# Patient Record
Sex: Female | Born: 1965 | Race: White | Hispanic: No | Marital: Married | Smoking: Never smoker
Health system: Southern US, Community
[De-identification: ages and names within clinical notes are randomized; demographics above are authoritative.]

## PROBLEM LIST (undated history)

## (undated) DIAGNOSIS — U071 COVID-19: Secondary | ICD-10-CM

## (undated) DIAGNOSIS — K760 Fatty (change of) liver, not elsewhere classified: Secondary | ICD-10-CM

## (undated) DIAGNOSIS — R112 Nausea with vomiting, unspecified: Secondary | ICD-10-CM

## (undated) DIAGNOSIS — E7800 Pure hypercholesterolemia, unspecified: Secondary | ICD-10-CM

## (undated) DIAGNOSIS — K5730 Diverticulosis of large intestine without perforation or abscess without bleeding: Secondary | ICD-10-CM

## (undated) DIAGNOSIS — K8020 Calculus of gallbladder without cholecystitis without obstruction: Secondary | ICD-10-CM

## (undated) DIAGNOSIS — Z9889 Other specified postprocedural states: Secondary | ICD-10-CM

## (undated) DIAGNOSIS — K769 Liver disease, unspecified: Secondary | ICD-10-CM

## (undated) DIAGNOSIS — M2550 Pain in unspecified joint: Secondary | ICD-10-CM

## (undated) DIAGNOSIS — T7840XA Allergy, unspecified, initial encounter: Secondary | ICD-10-CM

## (undated) DIAGNOSIS — F419 Anxiety disorder, unspecified: Secondary | ICD-10-CM

## (undated) DIAGNOSIS — K5792 Diverticulitis of intestine, part unspecified, without perforation or abscess without bleeding: Secondary | ICD-10-CM

## (undated) DIAGNOSIS — Z973 Presence of spectacles and contact lenses: Secondary | ICD-10-CM

## (undated) DIAGNOSIS — R61 Generalized hyperhidrosis: Secondary | ICD-10-CM

## (undated) DIAGNOSIS — M1990 Unspecified osteoarthritis, unspecified site: Secondary | ICD-10-CM

## (undated) DIAGNOSIS — R7303 Prediabetes: Secondary | ICD-10-CM

## (undated) HISTORY — PX: ANTERIOR CRUCIATE LIGAMENT REPAIR: SHX115

## (undated) HISTORY — DX: Pure hypercholesterolemia, unspecified: E78.00

## (undated) HISTORY — DX: Liver disease, unspecified: K76.9

## (undated) HISTORY — DX: Anxiety disorder, unspecified: F41.9

## (undated) HISTORY — DX: Allergy, unspecified, initial encounter: T78.40XA

## (undated) HISTORY — DX: Generalized hyperhidrosis: R61

## (undated) HISTORY — DX: Pain in unspecified joint: M25.50

## (undated) HISTORY — PX: DILATION AND CURETTAGE OF UTERUS: SHX78

## (undated) HISTORY — PX: KNEE ARTHROSCOPY: SUR90

## (undated) HISTORY — PX: PLANTAR FASCIECTOMY: SUR600

## (undated) HISTORY — PX: KNEE SURGERY: SHX244

## (undated) HISTORY — DX: Fatty (change of) liver, not elsewhere classified: K76.0

## (undated) HISTORY — PX: LAPAROSCOPIC SIGMOID COLECTOMY: SHX5928

## (undated) HISTORY — DX: Calculus of gallbladder without cholecystitis without obstruction: K80.20

## (undated) HISTORY — PX: MOUTH SURGERY: SHX715

## (undated) HISTORY — DX: Diverticulosis of large intestine without perforation or abscess without bleeding: K57.30

---
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
## 1998-06-04 ENCOUNTER — Other Ambulatory Visit: Admission: RE | Admit: 1998-06-04 | Discharge: 1998-06-04 | Payer: Self-pay | Admitting: Gynecology

## 1999-03-18 HISTORY — PX: DILATION AND CURETTAGE OF UTERUS: SHX78

## 1999-07-18 ENCOUNTER — Ambulatory Visit (HOSPITAL_COMMUNITY): Admission: RE | Admit: 1999-07-18 | Discharge: 1999-07-18 | Payer: Self-pay | Admitting: Gynecology

## 2000-01-03 ENCOUNTER — Encounter: Payer: Self-pay | Admitting: Obstetrics and Gynecology

## 2000-01-03 ENCOUNTER — Ambulatory Visit (HOSPITAL_COMMUNITY): Admission: RE | Admit: 2000-01-03 | Discharge: 2000-01-03 | Payer: Self-pay | Admitting: Obstetrics and Gynecology

## 2000-03-12 ENCOUNTER — Ambulatory Visit (HOSPITAL_COMMUNITY): Admission: RE | Admit: 2000-03-12 | Discharge: 2000-03-12 | Payer: Self-pay | Admitting: Obstetrics and Gynecology

## 2000-03-23 ENCOUNTER — Ambulatory Visit (HOSPITAL_COMMUNITY): Admission: RE | Admit: 2000-03-23 | Discharge: 2000-03-23 | Payer: Self-pay | Admitting: Obstetrics and Gynecology

## 2000-03-31 ENCOUNTER — Other Ambulatory Visit: Admission: RE | Admit: 2000-03-31 | Discharge: 2000-03-31 | Payer: Self-pay | Admitting: Obstetrics and Gynecology

## 2000-06-05 ENCOUNTER — Inpatient Hospital Stay (HOSPITAL_COMMUNITY): Admission: AD | Admit: 2000-06-05 | Discharge: 2000-06-07 | Payer: Self-pay | Admitting: Obstetrics and Gynecology

## 2001-11-01 ENCOUNTER — Emergency Department (HOSPITAL_COMMUNITY): Admission: EM | Admit: 2001-11-01 | Discharge: 2001-11-02 | Payer: Self-pay | Admitting: Emergency Medicine

## 2001-11-02 ENCOUNTER — Encounter: Payer: Self-pay | Admitting: Emergency Medicine

## 2001-11-05 ENCOUNTER — Other Ambulatory Visit: Admission: RE | Admit: 2001-11-05 | Discharge: 2001-11-05 | Payer: Self-pay | Admitting: Obstetrics and Gynecology

## 2004-05-07 ENCOUNTER — Other Ambulatory Visit: Admission: RE | Admit: 2004-05-07 | Discharge: 2004-05-07 | Payer: Self-pay | Admitting: Obstetrics and Gynecology

## 2011-02-22 ENCOUNTER — Ambulatory Visit (INDEPENDENT_AMBULATORY_CARE_PROVIDER_SITE_OTHER): Payer: Managed Care, Other (non HMO)

## 2011-02-22 DIAGNOSIS — Z130 Encounter for screening for diseases of the blood and blood-forming organs and certain disorders involving the immune mechanism: Secondary | ICD-10-CM

## 2011-02-22 DIAGNOSIS — S51009A Unspecified open wound of unspecified elbow, initial encounter: Secondary | ICD-10-CM

## 2011-02-22 DIAGNOSIS — Z23 Encounter for immunization: Secondary | ICD-10-CM

## 2012-03-26 ENCOUNTER — Ambulatory Visit (INDEPENDENT_AMBULATORY_CARE_PROVIDER_SITE_OTHER): Payer: Managed Care, Other (non HMO) | Admitting: Family Medicine

## 2012-03-26 ENCOUNTER — Encounter: Payer: Self-pay | Admitting: Family Medicine

## 2012-03-26 VITALS — BP 106/78 | HR 58 | Temp 97.5°F | Resp 16 | Ht 65.0 in | Wt 193.6 lb

## 2012-03-26 DIAGNOSIS — J301 Allergic rhinitis due to pollen: Secondary | ICD-10-CM

## 2012-03-26 DIAGNOSIS — Z23 Encounter for immunization: Secondary | ICD-10-CM

## 2012-03-26 DIAGNOSIS — Z0000 Encounter for general adult medical examination without abnormal findings: Secondary | ICD-10-CM

## 2012-03-26 LAB — CBC WITH DIFFERENTIAL/PLATELET
Basophils Absolute: 0.1 10*3/uL (ref 0.0–0.1)
Basophils Relative: 1 % (ref 0–1)
Eosinophils Absolute: 0.3 10*3/uL (ref 0.0–0.7)
Eosinophils Relative: 3 % (ref 0–5)
HCT: 40.3 % (ref 36.0–46.0)
Hemoglobin: 13.8 g/dL (ref 12.0–15.0)
Lymphocytes Relative: 18 % (ref 12–46)
Lymphs Abs: 1.8 10*3/uL (ref 0.7–4.0)
MCH: 29.2 pg (ref 26.0–34.0)
MCHC: 34.2 g/dL (ref 30.0–36.0)
MCV: 85.2 fL (ref 78.0–100.0)
Monocytes Absolute: 0.8 10*3/uL (ref 0.1–1.0)
Monocytes Relative: 8 % (ref 3–12)
Neutro Abs: 7 10*3/uL (ref 1.7–7.7)
Neutrophils Relative %: 70 % (ref 43–77)
Platelets: 282 10*3/uL (ref 150–400)
RBC: 4.73 MIL/uL (ref 3.87–5.11)
RDW: 13.9 % (ref 11.5–15.5)
WBC: 10 10*3/uL (ref 4.0–10.5)

## 2012-03-26 LAB — LIPID PANEL
Cholesterol: 194 mg/dL (ref 0–200)
HDL: 44 mg/dL (ref >39)
LDL Cholesterol: 123 mg/dL — ABNORMAL HIGH (ref 0–99)
Total CHOL/HDL Ratio: 4.4 Ratio
Triglycerides: 136 mg/dL (ref <150)
VLDL: 27 mg/dL (ref 0–40)

## 2012-03-26 LAB — COMPREHENSIVE METABOLIC PANEL
ALT: 18 U/L (ref 0–35)
AST: 18 U/L (ref 0–37)
Albumin: 4.6 g/dL (ref 3.5–5.2)
Alkaline Phosphatase: 67 U/L (ref 39–117)
BUN: 9 mg/dL (ref 6–23)
CO2: 27 mEq/L (ref 19–32)
Calcium: 9.8 mg/dL (ref 8.4–10.5)
Chloride: 98 mEq/L (ref 96–112)
Creat: 0.87 mg/dL (ref 0.50–1.10)
Glucose, Bld: 85 mg/dL (ref 70–99)
Potassium: 3.9 mEq/L (ref 3.5–5.3)
Sodium: 134 mEq/L — ABNORMAL LOW (ref 135–145)
Total Bilirubin: 0.5 mg/dL (ref 0.3–1.2)
Total Protein: 7.6 g/dL (ref 6.0–8.3)

## 2012-03-26 LAB — TSH: TSH: 2.109 u[IU]/mL (ref 0.350–4.500)

## 2012-03-26 MED ORDER — FLUTICASONE PROPIONATE 50 MCG/ACT NA SUSP: 2.0000 | Freq: Every day | NASAL | Status: DC

## 2012-03-26 NOTE — Progress Notes (Signed)
Subjective:    Patient ID: Sydney Alvarado, female    DOB: Mar 06, 1966, 47 y.o.   MRN: [id number]  HPI  Ms. Lotspeich is a pleasant 47 yo female here for a general exam for insurance purposes.  She is fasting today and has no complaints.   Sees gyn yearly- Dr. Senaida Ores - next appt with her is sched for April 2014.  She is still getting pap smears yearly and has no h/o abnormal.  Started mammogram at 47 yo due to exam abnmlity with nml [place] and has had yearly since then w/ no abnml - last was in August.  Is taking daily MVI.  Does get some dairy in through yogurt and cheese. TDaP was Dec 2011 and flu Dec 2012  Past Medical History  Diagnosis Date  . Allergy    Past Surgical History  Procedure Date  . Anterior cruciate ligament repair   Left acl repair x 2 and torn cartilage by Dr. Lajoyce Corners  Family History  Problem Relation Age of Onset  . Diabetes Mother   . Hypertension Father   . Hypertension Brother   . Hypertension Maternal Grandmother   . Heart disease Maternal Grandfather     heart attack  . Hypertension Paternal Grandmother   . Stroke Paternal Grandmother   . Cancer Paternal Grandfather   Mother w/ hypothyroid Pat GF w/ bone and stomach cancer at 45 yo. pgm HTN - brain aneurysmx x3 and CVA in 70s.  History  Substance Use Topics  . Smoking status: Never Smoker   . Smokeless tobacco: Not on file  . Alcohol Use: No  Work in Education officer, environmental. Has started going to the gym  Is married w/ 2 children - 1 yo son w/ severe allergies. [id number] - miscarriages.  Review of Systems  Constitutional: Negative.   HENT: Negative.        Ears itch from allergies  Eyes: Negative.   Respiratory: Negative.   Cardiovascular: Negative.   Gastrointestinal: Negative.   Genitourinary: Negative.   Musculoskeletal: Negative.   Neurological: Negative.   Hematological: Negative.   Psychiatric/Behavioral: Negative.   All other systems reviewed and are negative.      BP 106/78  Pulse 58  Temp 97.5 F  (36.4 C) (Oral)  Resp 16  Ht 5\' 5"  (1.651 m)  Wt 193 lb 9.6 oz (87.816 kg)  BMI 32.22 kg/m2  SpO2 98%  LMP 03/02/2012 Objective:   Physical Exam  Constitutional: She is oriented to person, place, and time. She appears well-developed and well-nourished. No distress.  HENT:  Head: Normocephalic and atraumatic.  Right Ear: Tympanic membrane, external ear and ear canal normal.  Left Ear: Tympanic membrane, external ear and ear canal normal.  Nose: Nose normal. No mucosal edema or rhinorrhea.  Mouth/Throat: Uvula is midline, oropharynx is clear and moist and mucous membranes are normal. No posterior oropharyngeal erythema.  Eyes: Conjunctivae normal and EOM are normal. Pupils are equal, round, and reactive to light. Right eye exhibits no discharge. Left eye exhibits no discharge. No scleral icterus.  Neck: Normal range of motion. Neck supple. No thyromegaly present.  Cardiovascular: Normal rate, regular rhythm, normal heart sounds and intact distal pulses.   Pulmonary/Chest: Effort normal and breath sounds normal. No respiratory distress.  Abdominal: Soft. Bowel sounds are normal. There is no tenderness.  Musculoskeletal: She exhibits no edema.  Lymphadenopathy:    She has no cervical adenopathy.  Neurological: She is alert and oriented to person, place, and time. She has normal reflexes.  She displays normal reflexes. She exhibits normal muscle tone. Coordination normal.  Skin: Skin is warm and dry. She is not diaphoretic. No erythema.  Psychiatric: She has a normal mood and affect. Her behavior is normal.       Assessment & Plan:  Allergies - refill flonase. If you want referral for immunotherapy, call. We will want copies of labs sent to Dr. Senaida Ores. HM - cbc, lipid, tsh, cmp.  Rec to pt that she is ok to spread out pap smears to 3 yr or 5 yrs w/ HPV co-testing. Rec spreading out mammograms to every other yr.  Cont mvi for ca and vit D supp. Cont exercise.

## 2012-03-26 NOTE — Progress Notes (Deleted)
  Subjective:    Patient ID: Sydney Alvarado, female    DOB: 1965/09/03, 47 y.o.   MRN: [id number]  HPI    Review of Systems  Constitutional: Negative.   HENT: Negative.   Eyes: Negative.   Respiratory: Negative.   Cardiovascular: Negative.   Gastrointestinal: Negative.   Genitourinary: Negative.   Musculoskeletal: Negative.   Skin: Negative.   Neurological: Negative.   Hematological: Negative.   Psychiatric/Behavioral: Negative.        Objective:   Physical Exam        Assessment & Plan:

## 2013-01-20 ENCOUNTER — Other Ambulatory Visit: Payer: Self-pay

## 2013-06-25 ENCOUNTER — Emergency Department (HOSPITAL_BASED_OUTPATIENT_CLINIC_OR_DEPARTMENT_OTHER)
Admission: EM | Admit: 2013-06-25 | Discharge: 2013-06-25 | Disposition: A | Discharge status: Home / Self Care | Payer: Managed Care, Other (non HMO) | Attending: Emergency Medicine | Admitting: Emergency Medicine

## 2013-06-25 ENCOUNTER — Emergency Department (HOSPITAL_BASED_OUTPATIENT_CLINIC_OR_DEPARTMENT_OTHER): Payer: Managed Care, Other (non HMO)

## 2013-06-25 ENCOUNTER — Encounter (HOSPITAL_BASED_OUTPATIENT_CLINIC_OR_DEPARTMENT_OTHER): Payer: Self-pay | Admitting: Emergency Medicine

## 2013-06-25 DIAGNOSIS — R209 Unspecified disturbances of skin sensation: Secondary | ICD-10-CM | POA: Insufficient documentation

## 2013-06-25 DIAGNOSIS — IMO0002 Reserved for concepts with insufficient information to code with codable children: Secondary | ICD-10-CM | POA: Insufficient documentation

## 2013-06-25 DIAGNOSIS — M25579 Pain in unspecified ankle and joints of unspecified foot: Secondary | ICD-10-CM | POA: Insufficient documentation

## 2013-06-25 DIAGNOSIS — Z87828 Personal history of other (healed) physical injury and trauma: Secondary | ICD-10-CM | POA: Insufficient documentation

## 2013-06-25 DIAGNOSIS — M25569 Pain in unspecified knee: Secondary | ICD-10-CM

## 2013-06-25 DIAGNOSIS — Z79899 Other long term (current) drug therapy: Secondary | ICD-10-CM | POA: Insufficient documentation

## 2013-06-25 DIAGNOSIS — Z9889 Other specified postprocedural states: Secondary | ICD-10-CM | POA: Insufficient documentation

## 2013-06-25 MED ORDER — OXYCODONE-ACETAMINOPHEN 5-325 MG PO TABS
1.0000 | ORAL_TABLET | Freq: Once | ORAL | Status: AC
  Administered 2013-06-25: 1 via ORAL
  Filled 2013-06-25: qty 1

## 2013-06-25 MED ORDER — OXYCODONE-ACETAMINOPHEN 5-325 MG PO TABS: 1.0000 | ORAL_TABLET | Freq: Four times a day (QID) | ORAL | Status: DC | PRN

## 2013-06-25 NOTE — ED Notes (Signed)
Pt reports hx of left knee acl repair in past- today was putting her legs up on couch and feels like something "went out"- states cannot bear weight or straighten leg

## 2013-06-25 NOTE — ED Provider Notes (Signed)
CSN: [id number]     Arrival date & time 06/25/13  1703 History  This chart was scribed for Threasa Beards, MD by Delphia Grates, ED Scribe. This patient was seen in room MH11/MH11 and the patient's care was started at [facility] PM.  Chief Complaint  Patient presents with  . Knee Pain    Patient is a 48 y.o. female presenting with knee pain. The history is provided by the patient. No language interpreter was used.  Knee Pain Location:  Knee Knee location:  L knee Pain details:    Quality:  Shooting   Severity:  Moderate   Timing:  Constant   Progression:  Unchanged Chronicity:  Recurrent Dislocation: no   Relieved by:  Immobilization Worsened by:  Flexion, activity and bearing weight Associated symptoms: decreased ROM, numbness and tingling   Associated symptoms: no fatigue and no fever   Risk factors: no known bone disorder and no obesity     HPI Comments: Sydney Alvarado is a 48 y.o. female with history of anterior cruciate ligament tear and surgical repair who presents to the Emergency Department complaining of constant, moderate, shooting anterior left knee pain that radiates down to her ankle. Patient states that pain began when she lifted her legs up off the floor to the couch. She reports history of similar symptoms and states the left knee "feels like it is out of place." There is associated numbness on the left side of her knee. She states that the pain is relieved with compression and worsened by flexion. She denies weakness in her extremities. Patient says that her last ACL surgery was 15-16 years ago with Dr. Sharol Given. She has not followed up with an orthopedist regarding her recurrent knee pain since that time. She is a nonsmoker and does not consume alcohol. She is allergic to sulfa drugs.  Past Medical History  Diagnosis Date  . Allergy    Past Surgical History  Procedure Laterality Date  . Anterior cruciate ligament repair    . Dilation and curettage of uterus    . Mouth  surgery     Family History  Problem Relation Age of Onset  . Diabetes Mother   . Hypertension Father   . Hypertension Brother   . Hypertension Maternal Grandmother   . Heart disease Maternal Grandfather     heart attack  . Hypertension Paternal Grandmother   . Stroke Paternal Grandmother   . Cancer Paternal Grandfather    History  Substance Use Topics  . Smoking status: Never Smoker   . Smokeless tobacco: Never Used  . Alcohol Use: No   OB History   Grav Para Term Preterm Abortions TAB SAB Ect Mult Living                 Review of Systems  Constitutional: Negative for fever and fatigue.  Musculoskeletal: Positive for arthralgias.  Neurological: Positive for numbness. Negative for weakness.  All other systems reviewed and are negative.   Allergies  Sulfa antibiotics  Home Medications   Current Outpatient Rx  Name  Route  Sig  Dispense  Refill  . mometasone (NASONEX) 50 MCG/ACT nasal spray   Nasal   Place 2 sprays into the nose daily.         . fexofenadine (ALLEGRA) 180 MG tablet   Oral   Take 180 mg by mouth daily.         . fluticasone (FLONASE) 50 MCG/ACT nasal spray   Nasal   Place 2  sprays into the nose daily.   16 g   11   . Multiple Vitamin (MULTIVITAMIN) tablet   Oral   Take 1 tablet by mouth daily.          Triage Vitals: BP 128/77  Pulse 73  Temp(Src) 98.1 F (36.7 C) (Oral)  Resp 24  Ht 5\' 5"  (1.651 m)  Wt 193 lb (87.544 kg)  BMI 32.12 kg/m2  SpO2 97%  LMP 06/11/2013 Physical Exam  Nursing note and vitals reviewed. Constitutional: She is oriented to person, place, and time. She appears well-developed and well-nourished. No distress.  HENT:  Head: Normocephalic and atraumatic.  Eyes: EOM are normal.  Neck: Neck supple. No tracheal deviation present.  Cardiovascular: Normal rate.   Pulmonary/Chest: Effort normal. No respiratory distress.  Musculoskeletal:  Well healed vertical surgical scar over the anterior left knee.  Tenderness to palpation diffusely of anterior knee. Holding knee in flexion. Mild laxity with anterior drawer testing. Left leg is distally neurovascularly intact.  Neurological: She is alert and oriented to person, place, and time.  Skin: Skin is warm and dry.  Psychiatric: She has a normal mood and affect. Her behavior is normal.    ED Course  Procedures (including critical care time) DIAGNOSTIC STUDIES: Oxygen Saturation is 97% on RA, normal by my interpretation.    COORDINATION OF CARE: 6:20 PM- Pt advised of plan for treatment and pt agrees.  Imaging Review Dg Knee Complete 4 Views Left  06/25/2013   CLINICAL DATA:  KNEE PAIN  EXAM: LEFT KNEE - COMPLETE 4+ VIEW  COMPARISON:  None.  FINDINGS: The left knee is bent limiting evaluation. With the positioning of the knee is not NPO evidence dislocation. There is no evidence of fracture. Patient is status post ACL repair.  IMPRESSION: In proper positioning due to limited range of motion. There does is not appear to be evidence of acute fracture nor dislocation.   Electronically Signed   By: Margaree Mackintosh M.D.   On: 06/25/2013 17:58    MDM   Final diagnoses:  Knee pain    Pt presenting with c/o knee pain- she has hx of ACL repair several years ago, pain onset with twisting movement- no significant trauma.  Xray reassuring.  May be ligamentous in nature.  Pt placed in knee immobilizer, given pain control. Pt states she brought crutches in the car with her.  Given information for ortho followup.  Discharged with strict return precautions.  Pt agreeable with plan.  I personally performed the services described in this documentation, which was scribed in my presence. The recorded information has been reviewed and is accurate.    Threasa Beards, MD 06/27/13 [phone]

## 2013-06-25 NOTE — Discharge Instructions (Signed)
Return to the ED with any concerns including increased pain, swelling/discoloration/numbness of leg, or any other alarming symptoms

## 2013-06-25 NOTE — ED Notes (Signed)
IV charted on incorrect patient

## 2014-03-17 HISTORY — PX: MOUTH SURGERY: SHX715

## 2015-02-01 ENCOUNTER — Encounter: Payer: Self-pay | Admitting: Physician Assistant

## 2015-02-01 ENCOUNTER — Ambulatory Visit (INDEPENDENT_AMBULATORY_CARE_PROVIDER_SITE_OTHER): Payer: Managed Care, Other (non HMO) | Admitting: Physician Assistant

## 2015-02-01 VITALS — BP 107/74 | HR 60 | Temp 98.4°F | Resp 17 | Ht 65.0 in | Wt 195.0 lb

## 2015-02-01 DIAGNOSIS — Z23 Encounter for immunization: Secondary | ICD-10-CM

## 2015-02-01 DIAGNOSIS — L0390 Cellulitis, unspecified: Secondary | ICD-10-CM

## 2015-02-01 DIAGNOSIS — L0291 Cutaneous abscess, unspecified: Secondary | ICD-10-CM | POA: Diagnosis not present

## 2015-02-01 MED ORDER — DOXYCYCLINE HYCLATE 100 MG PO CAPS: 100.0000 mg | ORAL_CAPSULE | Freq: Two times a day (BID) | ORAL | Status: AC

## 2015-02-01 NOTE — Patient Instructions (Signed)
Please take the antibiotic as prescribed. Remove the packing in 48 hours.  If the bandages get wet within the 48 hours, please remove the top dressing and place another dressing.  Try to leave the packing there. I will contact you with the result of the wound culture.  Please return if you are having any of the alarming symptoms listed below.  Incision and Drainage Incision and drainage is a procedure in which a sac-like structure (cystic structure) is opened and drained. The area to be drained usually contains material such as pus, fluid, or blood.  LET YOUR CAREGIVER KNOW ABOUT:   Allergies to medicine.  Medicines taken, including vitamins, herbs, eyedrops, over-the-counter medicines, and creams.  Use of steroids (by mouth or creams).  Previous problems with anesthetics or numbing medicines.  History of bleeding problems or blood clots.  Previous surgery.  Other health problems, including diabetes and kidney problems.  Possibility of pregnancy, if this applies. RISKS AND COMPLICATIONS  Pain.  Bleeding.  Scarring.  Infection. BEFORE THE PROCEDURE  You may need to have an ultrasound or other imaging tests to see how large or deep your cystic structure is. Blood tests may also be used to determine if you have an infection or how severe the infection is. You may need to have a tetanus shot. PROCEDURE  The affected area is cleaned with a cleaning fluid. The cyst area will then be numbed with a medicine (local anesthetic). A small incision will be made in the cystic structure. A syringe or catheter may be used to drain the contents of the cystic structure, or the contents may be squeezed out. The area will then be flushed with a cleansing solution. After cleansing the area, it is often gently packed with a gauze or another wound dressing. Once it is packed, it will be covered with gauze and tape or some other type of wound dressing. AFTER THE PROCEDURE   Often, you will be allowed  to go home right after the procedure.  You may be given antibiotic medicine to prevent or heal an infection.  If the area was packed with gauze or some other wound dressing, you will likely need to come back in 1 to 2 days to get it removed.  The area should heal in about 14 days.   This information is not intended to replace advice given to you by your health care provider. Make sure you discuss any questions you have with your health care provider.   Document Released: 08/27/2000 Document Revised: 09/02/2011 Document Reviewed: 04/28/2011 Elsevier Interactive Patient Education [employer].

## 2015-02-02 NOTE — Progress Notes (Signed)
[facility] [address], [place] [address] [phone]  Date:  02/01/2015   Name:  DISHA DEMPSEY   DOB:  Nov 26, 1965   MRN:  [id number]  PCP:  Logan Bores, MD    History of Present Illness:  MUBINA PRONOVOST is a 49 y.o. female patient who presents to [facility] for cc of bump at the right axilla.  Started on Monday as a small bump that was tender.  She has applied heat daily.  The bump has progressively worsened with increased swelling, and redness.  She has no fever, nausea, dizziness, or drainage.    There are no active problems to display for this patient.   Past Medical History  Diagnosis Date  . Allergy     Past Surgical History  Procedure Laterality Date  . Anterior cruciate ligament repair    . Dilation and curettage of uterus    . Mouth surgery      Social History  Substance Use Topics  . Smoking status: Never Smoker   . Smokeless tobacco: Never Used  . Alcohol Use: No    Family History  Problem Relation Age of Onset  . Diabetes Mother   . Hypertension Father   . Hypertension Brother   . Hypertension Maternal Grandmother   . Heart disease Maternal Grandfather     heart attack  . Hypertension Paternal Grandmother   . Stroke Paternal Grandmother   . Cancer Paternal Grandfather     Allergies  Allergen Reactions  . Sulfa Antibiotics     Type of reaction unknown    Medication list has been reviewed and updated.  Current Outpatient Prescriptions on File Prior to Visit  Medication Sig Dispense Refill  . fluticasone (FLONASE) 50 MCG/ACT nasal spray Place 2 sprays into the nose daily. 16 g 11  . Multiple Vitamin (MULTIVITAMIN) tablet Take 1 tablet by mouth daily.    . fexofenadine (ALLEGRA) 180 MG tablet Take 180 mg by mouth daily.    . mometasone (NASONEX) 50 MCG/ACT nasal spray Place 2 sprays into the nose daily.     No current facility-administered medications on file prior to visit.    ROS ROS otherwise unremarkable  unless listed above.  Physical Examination: BP 107/74 mmHg  Pulse 60  Temp(Src) 98.4 F (36.9 C) (Oral)  Resp 17  Ht 5\' 5"  (1.651 m)  Wt 195 lb (88.451 kg)  BMI 32.45 kg/m2  SpO2 97%  LMP 01/08/2015 Ideal Body Weight: Weight in (lb) to have BMI = 25: 149.9  Physical Exam  Constitutional: She is oriented to person, place, and time. She appears well-developed and well-nourished. No distress.  HENT:  Head: Normocephalic and atraumatic.  Right Ear: External ear normal.  Left Ear: External ear normal.  Eyes: Conjunctivae and EOM are normal. Pupils are equal, round, and reactive to light.  Cardiovascular: Normal rate.   Pulmonary/Chest: Effort normal. No respiratory distress.  Neurological: She is alert and oriented to person, place, and time.  Skin: She is not diaphoretic.  Right axilla with erythematous mildly fluctuant mobile swelling.  No expression with palpation.  No axillary lymphadenopathy.  Psychiatric: She has a normal mood and affect. Her behavior is normal.   Procedure: verbal consent obtained. Cleansed with alcohol swab.  1% lidocaine at axilla.  Cleansed with povidine.  11 blade used to incise wound.  Drainage expressed.  With continuous palpation, sebaceous material expressed, with sac products.  Searched with forceps.  1/4 packing placed.  Cleansed  with saline.  Dressings applied.   Assessment and Plan: JEANETT PERRY is a 49 y.o. female who is here today with right axilla bump.  Consistent with abscess.  This appears to be a sebaceous cyst that was infected.  This was removed. Wound culture obtained. Advised i and d care.  Remove the packing in 48 hours.  Cleansing with non-deodorant soap and water.  Alarming sxs to warrant return discussed.  Patient voiced understanding.  Abscess and cellulitis - Plan: doxycycline (VIBRAMYCIN) 100 MG capsule, Wound culture  Need for prophylactic vaccination and inoculation against influenza - Plan: Flu Vaccine QUAD 36+ mos  IM  Ivar Drape, PA-C [facility] 02/02/2015 5:56 AM

## 2015-02-04 LAB — WOUND CULTURE
GRAM STAIN: NONE SEEN
Gram Stain: NONE SEEN
ORGANISM ID, BACTERIA: NO GROWTH

## 2015-03-01 ENCOUNTER — Encounter: Payer: Self-pay | Admitting: Allergy and Immunology

## 2015-03-01 ENCOUNTER — Ambulatory Visit (INDEPENDENT_AMBULATORY_CARE_PROVIDER_SITE_OTHER): Payer: Managed Care, Other (non HMO) | Admitting: Allergy and Immunology

## 2015-03-01 VITALS — BP 110/80 | HR 75 | Temp 98.0°F | Resp 16 | Ht 64.17 in | Wt 192.9 lb

## 2015-03-01 DIAGNOSIS — J310 Chronic rhinitis: Secondary | ICD-10-CM

## 2015-03-01 DIAGNOSIS — R05 Cough: Secondary | ICD-10-CM

## 2015-03-01 DIAGNOSIS — R059 Cough, unspecified: Secondary | ICD-10-CM

## 2015-03-01 MED ORDER — OLOPATADINE HCL 0.6 % NA SOLN: 2.0000 | Freq: Every day | NASAL | Status: DC

## 2015-03-01 NOTE — Patient Instructions (Signed)
Take Home Sheet  1. Avoidance: Mite   2. Antihistamine: Zyrtec or Allegra by mouth once daily for runny nose as needed.   3. Nasal Spray: Flonase 2 spray(s) each nostril once daily for stuffy nose or drainage each morning.      Patanase 2 sprays each nostril once daily each evening for congestion.  4. Nasal Saline wash followed by nasal spray twice daily as directed.   5. Follow up Visit: 2 months or sooner if needed.   Websites that have reliable Patient information: 1. American Academy of Asthma, Allergy, & Immunology: www.aaaai.org 2. Food Allergy Network: www.foodallergy.org 3. Mothers of Asthmatics: www.aanma.org 4. [place]: DiningCalendar.de 5. American College of Allergy, Asthma, & Immunology: https://robertson.info/ or www.acaai.org

## 2015-03-01 NOTE — Progress Notes (Signed)
NEW PATIENT NOTE  RE: Sydney Alvarado MRN: [id number] DOB: [birth year] [facility] [address] [place] [address] Date of Office Visit: 03/01/2015  Referring provider: Paula Compton, MD [address], [place] [address]  Subjective:  Sydney Alvarado is a 49 y.o. female who presents today for Allergies  Assessment:   1. Chronic rhinitis, negative, selective aeroallergen testing today.   2. Cough, rare with normal lung exam.   Plan:   Meds ordered this encounter  Medications  . Olopatadine HCl 0.6 % SOLN    Sig: Place 2 drops (2 puffs total) into both nostrils daily.    Dispense:  3 Bottle    Refill:  1   Patient Instructions  1. Avoidance: Mite 2. Antihistamine: Zyrtec or Allegra by mouth once daily for runny nose as needed. 3. Nasal Spray: Flonase 2 spray(s) each nostril once daily for stuffy nose or drainage each morning.      Patanase 2 sprays each nostril once daily each evening for congestion. 4. Nasal Saline wash followed by nasal spray twice daily as directed. 5. Follow up Visit: 2 months or sooner if needed.  HPI: [name] presents with a 40 year history of mild spring nasal symptoms.  However, in the last 5 years,  symptoms have increased to year-round, including rhinorrhea, congestion, sneezing, itchy watery eyes, ear itching and pressure with postnasal drip, sinus pressure, and rare cough.  She reports snoring, having episodes of itchy throat without reflux, sinus infections or food/irritant sensitivities.  She typically describes dust, pollen, outdoors and fluctuant weather patterns are provoking factors for her symptoms.  Denies any systemic steroids, ED visits, hospitalizations, exercise or nocturnal difficulty.  Medications have been a partial benefit--Zyrtec and Flonase whereas Claritin was not beneficial.  And reports course of antibiotics in November.  Medical History: Past Medical History  Diagnosis Date  .  Allergy    Surgical History: Past Surgical History  Procedure Laterality Date  . Anterior cruciate ligament repair    . Dilation and curettage of uterus    . Mouth surgery     Family History: Family History  Problem Relation Age of Onset  . Diabetes Mother   . Hypertension Father   . Hypertension Brother   . Hypertension Maternal Grandmother   . Heart disease Maternal Grandfather     heart attack  . Hypertension Paternal Grandmother   . Stroke Paternal Grandmother   . Cancer Paternal Grandfather    Social History: Social History  . Marital Status: Married    Spouse Name: N/A  . Number of Children: N/A  . Years of Education: N/A   Occupational History  . Not on file.   Social History Main Topics  . Smoking status: Never Smoker   . Smokeless tobacco: Never Used  . Alcohol Use: No  . Drug Use: No  . 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  Medications prior to this encounter: Outpatient Prescriptions Prior to Visit  Medication Sig Dispense Refill  . fluticasone (FLONASE) 50 MCG/ACT nasal spray Place 2 sprays into the nose daily. 16 g 11  . Multiple Vitamin (MULTIVITAMIN) tablet Take 1 tablet by mouth daily.    . fexofenadine (ALLEGRA) 180 MG tablet Take 180 mg by mouth daily. Reported on 03/01/2015    . loratadine (CLARITIN) 10 MG tablet Take 10  mg by mouth daily. Reported on 03/01/2015    . mometasone (NASONEX) 50 MCG/ACT nasal spray Place 2 sprays into the nose daily. Reported on 03/01/2015     No facility-administered medications prior to visit.   Drug Allergies: Allergies  Allergen Reactions  . Sulfa Antibiotics     Type of reaction unknown   Environmental History: [name] lives in a 47 year old house 16 years with word, tile and carpeted floors, central air and heat without humidifier, or smokers.  Indoor dog.  Stuffed mattress  non-feather pillow and comforter.  Review of Systems  Constitutional: Negative for fever, weight loss and malaise/fatigue.       History of shingles.  HENT: Positive for congestion. Negative for ear pain, hearing loss, nosebleeds and sore throat.   Eyes: Negative for discharge and redness.       Reading glasses.  Respiratory: Negative for shortness of breath.        Denies history of pneumonia , and rare history of bronchitis..  Gastrointestinal: Negative for heartburn, nausea, vomiting, abdominal pain, diarrhea and constipation.  Genitourinary: Negative.   Musculoskeletal: Negative for myalgias and joint pain.  Skin: Negative.  Negative for itching and rash.  Neurological: Negative.  Negative for dizziness, seizures, weakness and headaches.  Endo/Heme/Allergies: Positive for environmental allergies.       Denies sensitivity to aspirin, NSAIDs, stinging insects, foods, latex, jewelry and cosmetics.    Objective:   Filed Vitals:   03/01/15 1005  BP: 110/80  Pulse: 75  Temp: 98 F (36.7 C)  Resp: 16   Physical Exam  Constitutional: She is well-developed, well-nourished, and in no distress.  Alert interactive communicating easily with nasal voice.  HENT:  Head: Atraumatic.  Right Ear: Tympanic membrane and ear canal normal.  Left Ear: Tympanic membrane and ear canal normal.  Nose: Mucosal edema present. No rhinorrhea. No epistaxis.  Mouth/Throat: Oropharynx is clear and moist and mucous membranes are normal. No oropharyngeal exudate, posterior oropharyngeal edema or posterior oropharyngeal erythema.  Eyes: Conjunctivae are normal.  Neck: Neck supple.  Cardiovascular: Normal rate, S1 normal and S2 normal.   No murmur heard. Pulmonary/Chest: Effort normal. She has no wheezes. She has no rhonchi. She has no rales.  Abdominal: Soft. Normal appearance and bowel sounds are normal.  Musculoskeletal: She exhibits no edema.  Lymphadenopathy:    She has no cervical adenopathy.   Neurological: She is alert.  Skin: Skin is warm and intact. No rash noted. No cyanosis. Nails show no clubbing.   Diagnostics: Skin testing: Negative to grass, weed and tree pollens, mold species and indoor inhalants as well selected foods.    Roselyn M. Ishmael Holter, MD   cc: Logan Bores, MD

## 2015-06-12 ENCOUNTER — Encounter: Payer: Self-pay | Admitting: Family Medicine

## 2015-06-12 ENCOUNTER — Ambulatory Visit (INDEPENDENT_AMBULATORY_CARE_PROVIDER_SITE_OTHER): Payer: Managed Care, Other (non HMO) | Admitting: Family Medicine

## 2015-06-12 VITALS — BP 123/81 | HR 55 | Temp 98.3°F | Resp 16 | Ht 64.75 in | Wt 196.2 lb

## 2015-06-12 DIAGNOSIS — Z6832 Body mass index (BMI) 32.0-32.9, adult: Secondary | ICD-10-CM | POA: Diagnosis not present

## 2015-06-12 DIAGNOSIS — Z131 Encounter for screening for diabetes mellitus: Secondary | ICD-10-CM

## 2015-06-12 DIAGNOSIS — Z114 Encounter for screening for human immunodeficiency virus [HIV]: Secondary | ICD-10-CM | POA: Diagnosis not present

## 2015-06-12 DIAGNOSIS — Z23 Encounter for immunization: Secondary | ICD-10-CM

## 2015-06-12 DIAGNOSIS — Z1329 Encounter for screening for other suspected endocrine disorder: Secondary | ICD-10-CM

## 2015-06-12 DIAGNOSIS — Z0000 Encounter for general adult medical examination without abnormal findings: Secondary | ICD-10-CM | POA: Diagnosis not present

## 2015-06-12 DIAGNOSIS — Z13220 Encounter for screening for lipoid disorders: Secondary | ICD-10-CM

## 2015-06-12 DIAGNOSIS — E669 Obesity, unspecified: Secondary | ICD-10-CM

## 2015-06-12 LAB — POCT URINALYSIS DIP (MANUAL ENTRY)
BILIRUBIN UA: NEGATIVE
Glucose, UA: NEGATIVE
Ketones, POC UA: NEGATIVE
Leukocytes, UA: NEGATIVE
Nitrite, UA: NEGATIVE
Protein Ur, POC: NEGATIVE
RBC UA: NEGATIVE
SPEC GRAV UA: 1.02
UROBILINOGEN UA: 0.2
pH, UA: 8.5

## 2015-06-12 LAB — CBC WITH DIFFERENTIAL/PLATELET
Basophils Absolute: 0.1 10*3/uL (ref 0.0–0.1)
Basophils Relative: 1 % (ref 0–1)
EOS PCT: 4 % (ref 0–5)
Eosinophils Absolute: 0.4 10*3/uL (ref 0.0–0.7)
HEMATOCRIT: 41.5 % (ref 36.0–46.0)
Hemoglobin: 13.8 g/dL (ref 12.0–15.0)
Lymphocytes Relative: 19 % (ref 12–46)
Lymphs Abs: 1.7 10*3/uL (ref 0.7–4.0)
MCH: 28.8 pg (ref 26.0–34.0)
MCHC: 33.3 g/dL (ref 30.0–36.0)
MCV: 86.5 fL (ref 78.0–100.0)
MONO ABS: 0.5 10*3/uL (ref 0.1–1.0)
MPV: 13.3 fL — AB (ref 8.6–12.4)
Monocytes Relative: 6 % (ref 3–12)
NEUTROS ABS: 6.4 10*3/uL (ref 1.7–7.7)
Neutrophils Relative %: 70 % (ref 43–77)
Platelets: 264 10*3/uL (ref 150–400)
RBC: 4.8 MIL/uL (ref 3.87–5.11)
RDW: 14 % (ref 11.5–15.5)
WBC: 9.1 10*3/uL (ref 4.0–10.5)

## 2015-06-12 MED ORDER — FLUOCINOLONE ACETONIDE 0.01 % OT OIL: 1.0000 "application " | TOPICAL_OIL | Freq: Two times a day (BID) | OTIC | Status: DC

## 2015-06-12 NOTE — Patient Instructions (Addendum)
IF you received an x-ray today, you will receive an invoice from [facility]. Please contact [facility] at [phone] with questions or concerns regarding your invoice.   IF you received labwork today, you will receive an invoice from [employer]. Please contact [facility] at [phone] with questions or concerns regarding your invoice.   Our billing staff will not be able to assist you with questions regarding bills from these companies.  You will be contacted with the lab results as soon as they are available. The fastest way to get your results is to activate your My Chart account. Instructions are located on the last page of this paperwork. If you have not heard from [place] regarding the results in 2 weeks, please contact this office.     [email or web address]  Keeping You Healthy  Get These Tests 1. Blood Pressure- Have your blood pressure checked once a year by your health care provider.  Normal blood pressure is 120/80. 2. Weight- Have your body mass index (BMI) calculated to screen for obesity.  BMI is measure of body fat based on height and weight.  You can also calculate your own BMI at GravelBags.it. 3. Cholesterol- Have your cholesterol checked every 5 years starting at age 63 then yearly starting at age 62. 21. Chlamydia, HIV, and other sexually transmitted diseases- Get screened every year until age 95, then within three months of each new sexual provider. 5. Pap Test - Every 1-5 years; discuss with your health care provider. 6. Mammogram- Every 1-2 years starting at age 6--50  Take these medicines  Calcium with Vitamin D-Your body needs 1200 mg of Calcium each day and [id number] IU of Vitamin D daily.  Your body can only absorb 500 mg of Calcium at a time so Calcium must be taken in 2 or 3 divided doses throughout the day.  Multivitamin with folic acid- Once daily if it is possible for you to become pregnant.  Get  these Immunizations  Gardasil-Series of three doses; prevents HPV related illness such as genital warts and cervical cancer.  Menactra-Single dose; prevents meningitis.  Tetanus shot- Every 10 years.  Flu shot-Every year.  Take these steps 1. Do not smoke-Your healthcare provider can help you quit.  For tips on how to quit go to www.smokefree.gov or call 1-800 QUITNOW. 2. Be physically active- Exercise 5 days a week for at least 30 minutes.  If you are not already physically active, start slow and gradually work up to 30 minutes of moderate physical activity.  Examples of moderate activity include walking briskly, dancing, swimming, bicycling, etc. 3. Breast Cancer- A self breast exam every month is important for early detection of breast cancer.  For more information and instruction on self breast exams, ask your healthcare provider or https://www.patel.info/. 4. Eat a healthy diet- Eat a variety of healthy foods such as fruits, vegetables, whole grains, low fat milk, low fat cheeses, yogurt, lean meats, poultry and fish, beans, nuts, tofu, etc.  For more information go to www. Thenutritionsource.org 5. Drink alcohol in moderation- Limit alcohol intake to one drink or less per day. Never drink and drive. 6. Depression- Your emotional health is as important as your physical health.  If you're feeling down or losing interest in things you normally enjoy please talk to your healthcare provider about being screened for depression. 7. Dental visit- Brush and floss your teeth twice daily; visit your dentist twice a year. 8. Eye doctor- Get an eye exam at  least every 2 years. 9. Helmet use- Always wear a helmet when riding a bicycle, motorcycle, rollerblading or skateboarding. 69. Safe sex- If you may be exposed to sexually transmitted infections, use a condom. 11. Seat belts- Seat belts can save your live; always wear one. 12. Smoke/Carbon Monoxide detectors- These detectors  need to be installed on the appropriate level of your home. Replace batteries at least once a year. 13. Skin cancer- When out in the sun please cover up and use sunscreen 15 SPF or higher. 14. Violence- If anyone is threatening or hurting you, please tell your healthcare provider.

## 2015-06-12 NOTE — Progress Notes (Signed)
Subjective:    Patient ID: Sydney Alvarado, female    DOB: Dec 21, 1965, 50 y.o.   MRN: [id number]  06/12/2015  Annual Exam   HPI This 50 y.o. female presents for Complete Physical Examination.  Last physical:  2-14 Pap smear:  2014 gynecology; [facility];  [facility]; regular menses; every 33 days. Mammogram:  02/2015 at [facility].  Subcutaneous cyst. Colonoscopy: never Bone density:  Never but wants to discussed.  TDAP:  Not sure.   Influenza:  02-01-2015 Eye exam:  Last year; reading glasses.   Dental exam:  Every six months.    Grandmother with osteoprosis; maternal grandmother is 53 years old; mother with osteoporosis; brother with hip resurfacing and pre-osteoporosis.  Has discussed with gynecologist.  Lack of weight loss: can exercise five days per week; will take three months to lose 8 pounds.    Blood pressure: 114-117/65-74. Gets anxious when comes to office.  B: sweet tea 18 ounces Snack: apple or Belvita Lunch: Lean Cuisine or protein shake, water Snack:  Supper: meat, vegetable.    No candy; rare desserts.     Review of Systems  Constitutional: Negative for fever, chills, diaphoresis, activity change, appetite change, fatigue and unexpected weight change.  HENT: Negative for congestion, dental problem, drooling, ear discharge, ear pain, facial swelling, hearing loss, mouth sores, nosebleeds, postnasal drip, rhinorrhea, sinus pressure, sneezing, sore throat, tinnitus, trouble swallowing and voice change.   Eyes: Negative for photophobia, pain, discharge, redness, itching and visual disturbance.  Respiratory: Negative for apnea, cough, choking, chest tightness, shortness of breath, wheezing and stridor.   Cardiovascular: Negative for chest pain, palpitations and leg swelling.  Gastrointestinal: Negative for nausea, vomiting, abdominal pain, diarrhea, constipation, blood in stool, abdominal distention, anal bleeding and rectal pain.  Endocrine: Negative  for cold intolerance, heat intolerance, polydipsia, polyphagia and polyuria.  Genitourinary: Negative for dysuria, urgency, frequency, hematuria, flank pain, decreased urine volume, vaginal bleeding, vaginal discharge, enuresis, difficulty urinating, genital sores, vaginal pain, menstrual problem, pelvic pain and dyspareunia.  Musculoskeletal: Negative for myalgias, back pain, joint swelling, arthralgias, gait problem, neck pain and neck stiffness.  Skin: Negative for color change, pallor, rash and wound.  Allergic/Immunologic: Negative for environmental allergies, food allergies and immunocompromised state.  Neurological: Negative for dizziness, tremors, seizures, syncope, facial asymmetry, speech difficulty, weakness, light-headedness, numbness and headaches.  Hematological: Negative for adenopathy. Does not bruise/bleed easily.  Psychiatric/Behavioral: Negative for suicidal ideas, hallucinations, behavioral problems, confusion, sleep disturbance, self-injury, dysphoric mood, decreased concentration and agitation. The patient is not nervous/anxious and is not hyperactive.     Past Medical History  Diagnosis Date  . Allergy    Past Surgical History  Procedure Laterality Date  . Anterior cruciate ligament repair    . Dilation and curettage of uterus    . Mouth surgery     Allergies  Allergen Reactions  . Sulfa Antibiotics     Type of reaction unknown   Current Outpatient Prescriptions  Medication Sig Dispense Refill  . Calcium Carb-Cholecalciferol (CALCIUM 500 + D3 PO) Take by mouth daily.    . fexofenadine (ALLEGRA) 180 MG tablet Take 180 mg by mouth daily. Reported on 03/01/2015    . fluticasone (FLONASE) 50 MCG/ACT nasal spray Place 2 sprays into the nose daily. 16 g 11  . Multiple Vitamin (MULTIVITAMIN) tablet Take 1 tablet by mouth daily.    . Fluocinolone Acetonide 0.01 % OIL Place 1 application in ear(s) 2 (two) times daily. 20 mL 0   No  current facility-administered  medications for this visit.   Social History   Social History  . Marital Status: Married    Spouse Name: N/A  . Number of Children: N/A  . Years of Education: N/A   Occupational History  . system analyst    Social History Main Topics  . Smoking status: Never Smoker   . Smokeless tobacco: Never Used  . Alcohol Use: No  . Drug Use: No  . Sexual Activity: Yes   Other Topics Concern  . Not on file   Social History Narrative   Marital status: married x 29 years.      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      Seatbelt: 100%; no texting   Family History  Problem Relation Age of Onset  . Diabetes Mother   . Hypertension Mother   . Hypertension Father   . Cancer Father     skin cancer  . Hypertension Brother   . Hypertension Maternal Grandmother   . Heart disease Maternal Grandfather     heart attack  . Hypertension Paternal Grandmother   . Stroke Paternal Grandmother   . Cancer Paternal Grandfather        Objective:    BP 123/81 mmHg  Pulse 55  Temp(Src) 98.3 F (36.8 C) (Oral)  Resp 16  Ht 5' 4.75" (1.645 m)  Wt 196 lb 3.2 oz (88.996 kg)  BMI 32.89 kg/m2  LMP 05/16/2015 Physical Exam  Constitutional: She is oriented to person, place, and time. She appears well-developed and well-nourished. No distress.  HENT:  Head: Normocephalic and atraumatic.  Right Ear: External ear normal.  Left Ear: External ear normal.  Nose: Nose normal.  Mouth/Throat: Oropharynx is clear and moist.  Eyes: Conjunctivae and EOM are normal. Pupils are equal, round, and reactive to light.  Neck: Normal range of motion and full passive range of motion without pain. Neck supple. No JVD present. Carotid bruit is not  present. No thyromegaly present.  Cardiovascular: Normal rate, regular rhythm and normal heart sounds.  Exam reveals no gallop and no friction rub.   No murmur heard. Pulmonary/Chest: Effort normal and breath sounds normal. She has no wheezes. She has no rales.  Abdominal: Soft. Bowel sounds are normal. She exhibits no distension and no mass. There is no tenderness. There is no rebound and no guarding.  Musculoskeletal:       Right shoulder: Normal.       Left shoulder: Normal.       Cervical back: Normal.  Lymphadenopathy:    She has no cervical adenopathy.  Neurological: She is alert and oriented to person, place, and time. She has normal reflexes. No cranial nerve deficit. She exhibits normal muscle tone. Coordination normal.  Skin: Skin is warm and dry. No rash noted. She is not diaphoretic. No erythema. No pallor.  Psychiatric: She has a normal mood and affect. Her behavior is normal. Judgment and thought content normal.  Nursing note and vitals reviewed.       Assessment & Plan:   1. Routine physical examination  2. Screening for diabetes mellitus   3. Screening, lipid   4. Screening for thyroid disorder   5. Screening for HIV (human immunodeficiency virus)   6. Need for Tdap vaccination     Orders Placed This Encounter  Procedures  . Tdap vaccine greater than or equal to 7yo IM  . CBC with Differential/Platelet  . Comprehensive metabolic panel    Order Specific Question:  Has the patient fasted?    Answer:  Yes  . Hemoglobin A1c  . Lipid panel    Order Specific Question:  Has the patient fasted?    Answer:  Yes  . TSH  . HIV antibody  . POCT urinalysis dipstick   Meds ordered this encounter  Medications  . Calcium Carb-Cholecalciferol (CALCIUM 500 + D3 PO)    Sig: Take by mouth daily.  . Fluocinolone Acetonide 0.01 % OIL    Sig: Place 1 application in ear(s) 2 (two) times daily.    Dispense:  20 mL    Refill:  0    No Follow-up on file.    Aunisty Reali  Paulita Fujita, M.D. [facility] [address] [place], [place]  [address] [phone] phone [phone] fax

## 2015-06-13 LAB — COMPREHENSIVE METABOLIC PANEL
ALBUMIN: 4.4 g/dL (ref 3.6–5.1)
ALT: 16 U/L (ref 6–29)
AST: 18 U/L (ref 10–35)
Alkaline Phosphatase: 65 U/L (ref 33–115)
BILIRUBIN TOTAL: 0.4 mg/dL (ref 0.2–1.2)
BUN: 5 mg/dL — ABNORMAL LOW (ref 7–25)
CO2: 24 mmol/L (ref 20–31)
CREATININE: 0.8 mg/dL (ref 0.50–1.10)
Calcium: 9.7 mg/dL (ref 8.6–10.2)
Chloride: 103 mmol/L (ref 98–110)
GLUCOSE: 89 mg/dL (ref 65–99)
Potassium: 4.3 mmol/L (ref 3.5–5.3)
SODIUM: 138 mmol/L (ref 135–146)
Total Protein: 7.8 g/dL (ref 6.1–8.1)

## 2015-06-13 LAB — HEMOGLOBIN A1C
Hgb A1c MFr Bld: 5.8 % — ABNORMAL HIGH (ref <5.7)
Mean Plasma Glucose: 120 mg/dL

## 2015-06-13 LAB — LIPID PANEL
Cholesterol: 186 mg/dL (ref 125–200)
HDL: 44 mg/dL — ABNORMAL LOW (ref >=46)
LDL CALC: 91 mg/dL (ref <130)
Total CHOL/HDL Ratio: 4.2 Ratio (ref <=5.0)
Triglycerides: 255 mg/dL — ABNORMAL HIGH (ref <150)
VLDL: 51 mg/dL — AB (ref <30)

## 2015-06-13 LAB — HIV ANTIBODY (ROUTINE TESTING W REFLEX): HIV: NONREACTIVE

## 2015-06-13 LAB — TSH: TSH: 2.2 m[IU]/L

## 2015-10-09 ENCOUNTER — Ambulatory Visit (INDEPENDENT_AMBULATORY_CARE_PROVIDER_SITE_OTHER): Payer: Managed Care, Other (non HMO) | Admitting: Physician Assistant

## 2015-10-09 VITALS — BP 128/90 | HR 82 | Temp 98.5°F | Resp 18 | Ht 64.75 in | Wt 194.0 lb

## 2015-10-09 DIAGNOSIS — H109 Unspecified conjunctivitis: Secondary | ICD-10-CM

## 2015-10-09 MED ORDER — POLYMYXIN B-TRIMETHOPRIM 10000-0.1 UNIT/ML-% OP SOLN: 1.0000 [drp] | OPHTHALMIC | 0 refills | Status: DC

## 2015-10-09 NOTE — Patient Instructions (Signed)
     IF you received an x-ray today, you will receive an invoice from [facility]. Please contact [facility] at [phone] with questions or concerns regarding your invoice.   IF you received labwork today, you will receive an invoice from [facility]. Please contact [facility] at [phone] with questions or concerns regarding your invoice.   Our billing staff will not be able to assist you with questions regarding bills from these companies.  You will be contacted with the lab results as soon as they are available. The fastest way to get your results is to activate your My Chart account. Instructions are located on the last page of this paperwork. If you have not heard from us regarding the results in 2 weeks, please contact this office.      

## 2015-10-09 NOTE — Progress Notes (Signed)
   10/10/2015 9:19 AM   DOB: January 04, 1966 / MRN: [id number]  SUBJECTIVE:  Sydney Alvarado is a 50 y.o. female presenting for left sided eye irritation and redness that seems to have resolved earlier today per her report.  She associates copious drainage from the eye and matting in the morning.  She denies change in vision, photophobia, contact lens use, HA, and frank eye pain.   She is allergic to sulfa antibiotics.   She  has a past medical history of Allergy.    She  reports that she has never smoked. She has never used smokeless tobacco. She reports that she does not drink alcohol or use drugs. She  reports that she currently engages in sexual activity. The patient  has a past surgical history that includes Anterior cruciate ligament repair; Dilation and curettage of uterus; and Mouth surgery.  Her family history includes Cancer in her father and paternal grandfather; Diabetes in her mother; Heart disease in her maternal grandfather; Hypertension in her brother, father, maternal grandmother, mother, and paternal grandmother; Stroke in her paternal grandmother.  ROS  Per HPI.   Problem list and medications reviewed and updated by myself where necessary, and exist elsewhere in the encounter.   OBJECTIVE:  BP 128/90   Pulse 82   Temp 98.5 F (36.9 C) (Oral)   Resp 18   Ht 5' 4.75" (1.645 m)   Wt 194 lb (88 kg)   LMP 10/06/2015   SpO2 96%   BMI 32.53 kg/m   Physical Exam  Constitutional: She is oriented to person, place, and time. She appears well-nourished. No distress.  Eyes: EOM are normal. Pupils are equal, round, and reactive to light. Right eye exhibits no discharge and no exudate. Left eye exhibits no discharge and no exudate. Right conjunctiva is not injected. Right conjunctiva has no hemorrhage. Left conjunctiva is not injected. Left conjunctiva has no hemorrhage.  Fundoscopic exam:      The right eye shows no exudate and no hemorrhage. The right eye shows no red reflex.    The left eye shows no exudate and no hemorrhage. The left eye shows no red reflex.  Cardiovascular: Normal rate.   Pulmonary/Chest: Effort normal.  Abdominal: She exhibits no distension.  Neurological: She is alert and oriented to person, place, and time. No cranial nerve deficit. Gait normal.  Skin: Skin is dry. She is not diaphoretic.  Psychiatric: She has a normal mood and affect.  Vitals reviewed.     No results found for this or any previous visit (from the past 72 hour(s)).  No results found.  ASSESSMENT AND PLAN  Sydney Alvarado was seen today for eye drainage and facial swelling.  Diagnoses and all orders for this visit:  Conjunctivitis of left eye: Resolving.  ABX only if worsening or moving to the other eye.  -     trimethoprim-polymyxin b (POLYTRIM) ophthalmic solution; Place 1 drop into the right eye every 4 (four) hours.    The patient was advised to call or return to clinic if she does not see an improvement in symptoms, or to seek the care of the closest emergency department if she worsens with the above plan.   Philis Fendt, MHS, PA-C [facility] 10/10/2015 9:19 AM

## 2016-02-29 LAB — HM MAMMOGRAPHY

## 2016-11-13 LAB — HM COLONOSCOPY

## 2016-12-11 ENCOUNTER — Ambulatory Visit (INDEPENDENT_AMBULATORY_CARE_PROVIDER_SITE_OTHER): Payer: 59

## 2016-12-11 ENCOUNTER — Ambulatory Visit (INDEPENDENT_AMBULATORY_CARE_PROVIDER_SITE_OTHER): Payer: 59 | Admitting: Podiatry

## 2016-12-11 ENCOUNTER — Encounter: Payer: Self-pay | Admitting: Podiatry

## 2016-12-11 VITALS — BP 130/82 | HR 62

## 2016-12-11 DIAGNOSIS — M659 Synovitis and tenosynovitis, unspecified: Secondary | ICD-10-CM | POA: Diagnosis not present

## 2016-12-11 DIAGNOSIS — M722 Plantar fascial fibromatosis: Secondary | ICD-10-CM

## 2016-12-11 DIAGNOSIS — M79671 Pain in right foot: Secondary | ICD-10-CM | POA: Diagnosis not present

## 2016-12-11 DIAGNOSIS — M79672 Pain in left foot: Secondary | ICD-10-CM

## 2016-12-11 MED ORDER — DEXAMETHASONE SODIUM PHOSPHATE 120 MG/30ML IJ SOLN
4.0000 mg | Freq: Once | INTRAMUSCULAR | Status: AC
  Administered 2016-12-11: 4 mg via INTRAMUSCULAR

## 2016-12-11 MED ORDER — MELOXICAM 15 MG PO TABS: 15.0000 mg | ORAL_TABLET | Freq: Every day | ORAL | 0 refills | Status: DC

## 2016-12-11 MED ORDER — TRIAMCINOLONE ACETONIDE 10 MG/ML IJ SUSP
5.0000 mg | Freq: Once | INTRAMUSCULAR | Status: AC
  Administered 2016-12-11: 5 mg

## 2016-12-11 NOTE — Progress Notes (Signed)
   Subjective:    Patient ID: Sydney Alvarado, female    DOB: September 06, 1965, 51 y.o.   MRN: [id number]  HPI  Chief Complaint  Patient presents with  . Foot Pain    b/l - bottom of heel x 6-8 months. Right worse than left.    51 y.o. female presents with the above complaint. Reports bilateral heel pain, R>L for 6-8 months. Pain worst in AM. Has tried shoe gear changes, stretching, and her husband's night splint without relief. Reports lesser pain in the back of both heels.  Past Medical History:  Diagnosis Date  . Allergy    Past Surgical History:  Procedure Laterality Date  . ANTERIOR CRUCIATE LIGAMENT REPAIR    . DILATION AND CURETTAGE OF UTERUS    . MOUTH SURGERY      Current Outpatient Prescriptions:  .  cetirizine (ZYRTEC) 10 MG tablet, Take 10 mg by mouth daily., Disp: , Rfl:  .  glycopyrrolate (ROBINUL) 1 MG tablet, Take 1 mg by mouth daily., Disp: , Rfl: 6 .  meloxicam (MOBIC) 15 MG tablet, Take 1 tablet (15 mg total) by mouth daily., Disp: 30 tablet, Rfl: 0  Allergies  Allergen Reactions  . Sulfa Antibiotics     Type of reaction unknown    Review of Systems negative except as noted in HPI.    Objective:   Physical Exam Vitals:   12/11/16 0917  BP: 130/82  Pulse: 62   General AA&O x3. Normal mood and affect.  Vascular Dorsalis pedis and posterior tibial pulses  present 2+ bilaterally  Capillary refill normal to all digits. Pedal hair growth normal.  Neurologic Epicritic sensation grossly present bilaterally.  Dermatologic No open lesions. Interspaces clear of maceration. Nails well groomed and normal in appearance.  Orthopedic: MMT 5/5 in dorsiflexion, plantarflexion, inversion, and eversion right. Tender to palpation at the calcaneal tuber bilaterally, R>L. No pain with calcaneal squeeze bilaterally. Ankle ROM diminished range of motion bilaterally. Silfverskiold Test: positive bilaterally.   Radiographs: Taken and reviewed. No acute fractures. No evidence of  calcaneal stress fracture.    Assessment & Plan:  Patient was evaluated and treated and all questions answered.  Plantar Fasciitis, bilaterally; R>L - XR reviewed as above.  - Educated on icing and stretching. Instructions given.  - Injection delivered as below. - eRx Meloxicam  Procedure: Injection Tendon/Ligament Location: Right plantar fascia at the glabrous junction; medial approach. Skin Prep: Alcohol. Injectate: 1 cc 0.5% marcaine plain, 1 cc dexamethasone phosphate, 0.5 cc kenalog 10. Disposition: Patient tolerated procedure well. Injection site dressed with a band-aid.

## 2016-12-11 NOTE — Patient Instructions (Signed)

## 2017-01-07 ENCOUNTER — Other Ambulatory Visit: Payer: Self-pay | Admitting: Podiatry

## 2017-01-07 NOTE — Telephone Encounter (Signed)
Pt needs an appt prior to future refills. 

## 2017-01-08 ENCOUNTER — Ambulatory Visit (INDEPENDENT_AMBULATORY_CARE_PROVIDER_SITE_OTHER): Payer: 59 | Admitting: Podiatry

## 2017-01-08 DIAGNOSIS — M722 Plantar fascial fibromatosis: Secondary | ICD-10-CM | POA: Diagnosis not present

## 2017-01-08 MED ORDER — MELOXICAM 15 MG PO TABS: 15.0000 mg | ORAL_TABLET | Freq: Every day | ORAL | 0 refills | Status: DC

## 2017-01-09 ENCOUNTER — Telehealth: Payer: Self-pay | Admitting: Podiatry

## 2017-01-09 MED ORDER — MELOXICAM 15 MG PO TABS: 15.0000 mg | ORAL_TABLET | Freq: Every day | ORAL | 0 refills | Status: DC

## 2017-01-09 NOTE — Addendum Note (Signed)
Addended by: Harriett Sine D on: 01/09/2017 02:37 PM   Modules accepted: Orders

## 2017-01-09 NOTE — Telephone Encounter (Signed)
Meloxicam rx escribed to [id number].

## 2017-01-09 NOTE — Telephone Encounter (Signed)
I saw Dr. March Rummage yesterday and he refilled my Meloxicam. However, it was sent to the wrong pharmacy. It was sent to [facility], and [facility]. [facility] need [facility] switched to [facility] [address]. If you need to speak to me, you can give me a call back at [phone]. Thank you.

## 2017-01-14 NOTE — Progress Notes (Addendum)
  Subjective:  Patient ID: Sydney Alvarado, female    DOB: 11-17-1965,  MRN: [id number]  51 y.o. female returns for follow-up plantar fasciitis.  States the injection lasted 2 weeks but her pain is returned.  Has been taking meloxicam.  Denies new issues  Objective:   There were no vitals filed for this visit. General AA&O x3. Normal mood and affect.  Vascular Pedal pulses palpable.  Neurologic Epicritic sensation grossly intact.  Dermatologic No open lesions. Skin normal texture and turgor.  Orthopedic: Pain to palpation R calc tuber plantarly.   Assessment & Plan:  Patient was evaluated and treated and all questions answered.  Plantar fasciitis right -Repeat injection delivered as below  Procedure: Injection Tendon/Ligament Location: Right plantar fascia at the glabrous junction; medial approach. Skin Prep: Alcohol. Injectate: 1 cc 0.5% marcaine plain, 1 cc dexamethasone phosphate, 0.5 cc kenalog 10. Disposition: Patient tolerated procedure well. Injection site dressed with a band-aid.     Return in about 3 weeks (around 01/29/2017) for plantar fasciitis f/u .

## 2017-01-30 ENCOUNTER — Ambulatory Visit: Payer: 59 | Admitting: Podiatry

## 2017-01-30 ENCOUNTER — Encounter: Payer: Self-pay | Admitting: Podiatry

## 2017-01-30 DIAGNOSIS — M722 Plantar fascial fibromatosis: Secondary | ICD-10-CM | POA: Diagnosis not present

## 2017-01-30 DIAGNOSIS — M659 Synovitis and tenosynovitis, unspecified: Secondary | ICD-10-CM

## 2017-01-30 NOTE — Progress Notes (Signed)
  Subjective:  Patient ID: Sydney Alvarado, female    DOB: 09-30-1965,  MRN: [id number]  Chief Complaint  Patient presents with  . Plantar Fasciitis    Right foot, still having intermittent pain. Has been doing exercises and taking meloxicam although not everyday.    51 y.o. female returns for the above complaint.,  Has been taking meloxicam but not every day.  States that her pain is about 25% what it was previously.  Objective:  There were no vitals filed for this visit. General AA&O x3. Normal mood and affect.  Vascular Pedal pulses palpable.  Neurologic Epicritic sensation grossly intact.  Dermatologic No open lesions. Skin normal texture and turgor.  Orthopedic: Pain to palpation right plantar calcaneal tuber   Assessment & Plan:  Patient was evaluated and treated and all questions answered.  Procedure: Injection Tendon/Ligament Location: Right plantar fascia at the glabrous junction; medial approach. Skin Prep: Alcohol. Injectate: 1 cc 0.5% marcaine plain, 1 cc Celestone Disposition: Patient tolerated procedure well. Injection site dressed with a band-aid.  Return in about 6 weeks (around 03/13/2017) for Plantar fasciitis.

## 2017-03-13 ENCOUNTER — Ambulatory Visit: Payer: 59 | Admitting: Podiatry

## 2017-03-13 DIAGNOSIS — M659 Unspecified synovitis and tenosynovitis, unspecified site: Secondary | ICD-10-CM

## 2017-03-13 DIAGNOSIS — M722 Plantar fascial fibromatosis: Secondary | ICD-10-CM | POA: Diagnosis not present

## 2017-03-13 MED ORDER — MELOXICAM 15 MG PO TABS: 15.0000 mg | ORAL_TABLET | Freq: Every day | ORAL | 0 refills | Status: DC

## 2017-03-13 NOTE — Progress Notes (Signed)
  Subjective:  Patient ID: Sydney Alvarado, female    DOB: [birth year],  MRN: [id number]  Chief Complaint  Patient presents with  . Plantar Fasciitis    6 WK follow up  Plantar fasciitis Right.   51 y.o. female presents for heel pain.  The injection actually caused her more pain.  States that she has now had recurrence of the a.m. first up in the morning.  Objective:  There were no vitals filed for this visit. General AA&O x3. Normal mood and affect.  Vascular Pedal pulses palpable.  Neurologic Epicritic sensation grossly intact.  Dermatologic No open lesions. Skin normal texture and turgor.  Orthopedic: Pain to palpation medial calcaneal tuber right   Assessment & Plan:  Patient was evaluated and treated and all questions answered.  Plantar Fasciitis, right - Refer to PT - Plantar fascial brace dispensed. - Educated on over-the-counter orthotics - Refill meloxicam   Return in about 4 weeks (around 04/10/2017) for Plantar fasciitis.

## 2017-03-13 NOTE — Addendum Note (Signed)
Addended by: Harriett Sine D on: 03/13/2017 01:26 PM   Modules accepted: Orders

## 2017-04-10 ENCOUNTER — Ambulatory Visit: Payer: 59 | Admitting: Podiatry

## 2017-04-10 ENCOUNTER — Ambulatory Visit (INDEPENDENT_AMBULATORY_CARE_PROVIDER_SITE_OTHER): Payer: 59

## 2017-04-10 DIAGNOSIS — M722 Plantar fascial fibromatosis: Secondary | ICD-10-CM | POA: Diagnosis not present

## 2017-04-10 MED ORDER — MELOXICAM 15 MG PO TABS: 15.0000 mg | ORAL_TABLET | Freq: Every day | ORAL | 0 refills | Status: DC

## 2017-04-14 NOTE — Progress Notes (Signed)
  Subjective:  Patient ID: Sydney Alvarado, female    DOB: [birth year],  MRN: [id number]  Chief Complaint  Patient presents with  . Plantar Fasciitis    4 wk fup  Plantar fasciitis   52 y.o. female returns for the above complaint.  States she was doing well up until this past week.  States that she was doing physical therapy and finally feeling like she has doing well and not having to any issues.  States that she may have done too much activity and thus started hurting.  Objective:  There were no vitals filed for this visit. General AA&O x3. Normal mood and affect.  Vascular Pedal pulses palpable.  Neurologic Epicritic sensation grossly intact.  Dermatologic No open lesions. Skin normal texture and turgor.  Orthopedic:  Pain to palpation right medial calcaneal tuber   Assessment & Plan:  Patient was evaluated and treated and all questions answered.  Plantar Fasciitis, right - Refill Mobic - Continue PT to MMI - Repeat injection as below.  Procedure: Injection Tendon/Ligament Location: Right plantar fascia at the glabrous junction; medial approach. Skin Prep: Alcohol. Injectate: 1 cc 0.5% marcaine plain, 1 cc dexamethasone phosphate, 0.5 cc kenalog 10. Disposition: Patient tolerated procedure well. Injection site dressed with a band-aid.     -  Return in about 3 weeks (around 05/01/2017) for Plantar fasciitis.

## 2017-04-21 ENCOUNTER — Telehealth: Payer: Self-pay | Admitting: *Deleted

## 2017-04-21 MED ORDER — MELOXICAM 15 MG PO TABS: 15.0000 mg | ORAL_TABLET | Freq: Every day | ORAL | 0 refills | Status: DC

## 2017-04-21 NOTE — Telephone Encounter (Signed)
Refill request Meloxicam. Dr. March Rummage states refill once, pt needs an appt prior to future refills.

## 2017-05-06 ENCOUNTER — Ambulatory Visit: Payer: 59 | Admitting: Podiatry

## 2017-05-28 ENCOUNTER — Encounter: Payer: Self-pay | Admitting: Podiatry

## 2017-05-28 ENCOUNTER — Ambulatory Visit: Payer: 59 | Admitting: Podiatry

## 2017-05-28 DIAGNOSIS — M216X9 Other acquired deformities of unspecified foot: Secondary | ICD-10-CM | POA: Diagnosis not present

## 2017-05-28 DIAGNOSIS — M722 Plantar fascial fibromatosis: Secondary | ICD-10-CM

## 2017-05-28 DIAGNOSIS — M659 Synovitis and tenosynovitis, unspecified: Secondary | ICD-10-CM | POA: Diagnosis not present

## 2017-05-28 MED ORDER — MELOXICAM 15 MG PO TABS: 15.0000 mg | ORAL_TABLET | Freq: Every day | ORAL | 0 refills | Status: DC

## 2017-06-01 ENCOUNTER — Encounter: Payer: Self-pay | Admitting: Family Medicine

## 2017-06-01 ENCOUNTER — Ambulatory Visit: Payer: 59 | Admitting: Family Medicine

## 2017-06-01 ENCOUNTER — Other Ambulatory Visit: Payer: Self-pay

## 2017-06-01 VITALS — BP 118/84 | HR 70 | Temp 98.0°F | Ht 64.5 in | Wt 196.8 lb

## 2017-06-01 DIAGNOSIS — M7989 Other specified soft tissue disorders: Secondary | ICD-10-CM

## 2017-06-01 DIAGNOSIS — R2232 Localized swelling, mass and lump, left upper limb: Secondary | ICD-10-CM | POA: Diagnosis not present

## 2017-06-01 NOTE — Progress Notes (Signed)
   3/18/20199:57 AM  Sydney Alvarado June 09, 1965, 52 y.o. female [id number]  Chief Complaint  Patient presents with  . Mass    found a lump in the far side of the left breast. Very concerned about it    HPI:   Patient is a 52 y.o. female who presents today for mass first noticed last Wednesday in her left posterior axilla. It is not painful, it is not red, warm or draining. She denies any injuries or traumas She denies any lumps of actual breast, changes to breast skin, nipple discharge, or swollen lymph nodes She denies any fhx breast or ovarian cancer  Last mammogram 03/27/17, normal, done at [facility]  Depression screen [facility] 2/9 06/01/2017 06/12/2015 02/01/2015  Decreased Interest 0 0 0  Down, Depressed, Hopeless 0 0 0  PHQ - 2 Score 0 0 0    Allergies  Allergen Reactions  . Sulfa Antibiotics Hives    Type of reaction unknown    Prior to Admission medications   Medication Sig Start Date End Date Taking? Authorizing Provider  cetirizine (ZYRTEC) 10 MG tablet Take 10 mg by mouth daily.    [provider]  glycopyrrolate (ROBINUL) 1 MG tablet Take 1 mg by mouth daily. 10/07/16   [provider]  meloxicam (MOBIC) 15 MG tablet Take 1 tablet (15 mg total) by mouth daily. 05/28/17   Evelina Bucy, DPM    Past Medical History:  Diagnosis Date  . Allergy     Past Surgical History:  Procedure Laterality Date  . ANTERIOR CRUCIATE LIGAMENT REPAIR    . DILATION AND CURETTAGE OF UTERUS    . MOUTH SURGERY      Social History   Tobacco Use  . Smoking status: Never Smoker  . Smokeless tobacco: Never Used  Substance Use Topics  . Alcohol use: No    Family History  Problem Relation Age of Onset  . Diabetes Mother   . Hypertension Mother   . Hypertension Father   . Cancer Father        skin cancer  . Hypertension Brother   . Hypertension Maternal Grandmother   . Heart disease Maternal Grandfather        heart attack  . Hypertension Paternal Grandmother    . Stroke Paternal Grandmother   . Cancer Paternal Grandfather     ROS Per hpi  OBJECTIVE:  Blood pressure 118/84, pulse 70, temperature 98 F (36.7 C), temperature source Oral, height 5' 4.5" (1.638 m), weight 196 lb 12.8 oz (89.3 kg), last menstrual period 05/20/2017, SpO2 97 %.  Physical Exam  Constitutional: She is well-developed, well-nourished, and in no distress.  Pulmonary/Chest: Right breast exhibits no inverted nipple, no mass, no nipple discharge and no skin change. Left breast exhibits no inverted nipple, no mass, no nipple discharge, no skin change and no tenderness.    Lymphadenopathy:    She has no axillary adenopathy.       Right: No supraclavicular adenopathy present.       Left: No supraclavicular adenopathy present.      ASSESSMENT and PLAN  1. Mass of soft tissue of left upper extremity Clinically suggestive of lipoma, ultrasound to confirm diagnosis given location. Consider referral to gen surg. RTC precautions reviewed.  - [place] AXILLA LEFT; Future  Return if symptoms worsen or fail to improve.    Rutherford Guys, MD Primary Care at [place] [place], [place] [address] Ph.  [phone] Fax [phone]

## 2017-06-01 NOTE — Patient Instructions (Addendum)
Referral to general surgery pending results of ultrasound    IF you received an x-ray today, you will receive an invoice from [facility]. Please contact [facility] at [phone] with questions or concerns regarding your invoice.   IF you received labwork today, you will receive an invoice from [facility]. Please contact [facility] at [phone] with questions or concerns regarding your invoice.   Our billing staff will not be able to assist you with questions regarding bills from these companies.  You will be contacted with the lab results as soon as they are available. The fastest way to get your results is to activate your My Chart account. Instructions are located on the last page of this paperwork. If you have not heard from [place] regarding the results in 2 weeks, please contact this office.

## 2017-06-13 NOTE — Progress Notes (Signed)
  Subjective:  Patient ID: Sydney Alvarado, female    DOB: 12-22-1965,  MRN: [id number]  Chief Complaint  Patient presents with  . Plantar Fasciitis    Right foot - only has pain when wearing shoes other than tennis shoes.   52 y.o. female returns for the above complaint.  Getting Frustrated that her pain has not completely resolved.  Still doing PT and has noted improvement.  Has been using up her stretch at home.  Does not pain while wearing tennis shoes but still has pain while wearing flats and flip-flops.  Is concerned that this will never go away.  Objective:  There were no vitals filed for this visit. General AA&O x3. Normal mood and affect.  Vascular Pedal pulses palpable.  Neurologic Epicritic sensation grossly intact.  Dermatologic No open lesions. Skin normal texture and turgor.  Orthopedic:  No pain to palpation right medial calcaneal tuber   Assessment & Plan:  Patient was evaluated and treated and all questions answered.  Plantar Fasciitis, right - Refill Mobic -Lengthy conversation had with patient about continued treatment including stretching and icing. -Should pain not be alleviated would consider possible surgical intervention.    15 minutes of face to face time were spent with the patient. >50% of this was spent on counseling and coordination of care. Specifically discussed with patient the above diagnosis and treatment plan.   Return in about 6 weeks (around 07/09/2017).

## 2017-06-24 ENCOUNTER — Telehealth: Payer: Self-pay | Admitting: Family Medicine

## 2017-06-24 DIAGNOSIS — R2232 Localized swelling, mass and lump, left upper limb: Secondary | ICD-10-CM

## 2017-06-24 DIAGNOSIS — M7989 Other specified soft tissue disorders: Secondary | ICD-10-CM

## 2017-06-24 NOTE — Telephone Encounter (Signed)
Done, thanks

## 2017-06-24 NOTE — Telephone Encounter (Signed)
Sent order for U/S Axilla left to [facility] mammography. They faxed back stating they will also need mammogram included. Can we have mammogram order placed and we will fax back to [facility]? Thanks!

## 2017-07-09 ENCOUNTER — Ambulatory Visit: Payer: 59 | Admitting: Podiatry

## 2017-08-11 ENCOUNTER — Telehealth: Payer: Self-pay | Admitting: Family Medicine

## 2017-08-11 NOTE — Telephone Encounter (Signed)
Copied from [place] [phone]. Topic: Referral - Request >> Aug 11, 2017  3:41 PM Synthia Innocent wrote: Reason for CRM: Requesting referral to Gastroenterology, seen in urgent care for diarrhea and stomach cramps.

## 2017-08-13 NOTE — Telephone Encounter (Signed)
Needs OV.  

## 2017-08-17 NOTE — Telephone Encounter (Signed)
mychart message sent to pt about making an apt for a referral

## 2017-09-29 ENCOUNTER — Ambulatory Visit: Payer: Managed Care, Other (non HMO) | Admitting: Internal Medicine

## 2017-11-12 LAB — HM COLONOSCOPY

## 2017-12-04 NOTE — Progress Notes (Signed)
Diverticulitis  Hyperplastic polyps

## 2018-03-25 ENCOUNTER — Encounter: Payer: Self-pay | Admitting: Podiatry

## 2018-03-25 ENCOUNTER — Ambulatory Visit: Payer: 59 | Admitting: Podiatry

## 2018-03-25 DIAGNOSIS — M79672 Pain in left foot: Secondary | ICD-10-CM | POA: Diagnosis not present

## 2018-03-25 DIAGNOSIS — M722 Plantar fascial fibromatosis: Secondary | ICD-10-CM

## 2018-03-25 DIAGNOSIS — M79671 Pain in right foot: Secondary | ICD-10-CM | POA: Diagnosis not present

## 2018-03-25 NOTE — Patient Instructions (Signed)
Pre-Operative Instructions  Congratulations, you have decided to take an important step towards improving your quality of life.  You can be assured that the doctors and staff at [facility] will be with you every step of the way.  Here are some important things you should know:  1. Plan to be at the surgery center/hospital at least 1 (one) hour prior to your scheduled time, unless otherwise directed by the surgical center/hospital staff.  You must have a responsible adult accompany you, remain during the surgery and drive you home.  Make sure you have directions to the surgical center/hospital to ensure you arrive on time. 2. If you are having surgery at [facility] or [facility] hospitals, you will need a copy of your medical history and physical form from your family physician within one month prior to the date of surgery. We will give you a form for your primary physician to complete.  3. We make every effort to accommodate the date you request for surgery.  However, there are times where surgery dates or times have to be moved.  We will contact you as soon as possible if a change in schedule is required.   4. No aspirin/ibuprofen for one week before surgery.  If you are on aspirin, any non-steroidal anti-inflammatory medications (Mobic, Aleve, Ibuprofen) should not be taken seven (7) days prior to your surgery.  You make take Tylenol for pain prior to surgery.  5. Medications - If you are taking daily heart and blood pressure medications, seizure, reflux, allergy, asthma, anxiety, pain or diabetes medications, make sure you notify the surgery center/hospital before the day of surgery so they can tell you which medications you should take or avoid the day of surgery. 6. No food or drink after midnight the night before surgery unless directed otherwise by surgical center/hospital staff. 7. No alcoholic beverages 24-hours prior to surgery.  No smoking 24-hours prior or 24-hours after  surgery. 8. Wear loose pants or shorts. They should be loose enough to fit over bandages, boots, and casts. 9. Don't wear slip-on shoes. Sneakers are preferred. 10. Bring your boot with you to the surgery center/hospital.  Also bring crutches or a walker if your physician has prescribed it for you.  If you do not have this equipment, it will be provided for you after surgery. 11. If you have not been contacted by the surgery center/hospital by the day before your surgery, call to confirm the date and time of your surgery. 12. Leave-time from work may vary depending on the type of surgery you have.  Appropriate arrangements should be made prior to surgery with your employer. 13. Prescriptions will be provided immediately following surgery by your doctor.  Fill these as soon as possible after surgery and take the medication as directed. Pain medications will not be refilled on weekends and must be approved by the doctor. 14. Remove nail polish on the operative foot and avoid getting pedicures prior to surgery. 15. Wash the night before surgery.  The night before surgery wash the foot and leg well with water and the antibacterial soap provided. Be sure to pay special attention to beneath the toenails and in between the toes.  Wash for at least three (3) minutes. Rinse thoroughly with water and dry well with a towel.  Perform this wash unless told not to do so by your physician.  Enclosed: 1 Ice pack (please put in freezer the night before surgery)   1 Hibiclens skin cleaner     Pre-op instructions  If you have any questions regarding the instructions, please do not hesitate to call our office.  [place]: [address], [place], [place] [address] -- [phone]  [place]: [address] [place], [place] [address] -- [phone]  [place]: [address]  [place], [place] [address] -- [phone]  [place]: [address], [place], [place] [address] -- [phone]  Website:  [email or web address] 

## 2018-03-28 NOTE — Progress Notes (Signed)
  Subjective:  Patient ID: Sydney Alvarado, female    DOB: [birth year],  MRN: [id number]  Chief Complaint  Patient presents with  . Plantar Fasciitis    bilateral; pt stated, "pain never went away in R foot; L-started hurting back in Sept 2019; same issue in both feet"   53 y.o. female presents with the above complaint. History as above.  Review of Systems: Negative except as noted in the HPI. Denies N/V/F/Ch.  Past Medical History:  Diagnosis Date  . Allergy     Current Outpatient Medications:  .  cetirizine (ZYRTEC) 10 MG tablet, Take 10 mg by mouth daily., Disp: , Rfl:  .  glycopyrrolate (ROBINUL) 1 MG tablet, Take 1 mg by mouth daily., Disp: , Rfl: 6 .  meloxicam (MOBIC) 15 MG tablet, Take 1 tablet (15 mg total) by mouth daily., Disp: 15 tablet, Rfl: 0  Social History   Tobacco Use  Smoking Status Never Smoker  Smokeless Tobacco Never Used    Allergies  Allergen Reactions  . Sulfa Antibiotics Hives    Type of reaction unknown   Objective:  There were no vitals filed for this visit. There is no height or weight on file to calculate BMI. Constitutional Well developed. Well nourished.  Vascular Dorsalis pedis pulses palpable bilaterally. Posterior tibial pulses palpable bilaterally. Capillary refill normal to all digits.  No cyanosis or clubbing noted. Pedal hair growth normal.  Neurologic Normal speech. Oriented to person, place, and time. Epicritic sensation to light touch grossly present bilaterally.  Dermatologic Nails well groomed and normal in appearance. No open wounds. No skin lesions.  Orthopedic: Normal joint ROM without pain or crepitus bilaterally. No visible deformities. Tender to palpation at the calcaneal tuber bilaterally. No pain with calcaneal squeeze bilaterally. Ankle ROM diminished range of motion bilaterally. Silfverskiold Test: positive bilaterally.   Assessment:   1. Plantar fasciitis, right   2. Intractable right heel pain   3. Pain  of left heel    Plan:  Patient was evaluated and treated and all questions answered.  Plantar Fasciitis, bilat -Injection delivered Left -As patient has failed conservative therapy we will proceed with right endoscopic plantar fasciectomy to alleviate her pain would consider the left at a later date.  All r/b/a/  discussed patient would like to proceed -Patient has failed all conservative therapy and wishes to proceed with surgical intervention. All risks, benefits, and alternatives discussed with patient. No guarantees given. Consent reviewed and signed by patient. -Planned procedures: R Endoscopic Plantar Fasciotomy.   Procedure: Injection Tendon/Ligament Location: Left plantar fascia at the glabrous junction; medial approach. Skin Prep: alcohol Injectate: 1 cc 0.5% marcaine plain, 1 cc dexamethasone phosphate, 0.5 cc kenalog 40. Disposition: Patient tolerated procedure well. Injection site dressed with a band-aid.  No follow-ups on file.

## 2018-04-01 ENCOUNTER — Telehealth: Payer: Self-pay

## 2018-04-01 NOTE — Telephone Encounter (Signed)
Spoke with pt, appt is scheduled on Monday 04/05/2018 for US of the breast.

## 2018-04-05 ENCOUNTER — Encounter: Payer: Self-pay | Admitting: Family Medicine

## 2018-04-05 LAB — HM MAMMOGRAPHY

## 2018-04-09 ENCOUNTER — Encounter: Payer: Self-pay | Admitting: Emergency Medicine

## 2018-04-09 ENCOUNTER — Encounter: Payer: Self-pay | Admitting: Family Medicine

## 2018-04-09 NOTE — Progress Notes (Signed)
IMPRESSION: There id no sonographic evidence of malignancy. Repeat 1 yr

## 2018-04-12 ENCOUNTER — Telehealth: Payer: Self-pay | Admitting: *Deleted

## 2018-04-12 NOTE — Telephone Encounter (Signed)
"  I'm scheduled for a procedure on Wednesday with Dr. March Rummage at the surgical center on [facility].  I haven't received any notifications telling me what time the procedure is.  So I'm just curious to see if you can help me with this or if I'm calling the right person for this.  I don't know if I'm supposed to call you or the surgical center.  Give me a call back and let me know the time or if I need to call someone else."

## 2018-04-13 NOTE — Telephone Encounter (Signed)
I am returning your call.  Someone from the surgical center will probably call you today if they haven't done so already.  "They haven't called."  You are welcome to call them.  Their phone number is on the back of the brochure that we gave you.  "Okay, I'll give them a call."

## 2018-04-14 ENCOUNTER — Other Ambulatory Visit: Payer: Self-pay | Admitting: Podiatry

## 2018-04-14 DIAGNOSIS — M722 Plantar fascial fibromatosis: Secondary | ICD-10-CM | POA: Diagnosis not present

## 2018-04-14 MED ORDER — OXYCODONE-ACETAMINOPHEN 5-325 MG PO TABS: 1.0000 | ORAL_TABLET | ORAL | 0 refills | Status: DC | PRN

## 2018-04-14 MED ORDER — ONDANSETRON HCL 4 MG PO TABS: 4.0000 mg | ORAL_TABLET | Freq: Three times a day (TID) | ORAL | 0 refills | Status: DC | PRN

## 2018-04-14 MED ORDER — CEPHALEXIN 500 MG PO CAPS: 500.0000 mg | ORAL_CAPSULE | Freq: Two times a day (BID) | ORAL | 0 refills | Status: DC

## 2018-04-14 NOTE — Progress Notes (Signed)
Rx sent to pharmacy for outpatient surgery. °

## 2018-04-17 NOTE — Progress Notes (Signed)
Repeat in one year.

## 2018-04-23 ENCOUNTER — Other Ambulatory Visit: Payer: 59

## 2018-04-24 ENCOUNTER — Encounter: Payer: Self-pay | Admitting: Podiatry

## 2018-04-24 ENCOUNTER — Ambulatory Visit (INDEPENDENT_AMBULATORY_CARE_PROVIDER_SITE_OTHER): Payer: 59 | Admitting: Podiatry

## 2018-04-24 DIAGNOSIS — Z09 Encounter for follow-up examination after completed treatment for conditions other than malignant neoplasm: Secondary | ICD-10-CM

## 2018-04-24 NOTE — Progress Notes (Signed)
Patient has mild discomfort noted at the insertion of the plantar fascia left foot patient presents the office for an evaluation of her right foot following surgery by Dr. March Rummage.  Surgery was performed approximately 10 days ago and an EPF for plantar fasciitis right foot was performed.  Patient states that she has pain upon pressure to the surgical site.  She presents to the office today wearing a bandage and walking in a cam walker.  Patient does admit that she had plantar fasciitis surgery in her left foot in September 2019.  Objective neurovascular status intact no pain noted to the calf of the right leg good wound coaptation at the 2 surgical sites right heel mild swelling and pain noted at the insertion of the plantar fascia right foot.  Patient states that she has pain and discomfort at the insertion of the plantar fascia left foot.  S/P Foot surgery right foot.  ROV>  Betadine /DSD redressing.  Patient was told to wear this bandage for an additional week and to ambulate with her cam walker right foot.  I also discussed wearing cam walker to sleep to keep stretch on her feet following surgery.   Gardiner Barefoot DPM

## 2018-04-30 ENCOUNTER — Ambulatory Visit (INDEPENDENT_AMBULATORY_CARE_PROVIDER_SITE_OTHER): Payer: 59 | Admitting: Podiatry

## 2018-04-30 DIAGNOSIS — Z09 Encounter for follow-up examination after completed treatment for conditions other than malignant neoplasm: Secondary | ICD-10-CM

## 2018-04-30 DIAGNOSIS — M722 Plantar fascial fibromatosis: Secondary | ICD-10-CM | POA: Diagnosis not present

## 2018-05-14 ENCOUNTER — Ambulatory Visit (INDEPENDENT_AMBULATORY_CARE_PROVIDER_SITE_OTHER): Payer: 59 | Admitting: Podiatry

## 2018-05-14 ENCOUNTER — Encounter: Payer: Self-pay | Admitting: Podiatry

## 2018-05-14 DIAGNOSIS — M722 Plantar fascial fibromatosis: Secondary | ICD-10-CM

## 2018-05-14 DIAGNOSIS — Z9889 Other specified postprocedural states: Secondary | ICD-10-CM

## 2018-05-14 NOTE — Progress Notes (Signed)
Subjective:  Patient ID: Sydney Alvarado, female    DOB: 03/08/1966,  MRN: [id number]  Chief Complaint  Patient presents with  . Routine Post Op    dos 01.29.2020 EPF Rt, PRP Injection Rt " my foot doesnt hurt unless i am very active"     DOS: 04/14/2018 Procedure: R EPF, PRP Injection  52 y.o. female returns for post-op check. History as above.  Review of Systems: Negative except as noted in the HPI. Denies N/V/F/Ch.  Past Medical History:  Diagnosis Date  . Allergy     Current Outpatient Medications:  .  cephALEXin (KEFLEX) 500 MG capsule, Take 1 capsule (500 mg total) by mouth 2 (two) times daily., Disp: 14 capsule, Rfl: 0 .  cetirizine (ZYRTEC) 10 MG tablet, Take 10 mg by mouth daily., Disp: , Rfl:  .  glycopyrrolate (ROBINUL) 1 MG tablet, Take 1 mg by mouth daily., Disp: , Rfl: 6 .  meloxicam (MOBIC) 15 MG tablet, Take 1 tablet (15 mg total) by mouth daily., Disp: 15 tablet, Rfl: 0 .  ondansetron (ZOFRAN) 4 MG tablet, Take 1 tablet (4 mg total) by mouth every 8 (eight) hours as needed for nausea or vomiting., Disp: 20 tablet, Rfl: 0 .  oxyCODONE-acetaminophen (PERCOCET) 5-325 MG tablet, Take 1 tablet by mouth every 4 (four) hours as needed for severe pain., Disp: 20 tablet, Rfl: 0  Social History   Tobacco Use  Smoking Status Never Smoker  Smokeless Tobacco Never Used    Allergies  Allergen Reactions  . Sulfa Antibiotics Hives    Type of reaction unknown   Objective:  There were no vitals filed for this visit. There is no height or weight on file to calculate BMI. Constitutional Well developed. Well nourished.  Vascular Foot warm and well perfused. Capillary refill normal to all digits.   Neurologic Normal speech. Oriented to person, place, and time. Epicritic sensation to light touch grossly present bilaterally.  Dermatologic Skin healing well without signs of infection. Skin edges well coapted without signs of infection.  Orthopedic: Mild tenderness to  palpation noted about the surgical site.   Radiographs: None Assessment:   1. Surgery follow-up examination   2. Plantar fasciitis, right    Plan:  Patient was evaluated and treated and all questions answered.  S/p foot surgery right -Progressing as expected post-operatively. -XR: None -WB Status: WBAT in surgical shoe -Sutures: out. -Medications: none refilled. -Foot redressed.  Return in about 2 weeks (around 05/14/2018) for Lus Kriegel patient.

## 2018-05-15 NOTE — Progress Notes (Signed)
  Subjective:  Patient ID: Sydney Alvarado, female    DOB: 06/29/1965,  MRN: [id number]  Chief Complaint  Patient presents with  . Routine Post Op    dos 01.29.2020 EPF Rt, PRP Injection Rt    "It was swollen and bruised over the weekend but I did a lot of walking in [place]"  . Plantar Fasciitis    Follow up left heel   "I may need an injection in the left heel today"   DOS: 04/14/2018 Procedure: R EPF, PRP Injection  53 y.o. female returns for post-op check. History as above.  States that it was bruised and swollen a little bit from doing a lot of activity but the pain is nowhere near what it was prior to surgery.  Having a lot of pain left heel requesting injection today  Review of Systems: Negative except as noted in the HPI. Denies N/V/F/Ch.  Past Medical History:  Diagnosis Date  . Allergy     Current Outpatient Medications:  .  cetirizine (ZYRTEC) 10 MG tablet, Take 10 mg by mouth daily., Disp: , Rfl:  .  glycopyrrolate (ROBINUL) 1 MG tablet, Take 1 mg by mouth daily., Disp: , Rfl: 6 .  meloxicam (MOBIC) 15 MG tablet, Take 1 tablet (15 mg total) by mouth daily., Disp: 15 tablet, Rfl: 0 .  ondansetron (ZOFRAN) 4 MG tablet, Take 1 tablet (4 mg total) by mouth every 8 (eight) hours as needed for nausea or vomiting., Disp: 20 tablet, Rfl: 0 .  oxyCODONE-acetaminophen (PERCOCET) 5-325 MG tablet, Take 1 tablet by mouth every 4 (four) hours as needed for severe pain., Disp: 20 tablet, Rfl: 0  Social History   Tobacco Use  Smoking Status Never Smoker  Smokeless Tobacco Never Used    Allergies  Allergen Reactions  . Sulfa Antibiotics Hives    Type of reaction unknown   Objective:  There were no vitals filed for this visit. There is no height or weight on file to calculate BMI. Constitutional Well developed. Well nourished.  Vascular Foot warm and well perfused. Capillary refill normal to all digits.   Neurologic Normal speech. Oriented to person, place, and  time. Epicritic sensation to light touch grossly present bilaterally.  Dermatologic Skin healing well without signs of infection. Skin edges well coapted without signs of infection.  Orthopedic: Mild tenderness to palpation noted about the surgical site. Pain to palpation about the left heel calcaneal tuber   Radiographs: None Assessment:   1. Plantar fasciitis, right   2. Status post right foot surgery   3. Plantar fasciitis    Plan:  Patient was evaluated and treated and all questions answered.  S/p foot surgery right -Progressing as expected post-operatively. -XR: None -WB Status: WBAT in normal shoe gear -Sutures: out. -Medications: none refilled. -Foot redressed.  Plantar Fasciitis, left - Injection delivered to the plantar fascia as below.  Procedure: Injection Tendon/Ligament Location: Left plantar fascia at the glabrous junction; medial approach. Skin Prep: Alcohol. Injectate: 1 cc 0.5% marcaine plain, 1 cc dexamethasone phosphate, 0.5 cc kenalog 10. Disposition: Patient tolerated procedure well. Injection site dressed with a band-aid.   Return in about 6 weeks (around 06/25/2018) for Plantar fasciitis, Left, Post-op, Right.

## 2018-07-02 ENCOUNTER — Ambulatory Visit (INDEPENDENT_AMBULATORY_CARE_PROVIDER_SITE_OTHER): Payer: 59 | Admitting: Podiatry

## 2018-07-02 ENCOUNTER — Other Ambulatory Visit: Payer: Self-pay

## 2018-07-02 ENCOUNTER — Encounter: Payer: Self-pay | Admitting: Podiatry

## 2018-07-02 DIAGNOSIS — M722 Plantar fascial fibromatosis: Secondary | ICD-10-CM

## 2018-07-02 NOTE — Progress Notes (Signed)
  Subjective:  Patient ID: Sydney Alvarado, female    DOB: [birth year],  MRN: [id number]  Chief Complaint  Patient presents with  . Routine Post Op    DOS 01.29.2020 EPF Rt, PRP Injection Rt; pt stated, "I was standing on my feet for 8-11 hours painting my daughter's house - foot hurts this week"  . Foot Pain    Left foot; bottom of heel; pt stated, "Wants to get an injection in left foot; last injection did not help much"; x1 yr   DOS: 04/14/2018 Procedure: R EPF, PRP Injection  53 y.o. female returns for post-op check. States the right is doing very well and only causing pain when she does a lot of activity. She thinks she is not wearing appropriate sneakers which makes it worse.  States the left heel is hurting a lot, thinks the last injection didn't help.  Review of Systems: Negative except as noted in the HPI. Denies N/V/F/Ch.  Past Medical History:  Diagnosis Date  . Allergy     Current Outpatient Medications:  .  cetirizine (ZYRTEC) 10 MG tablet, Take 10 mg by mouth daily., Disp: , Rfl:  .  glycopyrrolate (ROBINUL) 1 MG tablet, Take 1 mg by mouth daily., Disp: , Rfl: 6 .  meloxicam (MOBIC) 15 MG tablet, Take 1 tablet (15 mg total) by mouth daily., Disp: 15 tablet, Rfl: 0  Social History   Tobacco Use  Smoking Status Never Smoker  Smokeless Tobacco Never Used    Allergies  Allergen Reactions  . Sulfa Antibiotics Hives    Type of reaction unknown   Objective:   Vitals:   07/02/18 1307  Temp: 98.1 F (36.7 C)   There is no height or weight on file to calculate BMI. Constitutional Well developed. Well nourished.  Vascular Foot warm and well perfused. Capillary refill normal to all digits.   Neurologic Normal speech. Oriented to person, place, and time. Epicritic sensation to light touch grossly present bilaterally.  Dermatologic Skin healed right foot.  Orthopedic: No tenderness right heel. POP Left medial calc tuber.   Radiographs: None Assessment:   1.  Plantar fasciitis    Plan:  Patient was evaluated and treated and all questions answered.  S/p foot surgery right -Progressing as expected post-operatively. -Continue WBAT in normal shoes. Discussed use of good supportive shoes.  Plantar Fasciitis, left - Final injection left heel - Discussed if pain persists she would benefit from surgery on her left foot. Consent forms reviewed for this occurrence. -Patient has failed all conservative therapy and wishes to proceed with surgical intervention. All risks, benefits, and alternatives discussed with patient. No guarantees given. Consent reviewed and signed by patient. -Planned procedures: left open vs endoscopic plantar fasciotomy, PRP injection.   Procedure: Injection Tendon/Ligament Consent: Verbal consent obtained. Location: Left plantar fascia at the glabrous junction; medial approach. Skin Prep: Alcohol. Injectate: 1 cc 0.5% marcaine plain, 1 cc dexamethasone phosphate, 0.5 cc kenalog 10. Disposition: Patient tolerated procedure well. Injection site dressed with a band-aid.      Return in about 6 weeks (around 08/13/2018).

## 2018-07-08 ENCOUNTER — Telehealth: Payer: Self-pay | Admitting: *Deleted

## 2018-07-08 NOTE — Telephone Encounter (Signed)
I am calling to schedule your surgery.  "I thought we were going to wait to see how the shot does and he see me back in the office.  I can't do the surgery right now.  I'll call back later to schedule it if I need the surgery."  Okay that is fine.

## 2018-07-13 ENCOUNTER — Telehealth: Payer: Self-pay | Admitting: *Deleted

## 2018-07-13 NOTE — Telephone Encounter (Signed)
"  You called me last week about my surgery.  I spoke it over with my husband and he asked me why I wanted to keep being in pain.  So, I want to schedule my surgery.  I know this may be stretching it but does he have anything available for May 6?"  Yes, he can do it on Jul 21, 2018.  I'll get it scheduled.  "Will I have to enter all that information again about my history?"  When did you do it?  "I did it in January.  "No, all your information should still be in the system.  Someone from the surgical center will call you a day or two prior to your surgery date and they will give you your arrival time.

## 2018-07-19 ENCOUNTER — Telehealth: Payer: Self-pay | Admitting: *Deleted

## 2018-07-19 NOTE — Telephone Encounter (Signed)
"  I'm scheduled to have surgery on Wednesday.  I have a boot from my other surgery.  Can the boot be worn on either foot?"  Yes, it can be worn on either foot.  "Okay, I'll bring it with me to the surgery on Wednesday."

## 2018-07-19 NOTE — Telephone Encounter (Signed)
DOS 07/21/2018, CPT CODE 79038 - EPF LEFT FOOT  [employer] INSURANCE: Plan Date Mar 17, 3336  PRE-CERT IS NOT REQUIRED FOR 32919  Co-Payment - Surgical Network Not Applicable Family Surgeon  Outpatient Surgeon  $0.00   Co-Insurance - Surgical  In Network Family  All Other In-Network Providers  Surgeon,COINS APPLIES TO OUT OF POCKET  Outpatient Surgeon,COINS APPLIES TO OUT OF POCKET  20 %  Deductible - Health Benefit Plan Coverage  In Network Individual  Plan Start Date Mar 17, 2018  Med Dent,All Other In-Network Providers,DED INCLUDED IN OOP  $500.00  Calendar Year  - $500.00  Year to Date  $0.00  Remaining    Out of Pocket (Stop Loss) - Health Benefit Plan Coverage  In Network Individual  All Other In-Network Providers  $2,000.00   In Network Individual  $404.05  Remaining

## 2018-07-21 ENCOUNTER — Other Ambulatory Visit: Payer: Self-pay | Admitting: Podiatry

## 2018-07-21 DIAGNOSIS — M722 Plantar fascial fibromatosis: Secondary | ICD-10-CM | POA: Diagnosis not present

## 2018-07-21 MED ORDER — CEPHALEXIN 500 MG PO CAPS: 500.0000 mg | ORAL_CAPSULE | Freq: Two times a day (BID) | ORAL | 0 refills | Status: DC

## 2018-07-21 NOTE — Progress Notes (Signed)
Rx sent to pharmacy for outpatient surgery. °

## 2018-07-22 ENCOUNTER — Encounter: Payer: Self-pay | Admitting: Podiatry

## 2018-07-22 ENCOUNTER — Telehealth: Payer: Self-pay

## 2018-07-22 NOTE — Telephone Encounter (Signed)
POST OP CALL-    1) General condition stated by the patient: okay  2) Is the pt having pain? Mild soreness when moving  3) Pain score:   4) Has the pt taken Rx'd pain medication, regularly or PRN? Yes   5) Is the pain medication giving relief? yes  6) Any fever, chills, nausea, or vomiting, shortness of breath or tightness in calf? No   7) Is the bandage clean, dry and intact? yes  8) Is there excessive tightness, bleeding or drainage coming through the bandage? no  9) Did you understand all of the post op instruction sheet given? Yes   10) Any questions or concerns regarding post op care/recovery? no    Confirmed POV appointment with patient

## 2018-07-29 ENCOUNTER — Ambulatory Visit (INDEPENDENT_AMBULATORY_CARE_PROVIDER_SITE_OTHER): Payer: Self-pay | Admitting: Podiatry

## 2018-07-29 ENCOUNTER — Encounter: Payer: Self-pay | Admitting: Podiatry

## 2018-07-29 ENCOUNTER — Other Ambulatory Visit: Payer: Self-pay

## 2018-07-29 DIAGNOSIS — Z9889 Other specified postprocedural states: Secondary | ICD-10-CM

## 2018-07-29 DIAGNOSIS — M722 Plantar fascial fibromatosis: Secondary | ICD-10-CM

## 2018-08-05 ENCOUNTER — Other Ambulatory Visit: Payer: Self-pay

## 2018-08-05 ENCOUNTER — Ambulatory Visit (INDEPENDENT_AMBULATORY_CARE_PROVIDER_SITE_OTHER): Payer: 59 | Admitting: Podiatry

## 2018-08-05 ENCOUNTER — Encounter: Payer: Self-pay | Admitting: Podiatry

## 2018-08-05 VITALS — Temp 98.1°F

## 2018-08-05 DIAGNOSIS — M722 Plantar fascial fibromatosis: Secondary | ICD-10-CM | POA: Diagnosis not present

## 2018-08-05 NOTE — Progress Notes (Signed)
  Subjective:  Patient ID: Sydney Alvarado, female    DOB: [birth year],  MRN: [id number]  Chief Complaint  Patient presents with  . Routine Post Op    Rt foot: " my foot feels fine"     DOS: 07/21/2018 Procedure: EPF L, PRP injection  53 y.o. female returns for post-op check. States she has no pain when she steps on her heel in the AM. Denies pain at present. Thinks her foot is doing very well.  Review of Systems: Negative except as noted in the HPI. Denies N/V/F/Ch.  Past Medical History:  Diagnosis Date  . Allergy     Current Outpatient Medications:  .  cephALEXin (KEFLEX) 500 MG capsule, Take 1 capsule (500 mg total) by mouth 2 (two) times daily., Disp: 14 capsule, Rfl: 0 .  cetirizine (ZYRTEC) 10 MG tablet, Take 10 mg by mouth daily., Disp: , Rfl:  .  glycopyrrolate (ROBINUL) 1 MG tablet, Take 1 mg by mouth daily., Disp: , Rfl: 6 .  meloxicam (MOBIC) 15 MG tablet, Take 1 tablet (15 mg total) by mouth daily., Disp: 15 tablet, Rfl: 0  Social History   Tobacco Use  Smoking Status Never Smoker  Smokeless Tobacco Never Used    Allergies  Allergen Reactions  . Sulfa Antibiotics Hives    Type of reaction unknown   Objective:   Vitals:   08/05/18 1513  Temp: 98.1 F (36.7 C)   There is no height or weight on file to calculate BMI. Constitutional Well developed. Well nourished.  Vascular Foot warm and well perfused. Capillary refill normal to all digits.   Neurologic Normal speech. Oriented to person, place, and time. Epicritic sensation to light touch grossly present bilaterally.  Dermatologic Skin healing well with intact suture.  Orthopedic: Slight tenderness to palpation noted about the surgical site.   Radiographs: None Assessment:   1. Plantar fasciitis    Plan:  Patient was evaluated and treated and all questions answered.  S/p foot surgery left -Progressing as expected post-operatively. -XR: None -WB Status: WBAT in surgical shoe with heel lift.  -Sutures: removed.. -Medications: non refilled. -Foot redressed.  No follow-ups on file.

## 2018-08-05 NOTE — Progress Notes (Signed)
  Subjective:  Patient ID: Sydney Alvarado, female    DOB: 1965-08-23,  MRN: [id number]  Chief Complaint  Patient presents with  . Routine Post Op    DOS 07/21/2018 EPF LT " my foot feels fine, I have not had much pain"     DOS: 07/21/2018 Procedure: EPF L, PRP injection  53 y.o. female returns for post-op check. States the left feels fine. States her post-op course was better this time than previous foot.  Review of Systems: Negative except as noted in the HPI. Denies N/V/F/Ch.  Past Medical History:  Diagnosis Date  . Allergy     Current Outpatient Medications:  .  cephALEXin (KEFLEX) 500 MG capsule, Take 1 capsule (500 mg total) by mouth 2 (two) times daily., Disp: 14 capsule, Rfl: 0 .  cetirizine (ZYRTEC) 10 MG tablet, Take 10 mg by mouth daily., Disp: , Rfl:  .  glycopyrrolate (ROBINUL) 1 MG tablet, Take 1 mg by mouth daily., Disp: , Rfl: 6 .  meloxicam (MOBIC) 15 MG tablet, Take 1 tablet (15 mg total) by mouth daily., Disp: 15 tablet, Rfl: 0  Social History   Tobacco Use  Smoking Status Never Smoker  Smokeless Tobacco Never Used    Allergies  Allergen Reactions  . Sulfa Antibiotics Hives    Type of reaction unknown   Objective:  There were no vitals filed for this visit. There is no height or weight on file to calculate BMI. Constitutional Well developed. Well nourished.  Vascular Foot warm and well perfused. Capillary refill normal to all digits.   Neurologic Normal speech. Oriented to person, place, and time. Epicritic sensation to light touch grossly present bilaterally.  Dermatologic Skin healing well without signs of infection. Skin edges well coapted without signs of infection.  Orthopedic: Tenderness to palpation noted about the surgical site.   Radiographs: None Assessment:   1. Plantar fasciitis   2. Status post right foot surgery    Plan:  Patient was evaluated and treated and all questions answered.  S/p foot surgery left -Progressing as  expected post-operatively. -XR: None -WB Status: WBAT in boot. -Sutures: intact.. -Medications: non refilled. -Foot redressed.  Return for keep current post op appt, price patient.

## 2018-08-13 ENCOUNTER — Ambulatory Visit: Payer: 59 | Admitting: Podiatry

## 2018-08-19 ENCOUNTER — Encounter: Payer: 59 | Admitting: Podiatry

## 2018-08-26 ENCOUNTER — Ambulatory Visit (INDEPENDENT_AMBULATORY_CARE_PROVIDER_SITE_OTHER): Payer: 59 | Admitting: Podiatry

## 2018-08-26 ENCOUNTER — Encounter: Payer: Self-pay | Admitting: Podiatry

## 2018-08-26 ENCOUNTER — Other Ambulatory Visit: Payer: Self-pay

## 2018-08-26 VITALS — Temp 98.1°F

## 2018-08-26 DIAGNOSIS — M722 Plantar fascial fibromatosis: Secondary | ICD-10-CM

## 2018-08-26 DIAGNOSIS — Z9889 Other specified postprocedural states: Secondary | ICD-10-CM

## 2018-09-14 NOTE — Progress Notes (Signed)
  Subjective:  Patient ID: Sydney Alvarado, female    DOB: 03-Dec-1965,  MRN: [id number]  Chief Complaint  Patient presents with  . Routine Post Op    EPF on left foot, pt is feeling better, but is still having pain, pts pain is is at a scale of 2 out of 10, pt has a hard time walking from time to time, pt is also looking to have a clean bill of health    DOS: 07/21/2018 Procedure: EPF L, PRP injection  53 y.o. female returns for post-op check.  History as above overall doing well really only having pain when she overexerts herself.  Review of Systems: Negative except as noted in the HPI. Denies N/V/F/Ch.  Past Medical History:  Diagnosis Date  . Allergy     Current Outpatient Medications:  .  cephALEXin (KEFLEX) 500 MG capsule, Take 1 capsule (500 mg total) by mouth 2 (two) times daily., Disp: 14 capsule, Rfl: 0 .  cetirizine (ZYRTEC) 10 MG tablet, Take 10 mg by mouth daily., Disp: , Rfl:  .  glycopyrrolate (ROBINUL) 1 MG tablet, Take 1 mg by mouth daily., Disp: , Rfl: 6 .  meloxicam (MOBIC) 15 MG tablet, Take 1 tablet (15 mg total) by mouth daily., Disp: 15 tablet, Rfl: 0  Social History   Tobacco Use  Smoking Status Never Smoker  Smokeless Tobacco Never Used    Allergies  Allergen Reactions  . Sulfa Antibiotics Hives    Type of reaction unknown   Objective:   Vitals:   08/26/18 1521  Temp: 98.1 F (36.7 C)   There is no height or weight on file to calculate BMI. Constitutional Well developed. Well nourished.  Vascular Foot warm and well perfused. Capillary refill normal to all digits.   Neurologic Normal speech. Oriented to person, place, and time. Epicritic sensation to light touch grossly present bilaterally.  Dermatologic Skin well-healed.  Orthopedic:  Mild pain to palpation about the left medial calcaneal tuber   Radiographs: None Assessment:   1. Status post right foot surgery   2. Plantar fasciitis    Plan:  Patient was evaluated and treated and all  questions answered.  S/p foot surgery bilateral -Overall she is doing very well she is really only having pain when she overexerts herself I discussed with her making sure that she is wearing inserts and does not overexert herself.  I cleared her to return to activity as comfortable.  Follow-up in 6 weeks to ensure that her pain is under control  Return in about 6 weeks (around 10/07/2018) for Heel pain f/u bilat.

## 2018-10-14 ENCOUNTER — Encounter: Payer: 59 | Admitting: Podiatry

## 2018-10-16 DIAGNOSIS — K5792 Diverticulitis of intestine, part unspecified, without perforation or abscess without bleeding: Secondary | ICD-10-CM

## 2018-10-16 HISTORY — DX: Diverticulitis of intestine, part unspecified, without perforation or abscess without bleeding: K57.92

## 2018-10-25 ENCOUNTER — Emergency Department (HOSPITAL_BASED_OUTPATIENT_CLINIC_OR_DEPARTMENT_OTHER): Payer: 59

## 2018-10-25 ENCOUNTER — Encounter (HOSPITAL_BASED_OUTPATIENT_CLINIC_OR_DEPARTMENT_OTHER): Payer: Self-pay | Admitting: *Deleted

## 2018-10-25 ENCOUNTER — Inpatient Hospital Stay (HOSPITAL_BASED_OUTPATIENT_CLINIC_OR_DEPARTMENT_OTHER)
Admission: EM | Admit: 2018-10-25 | Discharge: 2018-10-30 | DRG: 392 | Disposition: A | Discharge status: Home / Self Care | Payer: 59 | Attending: Internal Medicine | Admitting: Internal Medicine

## 2018-10-25 ENCOUNTER — Other Ambulatory Visit: Payer: Self-pay

## 2018-10-25 DIAGNOSIS — Z882 Allergy status to sulfonamides status: Secondary | ICD-10-CM

## 2018-10-25 DIAGNOSIS — Z833 Family history of diabetes mellitus: Secondary | ICD-10-CM | POA: Diagnosis not present

## 2018-10-25 DIAGNOSIS — K5792 Diverticulitis of intestine, part unspecified, without perforation or abscess without bleeding: Secondary | ICD-10-CM

## 2018-10-25 DIAGNOSIS — K5720 Diverticulitis of large intestine with perforation and abscess without bleeding: Principal | ICD-10-CM | POA: Diagnosis present

## 2018-10-25 DIAGNOSIS — D72829 Elevated white blood cell count, unspecified: Secondary | ICD-10-CM | POA: Diagnosis present

## 2018-10-25 DIAGNOSIS — R1031 Right lower quadrant pain: Secondary | ICD-10-CM

## 2018-10-25 DIAGNOSIS — Z823 Family history of stroke: Secondary | ICD-10-CM | POA: Diagnosis not present

## 2018-10-25 DIAGNOSIS — R109 Unspecified abdominal pain: Secondary | ICD-10-CM | POA: Diagnosis present

## 2018-10-25 DIAGNOSIS — R1030 Lower abdominal pain, unspecified: Secondary | ICD-10-CM | POA: Diagnosis not present

## 2018-10-25 DIAGNOSIS — Z8249 Family history of ischemic heart disease and other diseases of the circulatory system: Secondary | ICD-10-CM

## 2018-10-25 DIAGNOSIS — D72828 Other elevated white blood cell count: Secondary | ICD-10-CM

## 2018-10-25 DIAGNOSIS — Z20828 Contact with and (suspected) exposure to other viral communicable diseases: Secondary | ICD-10-CM | POA: Diagnosis present

## 2018-10-25 LAB — COMPREHENSIVE METABOLIC PANEL
ALT: 22 U/L (ref 0–44)
AST: 18 U/L (ref 15–41)
Albumin: 4 g/dL (ref 3.5–5.0)
Alkaline Phosphatase: 66 U/L (ref 38–126)
Anion gap: 14 (ref 5–15)
BUN: 9 mg/dL (ref 6–20)
CO2: 22 mmol/L (ref 22–32)
Calcium: 9.7 mg/dL (ref 8.9–10.3)
Chloride: 101 mmol/L (ref 98–111)
Creatinine, Ser: 0.89 mg/dL (ref 0.44–1.00)
GFR calc Af Amer: >60 mL/min (ref >60)
GFR calc non Af Amer: >60 mL/min (ref >60)
Glucose, Bld: 129 mg/dL — ABNORMAL HIGH (ref 70–99)
Potassium: 4.6 mmol/L (ref 3.5–5.1)
Sodium: 137 mmol/L (ref 135–145)
Total Bilirubin: 1 mg/dL (ref 0.3–1.2)
Total Protein: 8.1 g/dL (ref 6.5–8.1)

## 2018-10-25 LAB — URINALYSIS, ROUTINE W REFLEX MICROSCOPIC
Bilirubin Urine: NEGATIVE
Glucose, UA: NEGATIVE mg/dL
Hgb urine dipstick: NEGATIVE
Ketones, ur: NEGATIVE mg/dL
Leukocytes,Ua: NEGATIVE
Nitrite: NEGATIVE
Protein, ur: NEGATIVE mg/dL
Specific Gravity, Urine: <1.005 — ABNORMAL LOW (ref 1.005–1.030)
pH: 6.5 (ref 5.0–8.0)

## 2018-10-25 LAB — CBC WITH DIFFERENTIAL/PLATELET
Abs Immature Granulocytes: 0.21 10*3/uL — ABNORMAL HIGH (ref 0.00–0.07)
Basophils Absolute: 0.1 10*3/uL (ref 0.0–0.1)
Basophils Relative: 0 %
Eosinophils Absolute: 0 10*3/uL (ref 0.0–0.5)
Eosinophils Relative: 0 %
HCT: 43.4 % (ref 36.0–46.0)
Hemoglobin: 13.8 g/dL (ref 12.0–15.0)
Immature Granulocytes: 1 %
Lymphocytes Relative: 5 %
Lymphs Abs: 1.5 10*3/uL (ref 0.7–4.0)
MCH: 28.8 pg (ref 26.0–34.0)
MCHC: 31.8 g/dL (ref 30.0–36.0)
MCV: 90.4 fL (ref 80.0–100.0)
Monocytes Absolute: 1.4 10*3/uL — ABNORMAL HIGH (ref 0.1–1.0)
Monocytes Relative: 5 %
Neutro Abs: 24.6 10*3/uL — ABNORMAL HIGH (ref 1.7–7.7)
Neutrophils Relative %: 89 %
Platelets: 232 10*3/uL (ref 150–400)
RBC: 4.8 MIL/uL (ref 3.87–5.11)
RDW: 13.8 % (ref 11.5–15.5)
Smear Review: NORMAL
WBC: 27.8 10*3/uL — ABNORMAL HIGH (ref 4.0–10.5)
nRBC: 0 % (ref 0.0–0.2)

## 2018-10-25 LAB — HCG, QUANTITATIVE, PREGNANCY: hCG, Beta Chain, Quant, S: <1 m[IU]/mL (ref <5)

## 2018-10-25 LAB — SARS CORONAVIRUS 2 (TAT 6-24 HRS): SARS Coronavirus 2: NEGATIVE

## 2018-10-25 LAB — LIPASE, BLOOD: Lipase: 28 U/L (ref 11–51)

## 2018-10-25 MED ORDER — MORPHINE SULFATE (PF) 2 MG/ML IV SOLN
2.0000 mg | INTRAVENOUS | Status: DC | PRN
  Administered 2018-10-25: 2 mg via INTRAVENOUS
  Administered 2018-10-26 (×3): 4 mg via INTRAVENOUS
  Filled 2018-10-25 (×2): qty 2
  Filled 2018-10-25: qty 1
  Filled 2018-10-25: qty 2

## 2018-10-25 MED ORDER — MORPHINE SULFATE (PF) 2 MG/ML IV SOLN
2.0000 mg | INTRAVENOUS | Status: DC | PRN
  Administered 2018-10-25: 2 mg via INTRAVENOUS
  Administered 2018-10-25: 14:00:00 4 mg via INTRAVENOUS
  Filled 2018-10-25 (×2): qty 2

## 2018-10-25 MED ORDER — METRONIDAZOLE IN NACL 5-0.79 MG/ML-% IV SOLN
500.0000 mg | Freq: Three times a day (TID) | INTRAVENOUS | Status: DC
  Administered 2018-10-25 – 2018-10-26 (×3): 500 mg via INTRAVENOUS
  Filled 2018-10-25 (×4): qty 100

## 2018-10-25 MED ORDER — SODIUM CHLORIDE 0.45 % IV SOLN
INTRAVENOUS | Status: DC
  Administered 2018-10-25 – 2018-10-28 (×7): via INTRAVENOUS

## 2018-10-25 MED ORDER — IOHEXOL 300 MG/ML  SOLN
100.0000 mL | Freq: Once | INTRAMUSCULAR | Status: AC | PRN
  Administered 2018-10-25: 07:00:00 100 mL via INTRAVENOUS

## 2018-10-25 MED ORDER — METRONIDAZOLE IN NACL 5-0.79 MG/ML-% IV SOLN: 500.0000 mg | Freq: Three times a day (TID) | INTRAVENOUS | Status: DC

## 2018-10-25 MED ORDER — ONDANSETRON HCL 4 MG/2ML IJ SOLN
4.0000 mg | Freq: Four times a day (QID) | INTRAMUSCULAR | Status: DC | PRN
  Administered 2018-10-25 – 2018-10-30 (×12): 4 mg via INTRAVENOUS
  Filled 2018-10-25 (×12): qty 2

## 2018-10-25 MED ORDER — HYDROCODONE-ACETAMINOPHEN 5-325 MG PO TABS: 1.0000 | ORAL_TABLET | ORAL | Status: DC | PRN

## 2018-10-25 MED ORDER — POLYETHYLENE GLYCOL 3350 17 G PO PACK
17.0000 g | PACK | Freq: Every day | ORAL | Status: DC | PRN
  Filled 2018-10-25: qty 1

## 2018-10-25 MED ORDER — CIPROFLOXACIN IN D5W 400 MG/200ML IV SOLN
400.0000 mg | Freq: Once | INTRAVENOUS | Status: AC
  Administered 2018-10-25: 14:00:00 400 mg via INTRAVENOUS
  Filled 2018-10-25: qty 200

## 2018-10-25 MED ORDER — FLEET ENEMA 7-19 GM/118ML RE ENEM: 1.0000 | ENEMA | Freq: Once | RECTAL | Status: DC | PRN

## 2018-10-25 MED ORDER — MORPHINE SULFATE (PF) 4 MG/ML IV SOLN
4.0000 mg | Freq: Once | INTRAVENOUS | Status: AC
  Administered 2018-10-25: 09:00:00 4 mg via INTRAVENOUS
  Filled 2018-10-25: qty 1

## 2018-10-25 MED ORDER — ENOXAPARIN SODIUM 40 MG/0.4ML ~~LOC~~ SOLN
40.0000 mg | SUBCUTANEOUS | Status: DC
  Administered 2018-10-25: 40 mg via SUBCUTANEOUS
  Filled 2018-10-25: qty 0.4

## 2018-10-25 MED ORDER — CIPROFLOXACIN IN D5W 400 MG/200ML IV SOLN
400.0000 mg | Freq: Two times a day (BID) | INTRAVENOUS | Status: DC
  Administered 2018-10-26: 01:00:00 400 mg via INTRAVENOUS
  Filled 2018-10-25 (×2): qty 200

## 2018-10-25 MED ORDER — CIPROFLOXACIN IN D5W 400 MG/200ML IV SOLN: 400.0000 mg | Freq: Two times a day (BID) | INTRAVENOUS | Status: DC

## 2018-10-25 MED ORDER — ACETAMINOPHEN 650 MG RE SUPP: 650.0000 mg | Freq: Four times a day (QID) | RECTAL | Status: DC | PRN

## 2018-10-25 MED ORDER — MORPHINE SULFATE (PF) 4 MG/ML IV SOLN
4.0000 mg | Freq: Once | INTRAVENOUS | Status: AC
  Administered 2018-10-25: 4 mg via INTRAVENOUS
  Filled 2018-10-25: qty 1

## 2018-10-25 MED ORDER — SODIUM CHLORIDE 0.9 % IV BOLUS
1000.0000 mL | Freq: Once | INTRAVENOUS | Status: AC
  Administered 2018-10-25: 06:00:00 1000 mL via INTRAVENOUS

## 2018-10-25 MED ORDER — ACETAMINOPHEN 325 MG PO TABS: 650.0000 mg | ORAL_TABLET | Freq: Four times a day (QID) | ORAL | Status: DC | PRN

## 2018-10-25 MED ORDER — ONDANSETRON HCL 4 MG PO TABS: 4.0000 mg | ORAL_TABLET | Freq: Four times a day (QID) | ORAL | Status: DC | PRN

## 2018-10-25 MED ORDER — ONDANSETRON HCL 4 MG/2ML IJ SOLN
INTRAMUSCULAR | Status: AC
  Filled 2018-10-25: qty 2

## 2018-10-25 MED ORDER — METRONIDAZOLE IN NACL 5-0.79 MG/ML-% IV SOLN
500.0000 mg | Freq: Once | INTRAVENOUS | Status: AC
  Administered 2018-10-25: 12:00:00 500 mg via INTRAVENOUS
  Filled 2018-10-25: qty 100

## 2018-10-25 MED ORDER — ENOXAPARIN SODIUM 40 MG/0.4ML ~~LOC~~ SOLN: 40.0000 mg | SUBCUTANEOUS | Status: DC

## 2018-10-25 MED ORDER — ONDANSETRON HCL 4 MG/2ML IJ SOLN
4.0000 mg | Freq: Once | INTRAMUSCULAR | Status: AC
  Administered 2018-10-25: 06:00:00 4 mg via INTRAVENOUS

## 2018-10-25 NOTE — ED Provider Notes (Signed)
[facility] EMERGENCY DEPARTMENT Provider Note   CSN: [id number] Arrival date & time: 10/25/18  0536    History   Chief Complaint Chief Complaint  Patient presents with  . abdominal pain    HPI Sydney Alvarado is a 53 y.o. female.     HPI  This is a 53 year old female who presents with abdominal pain.  Onset of symptoms yesterday.  She reports pain started over her lower abdomen.  It is sharp and at times stabbing.  She states that it is constant but that the intensity comes and goes.  She has had some intermittent left back pain as well but none at this time.  She rates her pain at 15 out of 10.  Patient has not taken anything for pain.  Nothing seems to make it better or worse.  She reports multiple episodes of nonbilious, nonbloody emesis and dry heaving.  She reports decreased urination.  No dysuria or hematuria.  No known history of kidney stones.  Reports chills without fevers.  Past Medical History:  Diagnosis Date  . Allergy     There are no active problems to display for this patient.   Past Surgical History:  Procedure Laterality Date  . ANTERIOR CRUCIATE LIGAMENT REPAIR    . DILATION AND CURETTAGE OF UTERUS    . MOUTH SURGERY    . PLANTAR FASCIECTOMY       OB History   No obstetric history on file.      Home Medications    Prior to Admission medications   Medication Sig Start Date End Date Taking? Authorizing Provider  cephALEXin (KEFLEX) 500 MG capsule Take 1 capsule (500 mg total) by mouth 2 (two) times daily. 07/21/18   Evelina Bucy, DPM  cetirizine (ZYRTEC) 10 MG tablet Take 10 mg by mouth daily.    [provider]  glycopyrrolate (ROBINUL) 1 MG tablet Take 1 mg by mouth daily. 10/07/16   [provider]  meloxicam (MOBIC) 15 MG tablet Take 1 tablet (15 mg total) by mouth daily. 05/28/17   Evelina Bucy, DPM    Family History Family History  Problem Relation Age of Onset  . Diabetes Mother   . Hypertension Mother    . Hypertension Father   . Cancer Father        skin cancer  . Hypertension Brother   . Hypertension Maternal Grandmother   . Heart disease Maternal Grandfather        heart attack  . Hypertension Paternal Grandmother   . Stroke Paternal Grandmother   . Cancer Paternal Grandfather     Social History Social History   Tobacco Use  . Smoking status: Never Smoker  . Smokeless tobacco: Never Used  Substance Use Topics  . Alcohol use: No  . Drug use: No     Allergies   Sulfa antibiotics   Review of Systems Review of Systems  Constitutional: Positive for chills. Negative for fever.  Respiratory: Negative for shortness of breath.   Cardiovascular: Negative for chest pain.  Gastrointestinal: Positive for abdominal pain, nausea and vomiting. Negative for blood in stool, constipation and diarrhea.  Genitourinary: Positive for difficulty urinating. Negative for dysuria, flank pain and hematuria.  Musculoskeletal: Positive for back pain.  Neurological: Negative for dizziness.  All other systems reviewed and are negative.    Physical Exam Updated Vital Signs BP (!) 109/52 (BP Location: Right Arm)   Pulse (!) 59   Resp 16   Ht 1.626 m (  5\' 4" )   Wt 90.7 kg   LMP 10/01/2018   SpO2 98%   BMI 34.33 kg/m   Physical Exam Vitals signs and nursing note reviewed.  Constitutional:      Appearance: She is well-developed. She is obese.     Comments: Uncomfortable appearing but nontoxic  HENT:     Head: Normocephalic and atraumatic.  Eyes:     Pupils: Pupils are equal, round, and reactive to light.  Neck:     Musculoskeletal: Neck supple.  Cardiovascular:     Rate and Rhythm: Normal rate and regular rhythm.     Heart sounds: Normal heart sounds.  Pulmonary:     Effort: Pulmonary effort is normal. No respiratory distress.     Breath sounds: No wheezing.  Abdominal:     General: Bowel sounds are normal.     Palpations: Abdomen is soft.     Tenderness: There is abdominal  tenderness.     Comments: Diffuse tenderness to palpation, no rebound or guarding  Musculoskeletal:     Right lower leg: No edema.     Left lower leg: No edema.  Skin:    General: Skin is warm and dry.  Neurological:     Mental Status: She is alert and oriented to person, place, and time.  Psychiatric:        Mood and Affect: Mood normal.      ED Treatments / Results  Labs (all labs ordered are listed, but only abnormal results are displayed) Labs Reviewed  CBC WITH DIFFERENTIAL/PLATELET - Abnormal; Notable for the following components:      Result Value   WBC 27.8 (*)    All other components within normal limits  COMPREHENSIVE METABOLIC PANEL  LIPASE, BLOOD  URINALYSIS, ROUTINE W REFLEX MICROSCOPIC  PREGNANCY, URINE    EKG None  Radiology No results found.  Procedures Procedures (including critical care time)  Medications Ordered in ED Medications  morphine 4 MG/ML injection 4 mg (4 mg Intravenous Given 10/25/18 0614)  ondansetron (ZOFRAN) injection 4 mg (4 mg Intravenous Given 10/25/18 0614)  sodium chloride 0.9 % bolus 1,000 mL (1,000 mLs Intravenous New Bag/Given 10/25/18 9470)     Initial Impression / Assessment and Plan / ED Course  I have reviewed the triage vital signs and the nursing notes.  Pertinent labs & imaging results that were available during my care of the patient were reviewed by me and considered in my medical decision making (see chart for details).        Patient presents with abdominal pain.  Onset yesterday.  She appears very uncomfortable but is nontoxic.  She is tender at the lower quadrants of the abdomen.  Does not lateralize.  Vital signs are reassuring.  She reports chills without fevers.  Considerations include urinary tract infection, appendicitis, ovarian pathology.  Patient was given pain and nausea medication.  Lab work obtained.  Lab work initially notable for a leukocytosis to 27.  This would be concerning for possible  infection.  She is yet to provide a urine.  Will obtain a CT scan of the abdomen to rule out appendicitis.  Patient will be signed out to oncoming provider.    Final Clinical Impressions(s) / ED Diagnoses   Final diagnoses:  Lower abdominal pain    ED Discharge Orders    None       Merryl Hacker, MD 10/25/18 [phone]

## 2018-10-25 NOTE — Progress Notes (Signed)
Patient arrived to room 1537 from [facility]. Donne Hazel, RN

## 2018-10-25 NOTE — ED Triage Notes (Addendum)
C/o general lower abd pain that started yesterday morning. Dry heaving since 4am. Took zofran 4mg  at 5am.  Denies any radiation of pain. Describes as sharp and comes and goes. Fevers unknown. decreased urination. Denies history of kidney stones. Has not taking anything for pain pta. Has a recent knee left surgery. Has not eaten since yesterday morning. Pt moaning in pain on arrival and MD at bedside.

## 2018-10-25 NOTE — ED Notes (Signed)
Patient transported to Ultrasound 

## 2018-10-25 NOTE — ED Notes (Signed)
To CT scan

## 2018-10-25 NOTE — ED Notes (Signed)
Pt is aware that a urine sample needs to be obtained

## 2018-10-25 NOTE — Progress Notes (Addendum)
Patient accepted for admit to [facility] from [facility] for acute diverticulitis. She has had abd pain , WBC28k, CT +for sigmoid diverticulitis w/ microperf, no abscess. Does not appear septic. Otherwise healthy. Started on IVF's and IV cipro/ flagyl.   Rob Doctor, hospital / [facility]  pgr [phone] 10/25/2018, 12:49 PM

## 2018-10-25 NOTE — ED Notes (Signed)
Report to next shift

## 2018-10-25 NOTE — H&P (Signed)
[facility] History and Physical  Sydney Alvarado:[id number] DOB: 1965-05-24 DOA: 10/25/2018  Referring physician: Dr C. Horton  PCP: Patient, No Pcp Per   Chief Complaint: Lower abd pain, ^^WBC  HPI: Sydney Alvarado is a 53 y.o. female w/ no sig PMH presented to ED w 24-48 hr hx of lower anterior abd pain w/ N/V.  Pt went to ER at [facility] and initially they did CT scan which was negative, then did pelvic US looking for ovarian cyst , then the re-read of the CT scan showed diverticulitis in the distal sigmoid area w/ microperforation.  Pt was given IV abx and is now admitted at [facility].    Patient states she said somewhat sudden onset of lower SP bilat ant abd pains yesterday morning. It hurt to void and sometimes she felt like voiding but couldn't. She developed N/V and then later dry heaves. The pain kept getting worse throughout the day , the skin became severely sensitive to touch and she laid in bed all evening. Pain became intolerable around midnight and about 4 am she came to ED.   Pt states had a similar episode while in [place] 1.5 yrs ago w/ LLQ pain , vomiting and diarrhea and was seen in an UC clinic and rx'd as prob diverticulitis, symptoms lasted around 2 weeks that time.    She had knee surgery on 7/29 on the L knee ( 5th surgery for what was originally a tibial head fracture).  No issues w/ the knee today.    In ED pt had tempo 99 , WBC 27 K, normal chem panel, normal LFT.s  BP's are wnl and HR is 73.      ROS  denies CP  no joint pain   no HA  no blurry vision  no rash  no diarrhea  no change in urine color    Past Medical History  Past Medical History:  Diagnosis Date  . Allergy    Past Surgical History  Past Surgical History:  Procedure Laterality Date  . ANTERIOR CRUCIATE LIGAMENT REPAIR    . DILATION AND CURETTAGE OF UTERUS    . MOUTH SURGERY    . PLANTAR FASCIECTOMY     Family History  Family History  Problem Relation Age of Onset  . Diabetes Mother   .  Hypertension Mother   . Hypertension Father   . Cancer Father        skin cancer  . Hypertension Brother   . Hypertension Maternal Grandmother   . Heart disease Maternal Grandfather        heart attack  . Hypertension Paternal Grandmother   . Stroke Paternal Grandmother   . Cancer Paternal Grandfather    Social History  reports that she has never smoked. She has never used smokeless tobacco. She reports that she does not drink alcohol or use drugs. Allergies  Allergies  Allergen Reactions  . Sulfa Antibiotics Hives   Home medications Prior to Admission medications   Medication Sig Start Date End Date Taking? Authorizing Provider  calcium carbonate (TUMS EX) 750 MG chewable tablet Chew 2 tablets by mouth daily as needed for heartburn.   Yes [provider]  diclofenac (VOLTAREN) 50 MG EC tablet Take 50 mg by mouth 2 (two) times daily as needed for mild pain.  10/17/18  Yes [provider]  HYDROcodone-acetaminophen (NORCO/VICODIN) 5-325 MG tablet Take 1 tablet by mouth every 6 (six) hours as needed for moderate pain.  10/13/18  Yes  [provider]  Multiple Vitamins-Minerals (MULTIVITAMIN GUMMIES WOMENS) CHEW Chew 2 each by mouth daily.   Yes [provider]  ondansetron (ZOFRAN) 4 MG tablet Take 4 mg by mouth every 8 (eight) hours as needed for nausea or vomiting.   Yes [provider]  meloxicam (MOBIC) 15 MG tablet Take 1 tablet (15 mg total) by mouth daily. Patient not taking: Reported on 10/25/2018 05/28/17   Evelina Bucy, DPM   Liver Function Tests Recent Labs  Lab 10/25/18 0625  AST 18  ALT 22  ALKPHOS 66  BILITOT 1.0  PROT 8.1  ALBUMIN 4.0   Recent Labs  Lab 10/25/18 0625  LIPASE 28   CBC Recent Labs  Lab 10/25/18 0625  WBC 27.8*  NEUTROABS 24.6*  HGB 13.8  HCT 43.4  MCV 90.4  PLT 967   Basic Metabolic Panel Recent Labs  Lab 10/25/18 0625  NA 137  K 4.6  CL 101  CO2 22  GLUCOSE 129*  BUN 9  CREATININE  0.89  CALCIUM 9.7     Vitals:   10/25/18 0800 10/25/18 0851 10/25/18 1146 10/25/18 1525  BP: (!) 119/56  110/65 126/69  Pulse: 66 76 78 73  Resp: 16 16 16 17   Temp:    99 F (37.2 C)  TempSrc:    Oral  SpO2: 95% 97% 96% 98%  Weight:      Height:       Exam: Gen alert, no distress No rash, cyanosis or gangrene Sclera anicteric, throat clear  No jvd or bruits Chest clear bilat RRR no MRG Abd soft upper quadrants, tender bilat LQ and SP region, no mass or ascites +bs GU defer MS no joint effusions or deformity Ext no LE edema / no wounds or ulcers Neuro is alert, Ox 3 , nf     Home meds:   - prn's/ vitamins/ supplements  UA - negative   Pelvic US > IMPRESSION: 1. Normal pelvic ultrasound. The uterus and both ovaries appear Normal.   2. To my review of the prior CT, there is soft tissue stranding in the pelvis surrounding the sigmoid colon. There are tiny extraluminal air bubbles, and these findings are consistent with acute diverticulitis. No evidence of abscess or bowel obstruction.   CT abd /pelvis addendum > ADDENDUM: Original report read by Dr. Jasmine December. Addendum by Dr. Lin Landsman. This case was reviewed at the time of interpretation of subsequent pelvic ultrasound. There is soft tissue stranding in the pelvic fat surrounding the sigmoid colon which demonstrates multiple diverticula. There are tiny extraluminal air bubbles consistent with acute diverticulitis and microperforation. No abscess or bowel obstruction identified. The appendix appears normal.   Assessment: 1. Acute diverticulitis w/ microperforation - w/ WBC 22k. No other sig PMH. Plan is IVF"s, IV abx w/ cipro/ flagyl. NPO except ice chips for now.  2. Recent L knee surgery - knee appears benign on exam   Plan - as above   DVT prophylaxis: lovenox Family communication: husband in room Code status: full Admit status: INP Bed type: med surg    Kelly Splinter MD  [facility]  [phone] 10/25/2018, 6:56 PM  If 7PM-7AM, please contact night-coverage [email or web address] Password [facility] 10/25/2018, 6:56 PM

## 2018-10-25 NOTE — ED Notes (Signed)
Pt states she feels much better. Aware that she will be having a CT scan.

## 2018-10-26 ENCOUNTER — Encounter (HOSPITAL_COMMUNITY): Payer: Self-pay | Admitting: General Surgery

## 2018-10-26 DIAGNOSIS — K5720 Diverticulitis of large intestine with perforation and abscess without bleeding: Principal | ICD-10-CM

## 2018-10-26 LAB — BASIC METABOLIC PANEL
Anion gap: 7 (ref 5–15)
BUN: 9 mg/dL (ref 6–20)
CO2: 23 mmol/L (ref 22–32)
Calcium: 8.2 mg/dL — ABNORMAL LOW (ref 8.9–10.3)
Chloride: 101 mmol/L (ref 98–111)
Creatinine, Ser: 0.77 mg/dL (ref 0.44–1.00)
GFR calc Af Amer: >60 mL/min (ref >60)
GFR calc non Af Amer: >60 mL/min (ref >60)
Glucose, Bld: 120 mg/dL — ABNORMAL HIGH (ref 70–99)
Potassium: 3.9 mmol/L (ref 3.5–5.1)
Sodium: 131 mmol/L — ABNORMAL LOW (ref 135–145)

## 2018-10-26 LAB — CBC
HCT: 35.4 % — ABNORMAL LOW (ref 36.0–46.0)
Hemoglobin: 11.1 g/dL — ABNORMAL LOW (ref 12.0–15.0)
MCH: 29.1 pg (ref 26.0–34.0)
MCHC: 31.4 g/dL (ref 30.0–36.0)
MCV: 92.7 fL (ref 80.0–100.0)
Platelets: 186 10*3/uL (ref 150–400)
RBC: 3.82 MIL/uL — ABNORMAL LOW (ref 3.87–5.11)
RDW: 14 % (ref 11.5–15.5)
WBC: 25.2 10*3/uL — ABNORMAL HIGH (ref 4.0–10.5)
nRBC: 0 % (ref 0.0–0.2)

## 2018-10-26 LAB — HIV ANTIBODY (ROUTINE TESTING W REFLEX): HIV Screen 4th Generation wRfx: NONREACTIVE

## 2018-10-26 MED ORDER — HYDROMORPHONE HCL 1 MG/ML IJ SOLN
0.5000 mg | INTRAMUSCULAR | Status: DC | PRN
  Administered 2018-10-26 – 2018-10-30 (×13): 1 mg via INTRAVENOUS
  Filled 2018-10-26 (×15): qty 1

## 2018-10-26 MED ORDER — PIPERACILLIN-TAZOBACTAM 3.375 G IVPB
3.3750 g | Freq: Three times a day (TID) | INTRAVENOUS | Status: DC
  Administered 2018-10-26 – 2018-10-30 (×12): 3.375 g via INTRAVENOUS
  Filled 2018-10-26 (×12): qty 50

## 2018-10-26 NOTE — Progress Notes (Signed)
Dr Sarajane Jews paged to make him aware of patient pain. Unrelieved with morphine. Donne Hazel, RN

## 2018-10-26 NOTE — Progress Notes (Signed)
PROGRESS NOTE  Sydney Alvarado:[id number] DOB: December 13, 1965 DOA: 10/25/2018 PCP: Patient, No Pcp Per  Brief History   53 year old woman no significant PMH presented with 24 hours of severe, excruciating lower abdominal pain with associated nausea and vomiting.  Initial CT was read as negative but upon reread showed diverticulitis in the distal sigmoid area with microperforation.  Admitted for IV antibiotics.  A & P  Acute sigmoid diverticulitis with microperforation with associated severe lower abdominal pain.  May have had diverticulitis 1.5 years ago which was treated as an outpatient.  Had a colonoscopy August 2019, report not available but there is a notation of diverticulosis and hyperplastic polyps. --She has some rebound on examination and severe lower abdominal pain.  Afebrile vital signs are stable.  LFTs unremarkable.  WBC with modest improvement at 25.2. --Continue IV antibiotics.  Stop morphine and start Dilaudid for better pain control.  Continue antiemetics as needed.  Given the degree of her pain, will ask surgery to follow.  Recent knee surgery  Resolved Hospital Problem list       DVT prophylaxis: SCDs Code Status: Full Family Communication: none Disposition Plan: home    Murray Hodgkins, MD  [facility] Direct contact: see [email or web address] (further directions at bottom of note if needed) 7PM-7AM contact night coverage as at bottom of note 10/26/2018, 10:40 AM  LOS: 1 day   Significant Hospital Events   . 8/10 admitted for acute diverticulitis with microperforation   Consults:  . General surgery   Procedures:  .   Significant Diagnostic Tests:  . 8/10 CT abdomen pelvis acute sigmoid diverticulitis with microperforation.  No abscess or bowel obstruction. . 10 pelvic ultrasound, normal   Micro Data:  . SARS-CoV-2 negative   Antimicrobials:  . Ciprofloxacin 8/10 > . Flagyl 8/10 >  Interval History/Subjective  No change in pain, still excruciating,  lower abdomen.  Severe pain with any movement in bed and walking is unbearable.  No nausea or vomiting.  Would like some ice chips.  Objective   Vitals:  Vitals:   10/26/18 0155 10/26/18 0504  BP: 131/64 (!) 112/58  Pulse: 68 66  Resp: 18 18  Temp: 98.6 F (37 C) 98.9 F (37.2 C)  SpO2: 92% 94%    Exam:  Constitutional:  . Appears calm uncomfortable, nontoxic Respiratory:  . CTA bilaterally, no w/r/r.  . Respiratory effort normal.  Cardiovascular:  . RRR, no m/r/g . No LE extremity edema   Abdomen:  . Skin appears unremarkable. . There is mild generalized tenderness.  There is exquisite tenderness over the lower abdomen particularly suprapubic.  There is some rebound with palpation of the left lower quadrant. Psychiatric:  . Mental status o Mood, affect appropriate   I have personally reviewed the following:   Today's Data  . Urine output 1000 . Sodium 131, remainder BMP unremarkable.  WBC slightly improved, 25.2.   Scheduled Meds: Continuous Infusions: . sodium chloride 100 mL/hr at 10/26/18 0107  . ciprofloxacin 400 mg (10/26/18 0115)  . metronidazole 500 mg (10/26/18 0301)    Principal Problem:   Diverticulitis of large intestine with perforation without abscess Active Problems:   Abdominal pain   Acute diverticulitis   Leukocytosis   LOS: 1 day   How to contact the [facility] Attending or Consulting provider 7A - 7P or covering provider during after hours [place], for this patient?  1. Check the care team in [facility] and look for a) attending/consulting [place] provider listed and b) the  [place] team listed 2. Log into [email or web address] and use [facility] universal password to access. If you do not have the password, please contact the hospital operator. 3. Locate the [facility] provider you are looking for under [facility] and page to a number that you can be directly reached. 4. If you still have difficulty reaching the provider, please page the [facility] (Director on Call)  for the Hospitalists listed on amion for assistance.  Time spent greater than 35 minutes, greater than 50% counseling and coordination of care

## 2018-10-26 NOTE — Consult Note (Signed)
Denetta Fei Domenick Gong 02/19/1966  [id number].    Requesting MD: Dr. Carlean Jews Chief Complaint/Reason for Consult: diverticulitis with microperforation  HPI:  This is a pleasant healthy 53 yo white female who began having lower abdominal pain on Sunday.  It was sharp and persistent.  She developed some nausea, but no emesis.  She was dry heaving.  No diarrhea.  She had a normal BM on Sunday.  She denies any mucus or blood in her stool.  She had a episode similar about a year and a half ago with LLQ abdominal pain, but had N/V/D at that time.  She saw urgent care and was placed on abx therapy.  She saw Dr. Collene Mares upon return from vacation and had a colonoscopy which was normal.    She presented to the [facility] yesterday secondary to severe lower abdominal pain.  She was found to have a WBC of 27.8K and a CT that initially was read as some loculated fluid tracking along her adnexa at the level of the cecum.  The appendix appeared normal.  There was some suspicion of right adnexal cyst rupture.  She had a pelvic US then done that revealed some extraluminal locules of gas around the colon.  The CT was then an addendum was created that stated that patient likely had diverticulitis with a small microperforation.  She was admitted and placed on Cipro and Flagyl.  She denies any vaginal bleeding or discharge.  Due to persistent abdominal pain and an elevated WBC, we have been asked to see the patient for further recommendations.  ROS: ROS: Please see HPI, otherwise negative except she just had knee scope of the left knee on 10/12/18.  Family History  Problem Relation Age of Onset   Diabetes Mother    Hypertension Mother    Hypertension Father    Cancer Father        skin cancer   Hypertension Brother    Hypertension Maternal Grandmother    Heart disease Maternal Grandfather        heart attack   Hypertension Paternal Grandmother    Stroke Paternal Grandmother    Cancer Paternal Grandfather      Past Medical History:  Diagnosis Date   Allergy     Past Surgical History:  Procedure Laterality Date   ANTERIOR CRUCIATE LIGAMENT REPAIR     DILATION AND CURETTAGE OF UTERUS     KNEE SURGERY     multiple left knee surgery   MOUTH SURGERY     PLANTAR FASCIECTOMY      Social History:  reports that she has never smoked. She has never used smokeless tobacco. She reports that she does not drink alcohol or use drugs.  Allergies:  Allergies  Allergen Reactions   Sulfa Antibiotics Hives    Medications Prior to Admission  Medication Sig Dispense Refill   calcium carbonate (TUMS EX) 750 MG chewable tablet Chew 2 tablets by mouth daily as needed for heartburn.     diclofenac (VOLTAREN) 50 MG EC tablet Take 50 mg by mouth 2 (two) times daily as needed for mild pain.      HYDROcodone-acetaminophen (NORCO/VICODIN) 5-325 MG tablet Take 1 tablet by mouth every 6 (six) hours as needed for moderate pain.      Multiple Vitamins-Minerals (MULTIVITAMIN GUMMIES WOMENS) CHEW Chew 2 each by mouth daily.     ondansetron (ZOFRAN) 4 MG tablet Take 4 mg by mouth every 8 (eight) hours as needed for nausea or vomiting.  meloxicam (MOBIC) 15 MG tablet Take 1 tablet (15 mg total) by mouth daily. (Patient not taking: Reported on 10/25/2018) 15 tablet 0     Physical Exam: Blood pressure (!) 112/58, pulse 66, temperature 98.9 F (37.2 C), temperature source Oral, resp. rate 18, height 5\' 4"  (1.626 m), weight 90.7 kg, last menstrual period 10/01/2018, SpO2 94 %. General: pleasant, obese white female who is laying in bed in NAD, but just had pain meds HEENT: head is normocephalic, atraumatic.  Sclera are noninjected.  PERRL.  Ears and nose without any masses or lesions.  Mouth is pink and moist Heart: regular, rate, and rhythm.  Normal s1,s2. No obvious murmurs, gallops, or rubs noted.  Palpable radial and pedal pulses bilaterally Lungs: CTAB, no wheezes, rhonchi, or rales noted.   Respiratory effort nonlabored Abd: soft, very tender throughout her lower abdomen with some rebound.  Mild tenderness in the upper abdomen, ND, +BS, no masses, hernias, or organomegaly MS: all 4 extremities are symmetrical with no cyanosis, clubbing, or edema.  Incisions are healing well on left knee Skin: warm and dry with no masses, lesions, or rashes Psych: A&Ox3 with an appropriate affect.   Results for orders placed or performed during the hospital encounter of 10/25/18 (from the past 48 hour(s))  CBC with Differential     Status: Abnormal   Collection Time: 10/25/18  6:25 AM  Result Value Ref Range   WBC 27.8 (H) 4.0 - 10.5 K/uL   RBC 4.80 3.87 - 5.11 MIL/uL   Hemoglobin 13.8 12.0 - 15.0 g/dL   HCT 43.4 36.0 - 46.0 %   MCV 90.4 80.0 - 100.0 fL   MCH 28.8 26.0 - 34.0 pg   MCHC 31.8 30.0 - 36.0 g/dL   RDW 13.8 11.5 - 15.5 %   Platelets 232 150 - 400 K/uL   nRBC 0.0 0.0 - 0.2 %   Neutrophils Relative % 89 %   Neutro Abs 24.6 (H) 1.7 - 7.7 K/uL   Lymphocytes Relative 5 %   Lymphs Abs 1.5 0.7 - 4.0 K/uL   Monocytes Relative 5 %   Monocytes Absolute 1.4 (H) 0.1 - 1.0 K/uL   Eosinophils Relative 0 %   Eosinophils Absolute 0.0 0.0 - 0.5 K/uL   Basophils Relative 0 %   Basophils Absolute 0.1 0.0 - 0.1 K/uL   WBC Morphology MORPHOLOGY UNREMARKABLE    RBC Morphology MORPHOLOGY UNREMARKABLE    Smear Review Normal platelet morphology    Immature Granulocytes 1 %   Abs Immature Granulocytes 0.21 (H) 0.00 - 0.07 K/uL    Comment: Performed at [facility], [address] [place], [place] [address]  Comprehensive metabolic panel     Status: Abnormal   Collection Time: 10/25/18  6:25 AM  Result Value Ref Range   Sodium 137 135 - 145 mmol/L   Potassium 4.6 3.5 - 5.1 mmol/L   Chloride 101 98 - 111 mmol/L   CO2 22 22 - 32 mmol/L   Glucose, Bld 129 (H) 70 - 99 mg/dL   BUN 9 6 - 20 mg/dL   Creatinine, Ser 0.89 0.44 - 1.00 mg/dL   Calcium 9.7 8.9 - 10.3 mg/dL   Total Protein  8.1 6.5 - 8.1 g/dL   Albumin 4.0 3.5 - 5.0 g/dL   AST 18 15 - 41 U/L   ALT 22 0 - 44 U/L   Alkaline Phosphatase 66 38 - 126 U/L   Total Bilirubin 1.0 0.3 - 1.2 mg/dL  GFR calc non Af Amer >60 >60 mL/min   GFR calc Af Amer >60 >60 mL/min   Anion gap 14 5 - 15    Comment: Performed at [facility], [address] [place], [place] [address]  Lipase, blood     Status: None   Collection Time: 10/25/18  6:25 AM  Result Value Ref Range   Lipase 28 11 - 51 U/L    Comment: Performed at [facility], [address] [place], [place] [address]  hCG, quantitative, pregnancy     Status: None   Collection Time: 10/25/18  6:25 AM  Result Value Ref Range   hCG, Beta Chain, Quant, S <1 <5 mIU/mL    Comment:          GEST. AGE      CONC.  (mIU/mL)   <=1 WEEK        5 - 50     2 WEEKS       50 - 500     3 WEEKS       100 - 10,000     4 WEEKS     1,000 - 30,000     5 WEEKS     3,500 - 115,000   6-8 WEEKS     12,000 - 270,000    12 WEEKS     15,000 - 220,000        FEMALE AND NON-PREGNANT FEMALE:     LESS THAN 5 mIU/mL Performed at [facility], [address] [place], [place] [address]   Urinalysis, Routine w reflex microscopic     Status: Abnormal   Collection Time: 10/25/18  8:33 AM  Result Value Ref Range   Color, Urine YELLOW YELLOW   APPearance CLEAR CLEAR   Specific Gravity, Urine <1.005 (L) 1.005 - 1.030   pH 6.5 5.0 - 8.0   Glucose, UA NEGATIVE NEGATIVE mg/dL   Hgb urine dipstick NEGATIVE NEGATIVE   Bilirubin Urine NEGATIVE NEGATIVE   Ketones, ur NEGATIVE NEGATIVE mg/dL   Protein, ur NEGATIVE NEGATIVE mg/dL   Nitrite NEGATIVE NEGATIVE   Leukocytes,Ua NEGATIVE NEGATIVE    Comment: Microscopic not done on urines with negative protein, blood, leukocytes, nitrite, or glucose < 500 mg/dL. Performed at [facility], [address] [place], [place] [address]   SARS CORONAVIRUS 2 Nasal Swab Aptima Multi Swab     Status: None   Collection  Time: 10/25/18 12:05 PM   Specimen: Aptima Multi Swab; Nasal Swab  Result Value Ref Range   SARS Coronavirus 2 NEGATIVE NEGATIVE    Comment: (NOTE) SARS-CoV-2 target nucleic acids are NOT DETECTED. The SARS-CoV-2 RNA is generally detectable in upper and lower respiratory specimens during the acute phase of infection. Negative results do not preclude SARS-CoV-2 infection, do not rule out co-infections with other pathogens, and should not be used as the sole basis for treatment or other patient management decisions. Negative results must be combined with clinical observations, patient history, and epidemiological information. The expected result is Negative. Fact Sheet for Patients: SugarRoll.be Fact Sheet for Healthcare Providers: https://www.woods-mathews.com/ This test is not yet approved or cleared by the [place] FDA and  has been authorized for detection and/or diagnosis of SARS-CoV-2 by FDA under an Emergency Use Authorization (EUA). This EUA will remain  in effect (meaning this test can be used) for the duration of the COVID-19 declaration under Section 56 4(b)(1) of the Act, 21 U.S.C.  section 360bbb-3(b)(1), unless the authorization is terminated or revoked sooner. Performed at [facility], [place] [address], [place], [place] [address]   CBC     Status: Abnormal   Collection Time: 10/26/18  3:15 AM  Result Value Ref Range   WBC 25.2 (H) 4.0 - 10.5 K/uL   RBC 3.82 (L) 3.87 - 5.11 MIL/uL   Hemoglobin 11.1 (L) 12.0 - 15.0 g/dL   HCT 35.4 (L) 36.0 - 46.0 %   MCV 92.7 80.0 - 100.0 fL   MCH 29.1 26.0 - 34.0 pg   MCHC 31.4 30.0 - 36.0 g/dL   RDW 14.0 11.5 - 15.5 %   Platelets 186 150 - 400 K/uL   nRBC 0.0 0.0 - 0.2 %    Comment: Performed at [facility], [place] [address], [place], [place] [address]  Basic metabolic panel     Status: Abnormal   Collection Time: 10/26/18  3:15 AM  Result Value Ref Range    Sodium 131 (L) 135 - 145 mmol/L   Potassium 3.9 3.5 - 5.1 mmol/L   Chloride 101 98 - 111 mmol/L   CO2 23 22 - 32 mmol/L   Glucose, Bld 120 (H) 70 - 99 mg/dL   BUN 9 6 - 20 mg/dL   Creatinine, Ser 0.77 0.44 - 1.00 mg/dL   Calcium 8.2 (L) 8.9 - 10.3 mg/dL   GFR calc non Af Amer >60 >60 mL/min   GFR calc Af Amer >60 >60 mL/min   Anion gap 7 5 - 15    Comment: Performed at [facility], [place] [address] [place], [place] [address]   US Pelvis Transvaginal Non-ob (tv Only)  Result Date: 10/25/2018 CLINICAL DATA:  Pelvic pain for 1-2 days. Followup ruptured hemorrhagic cyst on CT. LMP 10/01/2018. EXAM: TRANSABDOMINAL AND TRANSVAGINAL ULTRASOUND OF PELVIS DOPPLER ULTRASOUND OF OVARIES TECHNIQUE: Both transabdominal and transvaginal ultrasound examinations of the pelvis were performed. Transabdominal technique was performed for global imaging of the pelvis including uterus, ovaries, adnexal regions, and pelvic cul-de-sac. It was necessary to proceed with endovaginal exam following the transabdominal exam to visualize the endometrium and ovaries to better advantage. Color and duplex Doppler ultrasound was utilized to evaluate blood flow to the ovaries. COMPARISON:  Abdominopelvic CT same date. FINDINGS: Uterus Measurements: 9.5 x 4.7 x 5.5 cm = volume: 129 mL. No fibroids or other mass visualized. Endometrium Thickness: 11 mm.  No focal abnormality visualized. Right ovary Measurements: 3.6 x 1.8 x 2.0 cm = volume: 6.9 mL. Normal appearance/no adnexal mass. Left ovary Measurements: 2.7 x 1.5 x 2.4 cm = volume: 5.1 mL. Normal appearance/no adnexal mass. Pulsed Doppler evaluation of both ovaries demonstrates normal low-resistance arterial and venous waveforms. Other findings There is a small amount of free pelvic fluid, within physiologic limits. IMPRESSION: 1. Normal pelvic ultrasound. The uterus and both ovaries appear normal. 2. To my review of the prior CT, there is soft tissue stranding in  the pelvis surrounding the sigmoid colon. There are tiny extraluminal air bubbles, and these findings are consistent with acute diverticulitis. No evidence of abscess or bowel obstruction. Electronically Signed   By: Richardean Sale M.D.   On: 10/25/2018 11:44   Ct Abdomen Pelvis W Contrast  Addendum Date: 10/25/2018   ADDENDUM REPORT: 10/25/2018 11:50 ADDENDUM: Original report read by Dr. Jasmine December. Addendum by Dr. Lin Landsman. This case was reviewed at the time of interpretation of subsequent pelvic ultrasound. There is soft tissue stranding in the pelvic fat  surrounding the sigmoid colon which demonstrates multiple diverticula. There are tiny extraluminal air bubbles consistent with acute diverticulitis and microperforation. No abscess or bowel obstruction identified. The appendix appears normal. These results were called by telephone at the time of interpretation on 10/25/2018 at 11:49 am to Dr. Alto Denver, who verbally acknowledged these results. Electronically Signed   By: Richardean Sale M.D.   On: 10/25/2018 11:50   Result Date: 10/25/2018 CLINICAL DATA:  Generalized abdominal pain EXAM: CT ABDOMEN AND PELVIS WITH CONTRAST TECHNIQUE: Multidetector CT imaging of the abdomen and pelvis was performed using the standard protocol following bolus administration of intravenous contrast. Oral contrast w was also administered. CONTRAST:  143mL OMNIPAQUE IOHEXOL 300 MG/ML  SOLN COMPARISON:  None. FINDINGS: Lower chest: There is bibasilar atelectatic change. No lung base edema or consolidation is evident. Hepatobiliary: There is an area of decreased attenuation in the left lobe of the liver measuring 3.2 x 2.7 cm. There is underlying hepatic steatosis. There is a probable cyst in the gallbladder fossa measuring 1.0 x 0.8 cm. No other focal liver lesions are identified. Gallbladder wall is not appreciably thickened. There is no biliary duct dilatation. Pancreas: There is no appreciable pancreatic mass or inflammatory  focus. Spleen: No splenic lesions are evident. Adrenals/Urinary Tract: Adrenals bilaterally appear unremarkable. Kidneys bilaterally show no evident mass or hydronephrosis on either side. There is no renal or ureteral calculus on either side. Bladder wall thickness is within normal limits. Stomach/Bowel: There are w scattered sigmoid diverticula without diverticulitis. There is no appreciable bowel wall or mesenteric thickening. There is moderate stool in the colon. The terminal ileum appears unremarkable. No bowel obstruction evident. No free air or portal venous air. Vascular/Lymphatic: There are foci of aortic atherosclerosis. No aneurysm evident. No evident adenopathy in the abdomen or pelvis. Reproductive: The uterus is anteverted. There is an apparent recent right ovarian cyst rupture with partial collapse. Remaining cyst measures 1.7 x 1.5 cm. There is fluid tracking from the right adnexa into the level of the cecum. Other: Appendix appears normal. No abscess is evident in the abdomen pelvis. No ascites is evident. There is a minimal umbilical hernia containing only fat. Musculoskeletal: There are no blastic or lytic bone lesions. No intramuscular lesions are evident IMPRESSION: 1. There is focal apparent loculated fluid tracking from the right adnexa to the level of the cecum. Nearby appendix does not appear involved and appears normal. Suspect recent right adnexal region cyst rupture with hemorrhagic fluid. No other pelvic mass. 2. Focal area of decreased attenuation in the left lobe of the liver measuring 3.2 x 2.7 cm. Etiology for this lesion uncertain. It may be prudent to consider liver MRI pre and serial post-contrast to further evaluate. If there is a contraindication MRI, CT pre and serial post-contrast would be the alternative imaging study of choice to further evaluate. 3.  Underlying hepatic steatosis. 4. Sigmoid diverticulosis. No bowel obstruction. No abscess in the abdomen or pelvis.  Electronically Signed: By: Lowella Grip III M.D. On: 10/25/2018 08:09   US Pelvic Doppler (torsion R/o Or Mass Arterial Flow)  Result Date: 10/25/2018 CLINICAL DATA:  Pelvic pain for 1-2 days. Followup ruptured hemorrhagic cyst on CT. LMP 10/01/2018. EXAM: TRANSABDOMINAL AND TRANSVAGINAL ULTRASOUND OF PELVIS DOPPLER ULTRASOUND OF OVARIES TECHNIQUE: Both transabdominal and transvaginal ultrasound examinations of the pelvis were performed. Transabdominal technique was performed for global imaging of the pelvis including uterus, ovaries, adnexal regions, and pelvic cul-de-sac. It was necessary to proceed with endovaginal exam following  the transabdominal exam to visualize the endometrium and ovaries to better advantage. Color and duplex Doppler ultrasound was utilized to evaluate blood flow to the ovaries. COMPARISON:  Abdominopelvic CT same date. FINDINGS: Uterus Measurements: 9.5 x 4.7 x 5.5 cm = volume: 129 mL. No fibroids or other mass visualized. Endometrium Thickness: 11 mm.  No focal abnormality visualized. Right ovary Measurements: 3.6 x 1.8 x 2.0 cm = volume: 6.9 mL. Normal appearance/no adnexal mass. Left ovary Measurements: 2.7 x 1.5 x 2.4 cm = volume: 5.1 mL. Normal appearance/no adnexal mass. Pulsed Doppler evaluation of both ovaries demonstrates normal low-resistance arterial and venous waveforms. Other findings There is a small amount of free pelvic fluid, within physiologic limits. IMPRESSION: 1. Normal pelvic ultrasound. The uterus and both ovaries appear normal. 2. To my review of the prior CT, there is soft tissue stranding in the pelvis surrounding the sigmoid colon. There are tiny extraluminal air bubbles, and these findings are consistent with acute diverticulitis. No evidence of abscess or bowel obstruction. Electronically Signed   By: Richardean Sale M.D.   On: 10/25/2018 11:44      Assessment/Plan Diverticulitis with microperforation The patient's abdominal exam is certainly  concerning.  It does not totally match up with her CT scan though.  Her WBC is elevated at 25K.  We will change her abx therapy from C/F to zosyn to see if this begins to help.  Her abdomen is still very soft so doubtful that she has a frank perforation or peritonitis as her vitals are also normal.  She will remain NPO.  We will follow her closely as if she doesn't improve, she may require repeat imaging vs surgical intervention.  We will follow the patient along with you for further recommendations.   FEN - NPO VTE - may have chemical prophylaxis from our standpoint ID - C/F 8/10 --> 8/11, Zosyn 8/11 --[place], [facility] 10/26/2018, 12:59 PM Pager: [phone]

## 2018-10-27 LAB — BASIC METABOLIC PANEL
Anion gap: 9 (ref 5–15)
BUN: 10 mg/dL (ref 6–20)
CO2: 24 mmol/L (ref 22–32)
Calcium: 8.2 mg/dL — ABNORMAL LOW (ref 8.9–10.3)
Chloride: 102 mmol/L (ref 98–111)
Creatinine, Ser: 0.73 mg/dL (ref 0.44–1.00)
GFR calc Af Amer: >60 mL/min (ref >60)
GFR calc non Af Amer: >60 mL/min (ref >60)
Glucose, Bld: 88 mg/dL (ref 70–99)
Potassium: 3.6 mmol/L (ref 3.5–5.1)
Sodium: 135 mmol/L (ref 135–145)

## 2018-10-27 LAB — CBC
HCT: 35.9 % — ABNORMAL LOW (ref 36.0–46.0)
Hemoglobin: 11.3 g/dL — ABNORMAL LOW (ref 12.0–15.0)
MCH: 28.9 pg (ref 26.0–34.0)
MCHC: 31.5 g/dL (ref 30.0–36.0)
MCV: 91.8 fL (ref 80.0–100.0)
Platelets: 198 10*3/uL (ref 150–400)
RBC: 3.91 MIL/uL (ref 3.87–5.11)
RDW: 13.9 % (ref 11.5–15.5)
WBC: 23.5 10*3/uL — ABNORMAL HIGH (ref 4.0–10.5)
nRBC: 0 % (ref 0.0–0.2)

## 2018-10-27 MED ORDER — ALUM & MAG HYDROXIDE-SIMETH 200-200-20 MG/5ML PO SUSP
30.0000 mL | ORAL | Status: DC | PRN
  Administered 2018-10-27 – 2018-10-29 (×3): 30 mL via ORAL
  Filled 2018-10-27 (×4): qty 30

## 2018-10-27 MED ORDER — HEPARIN SODIUM (PORCINE) 5000 UNIT/ML IJ SOLN
5000.0000 [IU] | Freq: Three times a day (TID) | INTRAMUSCULAR | Status: DC
  Administered 2018-10-27 – 2018-10-30 (×8): 5000 [IU] via SUBCUTANEOUS
  Filled 2018-10-27 (×7): qty 1

## 2018-10-27 MED ORDER — PROMETHAZINE HCL 25 MG/ML IJ SOLN
25.0000 mg | INTRAMUSCULAR | Status: DC | PRN
  Filled 2018-10-27: qty 1

## 2018-10-27 NOTE — Progress Notes (Signed)
Subjective/Chief Complaint: Pt reports worse abdominal pain last evening that improved after BM's Now her pain is less   Objective: Vital signs in last 24 hours: Temp:  [98.2 F (36.8 C)-98.9 F (37.2 C)] 98.2 F (36.8 C) (08/12 0640) Pulse Rate:  [67-73] 71 (08/12 0640) Resp:  [18] 18 (08/12 0640) BP: (126-153)/(62-75) 126/62 (08/12 0640) SpO2:  [90 %-95 %] 95 % (08/12 0640) Last BM Date: 10/24/18  Intake/Output from previous day: 08/11 0701 - 08/12 0700 In: 1822.3 [P.O.:240; I.V.:1523.4; IV Piggyback:58.9] Out: 4351 [Urine:4350; Stool:1] Intake/Output this shift: No intake/output data recorded.  Exam: Awake and alert Abdomen softer, still moderately tender but less than yesterday by my exam.  Definitely not rigid  Lab Results:  Recent Labs    10/26/18 0315 10/27/18 0323  WBC 25.2* 23.5*  HGB 11.1* 11.3*  HCT 35.4* 35.9*  PLT 186 198   BMET Recent Labs    10/26/18 0315 10/27/18 0323  NA 131* 135  K 3.9 3.6  CL 101 102  CO2 23 24  GLUCOSE 120* 88  BUN 9 10  CREATININE 0.77 0.73  CALCIUM 8.2* 8.2*   PT/INR No results for input(s): LABPROT, INR in the last 72 hours. ABG No results for input(s): PHART, HCO3 in the last 72 hours.  Invalid input(s): PCO2, PO2  Studies/Results: US Pelvis Transvaginal Non-ob (tv Only)  Result Date: 10/25/2018 CLINICAL DATA:  Pelvic pain for 1-2 days. Followup ruptured hemorrhagic cyst on CT. LMP 10/01/2018. EXAM: TRANSABDOMINAL AND TRANSVAGINAL ULTRASOUND OF PELVIS DOPPLER ULTRASOUND OF OVARIES TECHNIQUE: Both transabdominal and transvaginal ultrasound examinations of the pelvis were performed. Transabdominal technique was performed for global imaging of the pelvis including uterus, ovaries, adnexal regions, and pelvic cul-de-sac. It was necessary to proceed with endovaginal exam following the transabdominal exam to visualize the endometrium and ovaries to better advantage. Color and duplex Doppler ultrasound was utilized  to evaluate blood flow to the ovaries. COMPARISON:  Abdominopelvic CT same date. FINDINGS: Uterus Measurements: 9.5 x 4.7 x 5.5 cm = volume: 129 mL. No fibroids or other mass visualized. Endometrium Thickness: 11 mm.  No focal abnormality visualized. Right ovary Measurements: 3.6 x 1.8 x 2.0 cm = volume: 6.9 mL. Normal appearance/no adnexal mass. Left ovary Measurements: 2.7 x 1.5 x 2.4 cm = volume: 5.1 mL. Normal appearance/no adnexal mass. Pulsed Doppler evaluation of both ovaries demonstrates normal low-resistance arterial and venous waveforms. Other findings There is a small amount of free pelvic fluid, within physiologic limits. IMPRESSION: 1. Normal pelvic ultrasound. The uterus and both ovaries appear normal. 2. To my review of the prior CT, there is soft tissue stranding in the pelvis surrounding the sigmoid colon. There are tiny extraluminal air bubbles, and these findings are consistent with acute diverticulitis. No evidence of abscess or bowel obstruction. Electronically Signed   By: Richardean Sale M.D.   On: 10/25/2018 11:44   US Pelvic Doppler (torsion R/o Or Mass Arterial Flow)  Result Date: 10/25/2018 CLINICAL DATA:  Pelvic pain for 1-2 days. Followup ruptured hemorrhagic cyst on CT. LMP 10/01/2018. EXAM: TRANSABDOMINAL AND TRANSVAGINAL ULTRASOUND OF PELVIS DOPPLER ULTRASOUND OF OVARIES TECHNIQUE: Both transabdominal and transvaginal ultrasound examinations of the pelvis were performed. Transabdominal technique was performed for global imaging of the pelvis including uterus, ovaries, adnexal regions, and pelvic cul-de-sac. It was necessary to proceed with endovaginal exam following the transabdominal exam to visualize the endometrium and ovaries to better advantage. Color and duplex Doppler ultrasound was utilized to evaluate blood flow to the ovaries.  COMPARISON:  Abdominopelvic CT same date. FINDINGS: Uterus Measurements: 9.5 x 4.7 x 5.5 cm = volume: 129 mL. No fibroids or other mass  visualized. Endometrium Thickness: 11 mm.  No focal abnormality visualized. Right ovary Measurements: 3.6 x 1.8 x 2.0 cm = volume: 6.9 mL. Normal appearance/no adnexal mass. Left ovary Measurements: 2.7 x 1.5 x 2.4 cm = volume: 5.1 mL. Normal appearance/no adnexal mass. Pulsed Doppler evaluation of both ovaries demonstrates normal low-resistance arterial and venous waveforms. Other findings There is a small amount of free pelvic fluid, within physiologic limits. IMPRESSION: 1. Normal pelvic ultrasound. The uterus and both ovaries appear normal. 2. To my review of the prior CT, there is soft tissue stranding in the pelvis surrounding the sigmoid colon. There are tiny extraluminal air bubbles, and these findings are consistent with acute diverticulitis. No evidence of abscess or bowel obstruction. Electronically Signed   By: Richardean Sale M.D.   On: 10/25/2018 11:44    Anti-infectives: Anti-infectives (From admission, onward)   Start     Dose/Rate Route Frequency Ordered Stop   10/26/18 1400  piperacillin-tazobactam (ZOSYN) IVPB 3.375 g     3.375 g 12.5 mL/hr over 240 Minutes Intravenous Every 8 hours 10/26/18 1252     10/26/18 0200  ciprofloxacin (CIPRO) IVPB 400 mg  Status:  Discontinued     400 mg 200 mL/hr over 60 Minutes Intravenous Every 12 hours 10/25/18 1422 10/26/18 1252   10/25/18 2000  metroNIDAZOLE (FLAGYL) IVPB 500 mg  Status:  Discontinued     500 mg 100 mL/hr over 60 Minutes Intravenous Every 8 hours 10/25/18 1423 10/26/18 1252   10/25/18 1415  ciprofloxacin (CIPRO) IVPB 400 mg  Status:  Discontinued     400 mg 200 mL/hr over 60 Minutes Intravenous Every 12 hours 10/25/18 1403 10/25/18 1422   10/25/18 1415  metroNIDAZOLE (FLAGYL) IVPB 500 mg  Status:  Discontinued     500 mg 100 mL/hr over 60 Minutes Intravenous Every 8 hours 10/25/18 1403 10/25/18 1423   10/25/18 1200  ciprofloxacin (CIPRO) IVPB 400 mg     400 mg 200 mL/hr over 60 Minutes Intravenous  Once 10/25/18 1158  10/25/18 1516   10/25/18 1200  metroNIDAZOLE (FLAGYL) IVPB 500 mg     500 mg 100 mL/hr over 60 Minutes Intravenous  Once 10/25/18 1158 10/25/18 1403      Assessment/Plan:  Diverticulitis with microperforation  She has improved slightly clinically and her WBC is lower.  We again had a discussion about her diagnosis.  We are trying to avoid surgery which would require a colostomy. Given improvement, we will continue conservative management.  She agrees with the plan.  LOS: 2 days    Coralie Keens 10/27/2018

## 2018-10-27 NOTE — Progress Notes (Signed)
[facility]                                                                              Patient Demographics  Sydney Alvarado, is a 53 y.o. female, DOB - Aug 25, 1965, FAO:[id number]  Admit date - 10/25/2018   Admitting Physician Delano Metz, MD  Outpatient Primary MD for the patient is Patient, No Pcp Per  Outpatient specialists:   LOS - 2  days   Medical records reviewed and are as summarized below:    Chief Complaint  Patient presents with   abdominal pain       Brief summary   53 year old woman no significant PMH presented with 24 hours of severe, excruciating lower abdominal pain with associated nausea and vomiting.  Initial CT was read as negative but upon reread showed diverticulitis in the distal sigmoid area with microperforation.  Admitted for IV antibiotics.   Assessment & Plan    Acute sigmoid diverticulitis with microperforation -Presented with nausea vomiting and severe lower abdominal pain -Per patient had prior history of diverticulitis and colonoscopy in August 2019 -CT abdomen and pelvis showed focal apparent loculated fluid tracking from the right adnexa to the level of cecum, appendix normal, focal area of decreased attenuation in the left lobe of the liver measuring 3.2x 2.7 cm. Acute diverticulitis and microperforation -General surgery consulted, continue IV fluids, n.p.o., pain control  continue IV Zosyn  -For now, continue conservative management.  If no improvement, repeat CT abdomen and pelvis in 48 hours.  Recent knee surgery, left -Per patient she had knee surgery on 7/29.  Will obtain PT OT consult   Code Status: Full CODE STATUS DVT Prophylaxis: Heparin subcu Family Communication: Discussed in detail with the patient, all imaging results, lab results explained to the patient   Disposition Plan: Remains inpatient until able to tolerate diet, cleared by general surgery.  Time Spent in minutes   25 minutes  Procedures:    CT abdomen pelvis  Consultants:   General surgery  Antimicrobials:   Anti-infectives (From admission, onward)   Start     Dose/Rate Route Frequency Ordered Stop   10/26/18 1400  piperacillin-tazobactam (ZOSYN) IVPB 3.375 g     3.375 g 12.5 mL/hr over 240 Minutes Intravenous Every 8 hours 10/26/18 1252     10/26/18 0200  ciprofloxacin (CIPRO) IVPB 400 mg  Status:  Discontinued     400 mg 200 mL/hr over 60 Minutes Intravenous Every 12 hours 10/25/18 1422 10/26/18 1252   10/25/18 2000  metroNIDAZOLE (FLAGYL) IVPB 500 mg  Status:  Discontinued     500 mg 100 mL/hr over 60 Minutes Intravenous Every 8 hours 10/25/18 1423 10/26/18 1252   10/25/18 1415  ciprofloxacin (CIPRO) IVPB 400 mg  Status:  Discontinued     400 mg 200 mL/hr over 60 Minutes Intravenous Every 12 hours 10/25/18 1403 10/25/18 1422   10/25/18 1415  metroNIDAZOLE (FLAGYL) IVPB 500 mg  Status:  Discontinued     500 mg 100 mL/hr over 60 Minutes Intravenous Every 8 hours 10/25/18 1403 10/25/18 1423   10/25/18 1200  ciprofloxacin (CIPRO) IVPB 400 mg     400  mg 200 mL/hr over 60 Minutes Intravenous  Once 10/25/18 1158 10/25/18 1516   10/25/18 1200  metroNIDAZOLE (FLAGYL) IVPB 500 mg     500 mg 100 mL/hr over 60 Minutes Intravenous  Once 10/25/18 1158 10/25/18 1403          Medications  Scheduled Meds: Continuous Infusions:  sodium chloride 100 mL/hr at 10/27/18 1216   piperacillin-tazobactam (ZOSYN)  IV 3.375 g (10/27/18 1301)   PRN Meds:.acetaminophen **OR** acetaminophen, HYDROmorphone (DILAUDID) injection, ondansetron **OR** ondansetron (ZOFRAN) IV, promethazine      Subjective:   Sydney Alvarado was seen and examined today.  No vomiting or fevers however still has lower abdominal pain, 6/10, somewhat controlled with pain medications.  Difficulty ambulating due to recent knee surgery and abdominal pain. Patient denies dizziness, chest pain, shortness of breath. No acute events overnight.    Objective:    Vitals:   10/26/18 1355 10/26/18 2116 10/27/18 0640 10/27/18 1330  BP: (!) 153/75 131/74 126/62 107/67  Pulse: 67 73 71 (!) 58  Resp: 18 18 18 20   Temp: 98.8 F (37.1 C) 98.9 F (37.2 C) 98.2 F (36.8 C) 98.2 F (36.8 C)  TempSrc: Oral Oral Oral Oral  SpO2: 90% 92% 95% 90%  Weight:      Height:        Intake/Output Summary (Last 24 hours) at 10/27/2018 1656 Last data filed at 10/27/2018 1400 Gross per 24 hour  Intake 3072.07 ml  Output 3751 ml  Net -678.93 ml     Wt Readings from Last 3 Encounters:  10/25/18 90.7 kg  06/01/17 89.3 kg  10/09/15 88 kg     Exam  General: Alert and oriented x 3, NAD  Eyes:  HEENT:    Cardiovascular: S1 S2 auscultated, Regular rate and rhythm.  Respiratory: Clear to auscultation bilaterally, no wheezing, rales or rhonchi  Gastrointestinal: Soft, lower abdomen TTP, nondistended, + bowel sounds  Ext: no pedal edema bilaterally  Neuro: No new deficits  Musculoskeletal: No digital cyanosis, clubbing  Skin: No rashes  Psych: Normal affect and demeanor, alert and oriented x3    Data Reviewed:  I have personally reviewed following labs and imaging studies  Micro Results Recent Results (from the past 240 hour(s))  SARS CORONAVIRUS 2 Nasal Swab Aptima Multi Swab     Status: None   Collection Time: 10/25/18 12:05 PM   Specimen: Aptima Multi Swab; Nasal Swab  Result Value Ref Range Status   SARS Coronavirus 2 NEGATIVE NEGATIVE Final    Comment: (NOTE) SARS-CoV-2 target nucleic acids are NOT DETECTED. The SARS-CoV-2 RNA is generally detectable in upper and lower respiratory specimens during the acute phase of infection. Negative results do not preclude SARS-CoV-2 infection, do not rule out co-infections with other pathogens, and should not be used as the sole basis for treatment or other patient management decisions. Negative results must be combined with clinical observations, patient history, and epidemiological  information. The expected result is Negative. Fact Sheet for Patients: HairSlick.no Fact Sheet for Healthcare Providers: quierodirigir.com This test is not yet approved or cleared by the [place] FDA and  has been authorized for detection and/or diagnosis of SARS-CoV-2 by FDA under an Emergency Use Authorization (EUA). This EUA will remain  in effect (meaning this test can be used) for the duration of the COVID-19 declaration under Section 56 4(b)(1) of the Act, 21 U.S.C. section 360bbb-3(b)(1), unless the authorization is terminated or revoked sooner. Performed at [facility], [address],  [place], [place] [address]     Radiology Reports US Pelvis Transvaginal Non-ob (tv Only)  Result Date: 10/25/2018 CLINICAL DATA:  Pelvic pain for 1-2 days. Followup ruptured hemorrhagic cyst on CT. LMP 10/01/2018. EXAM: TRANSABDOMINAL AND TRANSVAGINAL ULTRASOUND OF PELVIS DOPPLER ULTRASOUND OF OVARIES TECHNIQUE: Both transabdominal and transvaginal ultrasound examinations of the pelvis were performed. Transabdominal technique was performed for global imaging of the pelvis including uterus, ovaries, adnexal regions, and pelvic cul-de-sac. It was necessary to proceed with endovaginal exam following the transabdominal exam to visualize the endometrium and ovaries to better advantage. Color and duplex Doppler ultrasound was utilized to evaluate blood flow to the ovaries. COMPARISON:  Abdominopelvic CT same date. FINDINGS: Uterus Measurements: 9.5 x 4.7 x 5.5 cm = volume: 129 mL. No fibroids or other mass visualized. Endometrium Thickness: 11 mm.  No focal abnormality visualized. Right ovary Measurements: 3.6 x 1.8 x 2.0 cm = volume: 6.9 mL. Normal appearance/no adnexal mass. Left ovary Measurements: 2.7 x 1.5 x 2.4 cm = volume: 5.1 mL. Normal appearance/no adnexal mass. Pulsed Doppler evaluation of both ovaries demonstrates normal low-resistance  arterial and venous waveforms. Other findings There is a small amount of free pelvic fluid, within physiologic limits. IMPRESSION: 1. Normal pelvic ultrasound. The uterus and both ovaries appear normal. 2. To my review of the prior CT, there is soft tissue stranding in the pelvis surrounding the sigmoid colon. There are tiny extraluminal air bubbles, and these findings are consistent with acute diverticulitis. No evidence of abscess or bowel obstruction. Electronically Signed   By: Carey Bullocks M.D.   On: 10/25/2018 11:44   Ct Abdomen Pelvis W Contrast  Addendum Date: 10/25/2018   ADDENDUM REPORT: 10/25/2018 11:50 ADDENDUM: Original report read by Dr. Margarita Grizzle. Addendum by Dr. Purcell Mouton. This case was reviewed at the time of interpretation of subsequent pelvic ultrasound. There is soft tissue stranding in the pelvic fat surrounding the sigmoid colon which demonstrates multiple diverticula. There are tiny extraluminal air bubbles consistent with acute diverticulitis and microperforation. No abscess or bowel obstruction identified. The appendix appears normal. These results were called by telephone at the time of interpretation on 10/25/2018 at 11:49 am to Dr. Theotis Barrio, who verbally acknowledged these results. Electronically Signed   By: Carey Bullocks M.D.   On: 10/25/2018 11:50   Result Date: 10/25/2018 CLINICAL DATA:  Generalized abdominal pain EXAM: CT ABDOMEN AND PELVIS WITH CONTRAST TECHNIQUE: Multidetector CT imaging of the abdomen and pelvis was performed using the standard protocol following bolus administration of intravenous contrast. Oral contrast w was also administered. CONTRAST:  OMNIPAQUE IOHEXOL 300 MG/ML  SOLN COMPARISON:  None. FINDINGS: Lower chest: There is bibasilar atelectatic change. No lung base edema or consolidation is evident. Hepatobiliary: There is an area of decreased attenuation in the left lobe of the liver measuring 3.2 x 2.7 cm. There is underlying hepatic steatosis.  There is a probable cyst in the gallbladder fossa measuring 1.0 x 0.8 cm. No other focal liver lesions are identified. Gallbladder wall is not appreciably thickened. There is no biliary duct dilatation. Pancreas: There is no appreciable pancreatic mass or inflammatory focus. Spleen: No splenic lesions are evident. Adrenals/Urinary Tract: Adrenals bilaterally appear unremarkable. Kidneys bilaterally show no evident mass or hydronephrosis on either side. There is no renal or ureteral calculus on either side. Bladder wall thickness is within normal limits. Stomach/Bowel: There are w scattered sigmoid diverticula without diverticulitis. There is no appreciable bowel wall or mesenteric thickening. There is moderate stool in the colon.  The terminal ileum appears unremarkable. No bowel obstruction evident. No free air or portal venous air. Vascular/Lymphatic: There are foci of aortic atherosclerosis. No aneurysm evident. No evident adenopathy in the abdomen or pelvis. Reproductive: The uterus is anteverted. There is an apparent recent right ovarian cyst rupture with partial collapse. Remaining cyst measures 1.7 x 1.5 cm. There is fluid tracking from the right adnexa into the level of the cecum. Other: Appendix appears normal. No abscess is evident in the abdomen pelvis. No ascites is evident. There is a minimal umbilical hernia containing only fat. Musculoskeletal: There are no blastic or lytic bone lesions. No intramuscular lesions are evident IMPRESSION: 1. There is focal apparent loculated fluid tracking from the right adnexa to the level of the cecum. Nearby appendix does not appear involved and appears normal. Suspect recent right adnexal region cyst rupture with hemorrhagic fluid. No other pelvic mass. 2. Focal area of decreased attenuation in the left lobe of the liver measuring 3.2 x 2.7 cm. Etiology for this lesion uncertain. It may be prudent to consider liver MRI pre and serial post-contrast to further evaluate.  If there is a contraindication MRI, CT pre and serial post-contrast would be the alternative imaging study of choice to further evaluate. 3.  Underlying hepatic steatosis. 4. Sigmoid diverticulosis. No bowel obstruction. No abscess in the abdomen or pelvis. Electronically Signed: By: Bretta Bang III M.D. On: 10/25/2018 08:09   US Pelvic Doppler (torsion R/o Or Mass Arterial Flow)  Result Date: 10/25/2018 CLINICAL DATA:  Pelvic pain for 1-2 days. Followup ruptured hemorrhagic cyst on CT. LMP 10/01/2018. EXAM: TRANSABDOMINAL AND TRANSVAGINAL ULTRASOUND OF PELVIS DOPPLER ULTRASOUND OF OVARIES TECHNIQUE: Both transabdominal and transvaginal ultrasound examinations of the pelvis were performed. Transabdominal technique was performed for global imaging of the pelvis including uterus, ovaries, adnexal regions, and pelvic cul-de-sac. It was necessary to proceed with endovaginal exam following the transabdominal exam to visualize the endometrium and ovaries to better advantage. Color and duplex Doppler ultrasound was utilized to evaluate blood flow to the ovaries. COMPARISON:  Abdominopelvic CT same date. FINDINGS: Uterus Measurements: 9.5 x 4.7 x 5.5 cm = volume: 129 mL. No fibroids or other mass visualized. Endometrium Thickness: 11 mm.  No focal abnormality visualized. Right ovary Measurements: 3.6 x 1.8 x 2.0 cm = volume: 6.9 mL. Normal appearance/no adnexal mass. Left ovary Measurements: 2.7 x 1.5 x 2.4 cm = volume: 5.1 mL. Normal appearance/no adnexal mass. Pulsed Doppler evaluation of both ovaries demonstrates normal low-resistance arterial and venous waveforms. Other findings There is a small amount of free pelvic fluid, within physiologic limits. IMPRESSION: 1. Normal pelvic ultrasound. The uterus and both ovaries appear normal. 2. To my review of the prior CT, there is soft tissue stranding in the pelvis surrounding the sigmoid colon. There are tiny extraluminal air bubbles, and these findings are  consistent with acute diverticulitis. No evidence of abscess or bowel obstruction. Electronically Signed   By: Carey Bullocks M.D.   On: 10/25/2018 11:44    Lab Data:  CBC: Recent Labs  Lab 10/25/18 0625 10/26/18 0315 10/27/18 0323  WBC 27.8* 25.2* 23.5*  NEUTROABS 24.6*  --   --   HGB 13.8 11.1* 11.3*  HCT 43.4 35.4* 35.9*  MCV 90.4 92.7 91.8  PLT 232 186 198   Basic Metabolic Panel: Recent Labs  Lab 10/25/18 0625 10/26/18 0315 10/27/18 0323  NA 137 131* 135  K 4.6 3.9 3.6  CL 101 101 102  CO2 22 23 24  GLUCOSE 129* 120* 88  BUN 9 9 10   CREATININE 0.89 0.77 0.73  CALCIUM 9.7 8.2* 8.2*   GFR: Estimated Creatinine Clearance: 90.8 mL/min (by C-G formula based on SCr of 0.73 mg/dL). Liver Function Tests: Recent Labs  Lab 10/25/18 0625  AST 18  ALT 22  ALKPHOS 66  BILITOT 1.0  PROT 8.1  ALBUMIN 4.0   Recent Labs  Lab 10/25/18 0625  LIPASE 28   No results for input(s): AMMONIA in the last 168 hours. Coagulation Profile: No results for input(s): INR, PROTIME in the last 168 hours. Cardiac Enzymes: No results for input(s): CKTOTAL, CKMB, CKMBINDEX, TROPONINI in the last 168 hours. BNP (last 3 results) No results for input(s): PROBNP in the last 8760 hours. HbA1C: No results for input(s): HGBA1C in the last 72 hours. CBG: No results for input(s): GLUCAP in the last 168 hours. Lipid Profile: No results for input(s): CHOL, HDL, LDLCALC, TRIG, CHOLHDL, LDLDIRECT in the last 72 hours. Thyroid Function Tests: No results for input(s): TSH, T4TOTAL, FREET4, T3FREE, THYROIDAB in the last 72 hours. Anemia Panel: No results for input(s): VITAMINB12, FOLATE, FERRITIN, TIBC, IRON, RETICCTPCT in the last 72 hours. Urine analysis:    Component Value Date/Time   COLORURINE YELLOW 10/25/2018 0833   APPEARANCEUR CLEAR 10/25/2018 0833   LABSPEC <1.005 (L) 10/25/2018 0833   PHURINE 6.5 10/25/2018 0833   GLUCOSEU NEGATIVE 10/25/2018 0833   HGBUR NEGATIVE 10/25/2018  0833   BILIRUBINUR NEGATIVE 10/25/2018 0833   BILIRUBINUR negative 06/12/2015 1123   KETONESUR NEGATIVE 10/25/2018 0833   PROTEINUR NEGATIVE 10/25/2018 0833   UROBILINOGEN 0.2 06/12/2015 1123   NITRITE NEGATIVE 10/25/2018 0833   LEUKOCYTESUR NEGATIVE 10/25/2018 6213     Adea Geisel M.D. [facility] 10/27/2018, 4:56 PM  Pager: [phone] Between 7am to 7pm - call Pager - [phone]  After 7pm go to [email or web address] - password [email or web address]  Call night coverage person covering after 7pm

## 2018-10-28 ENCOUNTER — Encounter: Payer: 59 | Admitting: Podiatry

## 2018-10-28 LAB — CBC
HCT: 37.7 % (ref 36.0–46.0)
Hemoglobin: 11.8 g/dL — ABNORMAL LOW (ref 12.0–15.0)
MCH: 28.9 pg (ref 26.0–34.0)
MCHC: 31.3 g/dL (ref 30.0–36.0)
MCV: 92.2 fL (ref 80.0–100.0)
Platelets: 226 10*3/uL (ref 150–400)
RBC: 4.09 MIL/uL (ref 3.87–5.11)
RDW: 13.8 % (ref 11.5–15.5)
WBC: 15.5 10*3/uL — ABNORMAL HIGH (ref 4.0–10.5)
nRBC: 0 % (ref 0.0–0.2)

## 2018-10-28 LAB — BASIC METABOLIC PANEL
Anion gap: 13 (ref 5–15)
BUN: 9 mg/dL (ref 6–20)
CO2: 19 mmol/L — ABNORMAL LOW (ref 22–32)
Calcium: 8.2 mg/dL — ABNORMAL LOW (ref 8.9–10.3)
Chloride: 103 mmol/L (ref 98–111)
Creatinine, Ser: 0.75 mg/dL (ref 0.44–1.00)
GFR calc Af Amer: >60 mL/min (ref >60)
GFR calc non Af Amer: >60 mL/min (ref >60)
Glucose, Bld: 78 mg/dL (ref 70–99)
Potassium: 3.5 mmol/L (ref 3.5–5.1)
Sodium: 135 mmol/L (ref 135–145)

## 2018-10-28 NOTE — Progress Notes (Signed)
Subjective: CC: Abdominal Pain Patient reports that her abdominal pain had greatly improved. She notes that her pain is a 3/10 when she is sitting and 5/10 at it's worst (this is usually when trying to get out of bed etc). She notes that she was able to walk around yesterday with much less pain. No nausea since yesterday morning. Did have some dry heaves yesterday morning as well but those have resolved. Notes she has begun passing flatus and had 3 episodes of loose, watery stool yesterday/this morning.   Objective: Vital signs in last 24 hours: Temp:  [98.2 F (36.8 C)] 98.2 F (36.8 C) (08/13 0608) Pulse Rate:  [57-63] 57 (08/13 0608) Resp:  [18-20] 18 (08/13 0608) BP: (107-123)/(55-67) 117/55 (08/13 0608) SpO2:  [90 %-96 %] 92 % (08/13 0608) Last BM Date: 10/27/18  Intake/Output from previous day: 08/12 0701 - 08/13 0700 In: 1687.6 [I.V.:1620.9; IV Piggyback:66.7] Out: 500 [Urine:500] Intake/Output this shift: No intake/output data recorded.  PE: Gen:  Alert, NAD, pleasant Pulm: Normal rate and effort  Abd: Soft, ND, moderately tender of the lower abdomen without rigidity or peritonitis. She reports this is less tender than yesterday. +BS Ext:  No edema Psych: A&Ox3  Skin: no rashes noted, warm and dry  Lab Results:  Recent Labs    10/27/18 0323 10/28/18 0326  WBC 23.5* 15.5*  HGB 11.3* 11.8*  HCT 35.9* 37.7  PLT 198 226   BMET Recent Labs    10/27/18 0323 10/28/18 0326  NA 135 135  K 3.6 3.5  CL 102 103  CO2 24 19*  GLUCOSE 88 78  BUN 10 9  CREATININE 0.73 0.75  CALCIUM 8.2* 8.2*   PT/INR No results for input(s): LABPROT, INR in the last 72 hours. CMP     Component Value Date/Time   NA 135 10/28/2018 0326   K 3.5 10/28/2018 0326   CL 103 10/28/2018 0326   CO2 19 (L) 10/28/2018 0326   GLUCOSE 78 10/28/2018 0326   BUN 9 10/28/2018 0326   CREATININE 0.75 10/28/2018 0326   CREATININE 0.80 06/12/2015 1048   CALCIUM 8.2 (L) 10/28/2018 0326   PROT 8.1 10/25/2018 0625   ALBUMIN 4.0 10/25/2018 0625   AST 18 10/25/2018 0625   ALT 22 10/25/2018 0625   ALKPHOS 66 10/25/2018 0625   BILITOT 1.0 10/25/2018 0625   GFRNONAA >60 10/28/2018 0326   GFRAA >60 10/28/2018 0326   Lipase     Component Value Date/Time   LIPASE 28 10/25/2018 0625       Studies/Results: No results found.  Anti-infectives: Anti-infectives (From admission, onward)   Start     Dose/Rate Route Frequency Ordered Stop   10/26/18 1400  piperacillin-tazobactam (ZOSYN) IVPB 3.375 g     3.375 g 12.5 mL/hr over 240 Minutes Intravenous Every 8 hours 10/26/18 1252     10/26/18 0200  ciprofloxacin (CIPRO) IVPB 400 mg  Status:  Discontinued     400 mg 200 mL/hr over 60 Minutes Intravenous Every 12 hours 10/25/18 1422 10/26/18 1252   10/25/18 2000  metroNIDAZOLE (FLAGYL) IVPB 500 mg  Status:  Discontinued     500 mg 100 mL/hr over 60 Minutes Intravenous Every 8 hours 10/25/18 1423 10/26/18 1252   10/25/18 1415  ciprofloxacin (CIPRO) IVPB 400 mg  Status:  Discontinued     400 mg 200 mL/hr over 60 Minutes Intravenous Every 12 hours 10/25/18 1403 10/25/18 1422   10/25/18 1415  metroNIDAZOLE (FLAGYL) IVPB 500  mg  Status:  Discontinued     500 mg 100 mL/hr over 60 Minutes Intravenous Every 8 hours 10/25/18 1403 10/25/18 1423   10/25/18 1200  ciprofloxacin (CIPRO) IVPB 400 mg     400 mg 200 mL/hr over 60 Minutes Intravenous  Once 10/25/18 1158 10/25/18 1516   10/25/18 1200  metroNIDAZOLE (FLAGYL) IVPB 500 mg     500 mg 100 mL/hr over 60 Minutes Intravenous  Once 10/25/18 1158 10/25/18 1403       Assessment/Plan Diverticulitis with microperforation - WBC down to 15.5 - Less tender, having bowel function - No indication for emergent or urgent surgery today. We discussed her diagnosis and the possibility of surgery if she worsens and that this would require a colostomy  - Will hold off on repeat CT - Continue conservative management. Can have sips of clears from  the floor - Continue IV abx   FEN - Sips of clears VTE - SCDs, Heparin ID - Zosyn    LOS: 3 days    Jillyn Ledger , [facility] 10/28/2018, 8:21 AM Pager: [phone]

## 2018-10-28 NOTE — Progress Notes (Signed)
[facility]                                                                              Patient Demographics  Sydney Alvarado, is a 53 y.o. female, DOB - [birth year], YQM:[id number]  Admit date - 10/25/2018   Admitting Physician Delano Metz, MD  Outpatient Primary MD for the patient is Patient, No Pcp Per  Outpatient specialists:   LOS - 3  days   Medical records reviewed and are as summarized below:    Chief Complaint  Patient presents with   abdominal pain       Brief summary   53 year old woman no significant PMH presented with 24 hours of severe, excruciating lower abdominal pain with associated nausea and vomiting.  Initial CT was read as negative but upon reread showed diverticulitis in the distal sigmoid area with microperforation.  Admitted for IV antibiotics.   Assessment & Plan    Acute sigmoid diverticulitis with microperforation -Presented with nausea vomiting and severe lower abdominal pain -Per patient had prior history of diverticulitis and colonoscopy in August 2019 -CT abdomen and pelvis showed focal apparent loculated fluid tracking from the right adnexa to the level of cecum, appendix normal, focal area of decreased attenuation in the left lobe of the liver measuring 3.2x 2.7 cm. Acute diverticulitis and microperforation -General surgery following, continue pain control, IV Zosyn, fluids -Overnight had diarrhea, per patient since then she started feeling better, still has lower abdominal pain, leukocytosis improving, 15.5 today -Started on clears and advance to full liquid diet later today  Recent knee surgery, left -Per patient she had knee surgery on 7/29.  -PT evaluation, ambulatory   Code Status: Full CODE STATUS DVT Prophylaxis: Heparin subcu Family Communication: Discussed in detail with the patient, all imaging results, lab results explained to the patient   Disposition Plan: Hopefully DC home tomorrow or Saturday if  continues to improve and tolerating full liquids or soft solids  Time Spent in minutes   25 minutes  Procedures:  CT abdomen pelvis  Consultants:   General surgery  Antimicrobials:   Anti-infectives (From admission, onward)   Start     Dose/Rate Route Frequency Ordered Stop   10/26/18 1400  piperacillin-tazobactam (ZOSYN) IVPB 3.375 g     3.375 g 12.5 mL/hr over 240 Minutes Intravenous Every 8 hours 10/26/18 1252     10/26/18 0200  ciprofloxacin (CIPRO) IVPB 400 mg  Status:  Discontinued     400 mg 200 mL/hr over 60 Minutes Intravenous Every 12 hours 10/25/18 1422 10/26/18 1252   10/25/18 2000  metroNIDAZOLE (FLAGYL) IVPB 500 mg  Status:  Discontinued     500 mg 100 mL/hr over 60 Minutes Intravenous Every 8 hours 10/25/18 1423 10/26/18 1252   10/25/18 1415  ciprofloxacin (CIPRO) IVPB 400 mg  Status:  Discontinued     400 mg 200 mL/hr over 60 Minutes Intravenous Every 12 hours 10/25/18 1403 10/25/18 1422   10/25/18 1415  metroNIDAZOLE (FLAGYL) IVPB 500 mg  Status:  Discontinued     500 mg 100 mL/hr over 60 Minutes Intravenous Every 8 hours 10/25/18 1403 10/25/18 1423   10/25/18  1200  ciprofloxacin (CIPRO) IVPB 400 mg     400 mg 200 mL/hr over 60 Minutes Intravenous  Once 10/25/18 1158 10/25/18 1516   10/25/18 1200  metroNIDAZOLE (FLAGYL) IVPB 500 mg     500 mg 100 mL/hr over 60 Minutes Intravenous  Once 10/25/18 1158 10/25/18 1403         Medications  Scheduled Meds:  heparin injection (subcutaneous)  5,000 Units Subcutaneous Q8H   Continuous Infusions:  sodium chloride 10 mL/hr at 10/28/18 1454   piperacillin-tazobactam (ZOSYN)  IV 3.375 g (10/28/18 1456)   PRN Meds:.acetaminophen **OR** acetaminophen, alum & mag hydroxide-simeth, HYDROmorphone (DILAUDID) injection, ondansetron **OR** ondansetron (ZOFRAN) IV, promethazine      Subjective:   Erick Colace was seen and examined today.  Feeling better this morning, pain in the lower abdomen improving, no  vomiting.  Overnight had diarrhea, states after that she started feeling better.   Patient denies dizziness, chest pain, shortness of breath. No acute events overnight.    Objective:   Vitals:   10/27/18 1330 10/27/18 2057 10/28/18 0608 10/28/18 1350  BP: 107/67 (!) 123/59 (!) 117/55 128/70  Pulse: (!) 58 63 (!) 57 (!) 55  Resp: 20 18 18 18   Temp: 98.2 F (36.8 C) 98.2 F (36.8 C) 98.2 F (36.8 C) 98.3 F (36.8 C)  TempSrc: Oral Oral Oral Oral  SpO2: 90% 96% 92% 98%  Weight:      Height:        Intake/Output Summary (Last 24 hours) at 10/28/2018 1547 Last data filed at 10/28/2018 1400 Gross per 24 hour  Intake 797.85 ml  Output 0 ml  Net 797.85 ml     Wt Readings from Last 3 Encounters:  10/25/18 90.7 kg  06/01/17 89.3 kg  10/09/15 88 kg   Physical Exam  General: Alert and oriented x 3, NAD  Eyes: PERRLA, EOMI, Anicteric Sclera,  HEENT:  Atraumatic, normocephalic  Cardiovascular: S1 S2 clear, no murmurs, RRR. No pedal edema b/l  Respiratory: CTAB, no wheezing, rales or rhonchi  Gastrointestinal: Soft, lower abdomen TTP, nondistended, NBS  Ext: no pedal edema bilaterally  Neuro: no new deficits  Musculoskeletal: No cyanosis, clubbing  Skin: No rashes  Psych: Normal affect and demeanor, alert and oriented x3    Data Reviewed:  I have personally reviewed following labs and imaging studies  Micro Results Recent Results (from the past 240 hour(s))  SARS CORONAVIRUS 2 Nasal Swab Aptima Multi Swab     Status: None   Collection Time: 10/25/18 12:05 PM   Specimen: Aptima Multi Swab; Nasal Swab  Result Value Ref Range Status   SARS Coronavirus 2 NEGATIVE NEGATIVE Final    Comment: (NOTE) SARS-CoV-2 target nucleic acids are NOT DETECTED. The SARS-CoV-2 RNA is generally detectable in upper and lower respiratory specimens during the acute phase of infection. Negative results do not preclude SARS-CoV-2 infection, do not rule out co-infections with other  pathogens, and should not be used as the sole basis for treatment or other patient management decisions. Negative results must be combined with clinical observations, patient history, and epidemiological information. The expected result is Negative. Fact Sheet for Patients: HairSlick.no Fact Sheet for Healthcare Providers: quierodirigir.com This test is not yet approved or cleared by the [place] FDA and  has been authorized for detection and/or diagnosis of SARS-CoV-2 by FDA under an Emergency Use Authorization (EUA). This EUA will remain  in effect (meaning this test can be used) for the duration of the COVID-19 declaration under  Section 56 4(b)(1) of the Act, 21 U.S.C. section 360bbb-3(b)(1), unless the authorization is terminated or revoked sooner. Performed at [facility], [address] [place], [place] [address]     Radiology Reports US Pelvis Transvaginal Non-ob (tv Only)  Result Date: 10/25/2018 CLINICAL DATA:  Pelvic pain for 1-2 days. Followup ruptured hemorrhagic cyst on CT. LMP 10/01/2018. EXAM: TRANSABDOMINAL AND TRANSVAGINAL ULTRASOUND OF PELVIS DOPPLER ULTRASOUND OF OVARIES TECHNIQUE: Both transabdominal and transvaginal ultrasound examinations of the pelvis were performed. Transabdominal technique was performed for global imaging of the pelvis including uterus, ovaries, adnexal regions, and pelvic cul-de-sac. It was necessary to proceed with endovaginal exam following the transabdominal exam to visualize the endometrium and ovaries to better advantage. Color and duplex Doppler ultrasound was utilized to evaluate blood flow to the ovaries. COMPARISON:  Abdominopelvic CT same date. FINDINGS: Uterus Measurements: 9.5 x 4.7 x 5.5 cm = volume: 129 mL. No fibroids or other mass visualized. Endometrium Thickness: 11 mm.  No focal abnormality visualized. Right ovary Measurements: 3.6 x 1.8 x 2.0 cm = volume: 6.9 mL.  Normal appearance/no adnexal mass. Left ovary Measurements: 2.7 x 1.5 x 2.4 cm = volume: 5.1 mL. Normal appearance/no adnexal mass. Pulsed Doppler evaluation of both ovaries demonstrates normal low-resistance arterial and venous waveforms. Other findings There is a small amount of free pelvic fluid, within physiologic limits. IMPRESSION: 1. Normal pelvic ultrasound. The uterus and both ovaries appear normal. 2. To my review of the prior CT, there is soft tissue stranding in the pelvis surrounding the sigmoid colon. There are tiny extraluminal air bubbles, and these findings are consistent with acute diverticulitis. No evidence of abscess or bowel obstruction. Electronically Signed   By: Carey Bullocks M.D.   On: 10/25/2018 11:44   Ct Abdomen Pelvis W Contrast  Addendum Date: 10/25/2018   ADDENDUM REPORT: 10/25/2018 11:50 ADDENDUM: Original report read by Dr. Margarita Grizzle. Addendum by Dr. Purcell Mouton. This case was reviewed at the time of interpretation of subsequent pelvic ultrasound. There is soft tissue stranding in the pelvic fat surrounding the sigmoid colon which demonstrates multiple diverticula. There are tiny extraluminal air bubbles consistent with acute diverticulitis and microperforation. No abscess or bowel obstruction identified. The appendix appears normal. These results were called by telephone at the time of interpretation on 10/25/2018 at 11:49 am to Dr. Theotis Barrio, who verbally acknowledged these results. Electronically Signed   By: Carey Bullocks M.D.   On: 10/25/2018 11:50   Result Date: 10/25/2018 CLINICAL DATA:  Generalized abdominal pain EXAM: CT ABDOMEN AND PELVIS WITH CONTRAST TECHNIQUE: Multidetector CT imaging of the abdomen and pelvis was performed using the standard protocol following bolus administration of intravenous contrast. Oral contrast w was also administered. CONTRAST:  OMNIPAQUE IOHEXOL 300 MG/ML  SOLN COMPARISON:  None. FINDINGS: Lower chest: There is bibasilar  atelectatic change. No lung base edema or consolidation is evident. Hepatobiliary: There is an area of decreased attenuation in the left lobe of the liver measuring 3.2 x 2.7 cm. There is underlying hepatic steatosis. There is a probable cyst in the gallbladder fossa measuring 1.0 x 0.8 cm. No other focal liver lesions are identified. Gallbladder wall is not appreciably thickened. There is no biliary duct dilatation. Pancreas: There is no appreciable pancreatic mass or inflammatory focus. Spleen: No splenic lesions are evident. Adrenals/Urinary Tract: Adrenals bilaterally appear unremarkable. Kidneys bilaterally show no evident mass or hydronephrosis on either side. There is no renal or ureteral calculus on either side. Bladder wall thickness is  within normal limits. Stomach/Bowel: There are w scattered sigmoid diverticula without diverticulitis. There is no appreciable bowel wall or mesenteric thickening. There is moderate stool in the colon. The terminal ileum appears unremarkable. No bowel obstruction evident. No free air or portal venous air. Vascular/Lymphatic: There are foci of aortic atherosclerosis. No aneurysm evident. No evident adenopathy in the abdomen or pelvis. Reproductive: The uterus is anteverted. There is an apparent recent right ovarian cyst rupture with partial collapse. Remaining cyst measures 1.7 x 1.5 cm. There is fluid tracking from the right adnexa into the level of the cecum. Other: Appendix appears normal. No abscess is evident in the abdomen pelvis. No ascites is evident. There is a minimal umbilical hernia containing only fat. Musculoskeletal: There are no blastic or lytic bone lesions. No intramuscular lesions are evident IMPRESSION: 1. There is focal apparent loculated fluid tracking from the right adnexa to the level of the cecum. Nearby appendix does not appear involved and appears normal. Suspect recent right adnexal region cyst rupture with hemorrhagic fluid. No other pelvic mass.  2. Focal area of decreased attenuation in the left lobe of the liver measuring 3.2 x 2.7 cm. Etiology for this lesion uncertain. It may be prudent to consider liver MRI pre and serial post-contrast to further evaluate. If there is a contraindication MRI, CT pre and serial post-contrast would be the alternative imaging study of choice to further evaluate. 3.  Underlying hepatic steatosis. 4. Sigmoid diverticulosis. No bowel obstruction. No abscess in the abdomen or pelvis. Electronically Signed: By: Bretta Bang III M.D. On: 10/25/2018 08:09   US Pelvic Doppler (torsion R/o Or Mass Arterial Flow)  Result Date: 10/25/2018 CLINICAL DATA:  Pelvic pain for 1-2 days. Followup ruptured hemorrhagic cyst on CT. LMP 10/01/2018. EXAM: TRANSABDOMINAL AND TRANSVAGINAL ULTRASOUND OF PELVIS DOPPLER ULTRASOUND OF OVARIES TECHNIQUE: Both transabdominal and transvaginal ultrasound examinations of the pelvis were performed. Transabdominal technique was performed for global imaging of the pelvis including uterus, ovaries, adnexal regions, and pelvic cul-de-sac. It was necessary to proceed with endovaginal exam following the transabdominal exam to visualize the endometrium and ovaries to better advantage. Color and duplex Doppler ultrasound was utilized to evaluate blood flow to the ovaries. COMPARISON:  Abdominopelvic CT same date. FINDINGS: Uterus Measurements: 9.5 x 4.7 x 5.5 cm = volume: 129 mL. No fibroids or other mass visualized. Endometrium Thickness: 11 mm.  No focal abnormality visualized. Right ovary Measurements: 3.6 x 1.8 x 2.0 cm = volume: 6.9 mL. Normal appearance/no adnexal mass. Left ovary Measurements: 2.7 x 1.5 x 2.4 cm = volume: 5.1 mL. Normal appearance/no adnexal mass. Pulsed Doppler evaluation of both ovaries demonstrates normal low-resistance arterial and venous waveforms. Other findings There is a small amount of free pelvic fluid, within physiologic limits. IMPRESSION: 1. Normal pelvic ultrasound. The  uterus and both ovaries appear normal. 2. To my review of the prior CT, there is soft tissue stranding in the pelvis surrounding the sigmoid colon. There are tiny extraluminal air bubbles, and these findings are consistent with acute diverticulitis. No evidence of abscess or bowel obstruction. Electronically Signed   By: Carey Bullocks M.D.   On: 10/25/2018 11:44    Lab Data:  CBC: Recent Labs  Lab 10/25/18 0625 10/26/18 0315 10/27/18 0323 10/28/18 0326  WBC 27.8* 25.2* 23.5* 15.5*  NEUTROABS 24.6*  --   --   --   HGB 13.8 11.1* 11.3* 11.8*  HCT 43.4 35.4* 35.9* 37.7  MCV 90.4 92.7 91.8 92.2  PLT 232 186  198 226   Basic Metabolic Panel: Recent Labs  Lab 10/25/18 0625 10/26/18 0315 10/27/18 0323 10/28/18 0326  NA 137 131* 135 135  K 4.6 3.9 3.6 3.5  CL 101 101 102 103  CO2 22 23 24  19*  GLUCOSE 129* 120* 88 78  BUN 9 9 10 9   CREATININE 0.89 0.77 0.73 0.75  CALCIUM 9.7 8.2* 8.2* 8.2*   GFR: Estimated Creatinine Clearance: 90.8 mL/min (by C-G formula based on SCr of 0.75 mg/dL). Liver Function Tests: Recent Labs  Lab 10/25/18 0625  AST 18  ALT 22  ALKPHOS 66  BILITOT 1.0  PROT 8.1  ALBUMIN 4.0   Recent Labs  Lab 10/25/18 0625  LIPASE 28   No results for input(s): AMMONIA in the last 168 hours. Coagulation Profile: No results for input(s): INR, PROTIME in the last 168 hours. Cardiac Enzymes: No results for input(s): CKTOTAL, CKMB, CKMBINDEX, TROPONINI in the last 168 hours. BNP (last 3 results) No results for input(s): PROBNP in the last 8760 hours. HbA1C: No results for input(s): HGBA1C in the last 72 hours. CBG: No results for input(s): GLUCAP in the last 168 hours. Lipid Profile: No results for input(s): CHOL, HDL, LDLCALC, TRIG, CHOLHDL, LDLDIRECT in the last 72 hours. Thyroid Function Tests: No results for input(s): TSH, T4TOTAL, FREET4, T3FREE, THYROIDAB in the last 72 hours. Anemia Panel: No results for input(s): VITAMINB12, FOLATE, FERRITIN,  TIBC, IRON, RETICCTPCT in the last 72 hours. Urine analysis:    Component Value Date/Time   COLORURINE YELLOW 10/25/2018 0833   APPEARANCEUR CLEAR 10/25/2018 0833   LABSPEC <1.005 (L) 10/25/2018 0833   PHURINE 6.5 10/25/2018 0833   GLUCOSEU NEGATIVE 10/25/2018 0833   HGBUR NEGATIVE 10/25/2018 0833   BILIRUBINUR NEGATIVE 10/25/2018 0833   BILIRUBINUR negative 06/12/2015 1123   KETONESUR NEGATIVE 10/25/2018 0833   PROTEINUR NEGATIVE 10/25/2018 0833   UROBILINOGEN 0.2 06/12/2015 1123   NITRITE NEGATIVE 10/25/2018 0833   LEUKOCYTESUR NEGATIVE 10/25/2018 1610     Madlynn Lundeen M.D. [facility] 10/28/2018, 3:47 PM  Pager: [phone] Between 7am to 7pm - call Pager - [phone]  After 7pm go to [email or web address] - password [email or web address]  Call night coverage person covering after 7pm

## 2018-10-28 NOTE — Progress Notes (Signed)
PT Cancellation Note  Patient Details Name: Sydney Alvarado MRN: [id number] DOB: Nov 18, 1965   Cancelled Treatment:    Reason Eval/Treat Not Completed: PT screened, no needs identified, will sign off (pt reports she is independent with mobility (she's been walking in her room) and with her L knee exercises. No acute PT indicated, will sign off.)   Philomena Doheny PT 10/28/2018  Acute Rehabilitation Services Pager [phone] Office [phone]

## 2018-10-29 DIAGNOSIS — R1031 Right lower quadrant pain: Secondary | ICD-10-CM

## 2018-10-29 LAB — BASIC METABOLIC PANEL
Anion gap: 11 (ref 5–15)
BUN: 7 mg/dL (ref 6–20)
CO2: 22 mmol/L (ref 22–32)
Calcium: 8.4 mg/dL — ABNORMAL LOW (ref 8.9–10.3)
Chloride: 104 mmol/L (ref 98–111)
Creatinine, Ser: 0.77 mg/dL (ref 0.44–1.00)
GFR calc Af Amer: >60 mL/min (ref >60)
GFR calc non Af Amer: >60 mL/min (ref >60)
Glucose, Bld: 109 mg/dL — ABNORMAL HIGH (ref 70–99)
Potassium: 3.5 mmol/L (ref 3.5–5.1)
Sodium: 137 mmol/L (ref 135–145)

## 2018-10-29 LAB — CBC
HCT: 35.3 % — ABNORMAL LOW (ref 36.0–46.0)
Hemoglobin: 11.4 g/dL — ABNORMAL LOW (ref 12.0–15.0)
MCH: 29.5 pg (ref 26.0–34.0)
MCHC: 32.3 g/dL (ref 30.0–36.0)
MCV: 91.2 fL (ref 80.0–100.0)
Platelets: 248 10*3/uL (ref 150–400)
RBC: 3.87 MIL/uL (ref 3.87–5.11)
RDW: 13.8 % (ref 11.5–15.5)
WBC: 11.9 10*3/uL — ABNORMAL HIGH (ref 4.0–10.5)
nRBC: 0 % (ref 0.0–0.2)

## 2018-10-29 NOTE — Progress Notes (Addendum)
Subjective: CC: Diarrhea Patient reports that her lower abdominal pain has improved overall. She has now begun to have crampy abdominal pain in her lower abdomen before she has episodes of diarrhea that she describes as watery/grainy. No bloody stools. She notes ~20 episodes yesterday. She did tolerate FLD yesterday without N/V. Mobilized in halls.   Objective: Vital signs in last 24 hours: Temp:  [97.5 F (36.4 C)-98.3 F (36.8 C)] 98.3 F (36.8 C) (08/14 0547) Pulse Rate:  [52-62] 52 (08/14 0547) Resp:  [18] 18 (08/14 0547) BP: (112-128)/(64-72) 112/64 (08/14 0547) SpO2:  [93 %-98 %] 93 % (08/14 0547) Last BM Date: 10/28/18  Intake/Output from previous day: 08/13 0701 - 08/14 0700 In: 1857.5 [P.O.:610; I.V.:1041.8; IV Piggyback:205.7] Out: 0  Intake/Output this shift: No intake/output data recorded.  PE: Gen:  Alert, NAD, pleasant Heart: RRR Pulm: CTA b/l, normal rate and effort Abd: Soft, ND, mildly tender of the lower abdomen without rigidity or peritonitis. Sheis less tender than yesterday. +BS Ext:  No edema Psych: A&Ox3  Skin: no rashes noted, warm and dry  Lab Results:  Recent Labs    10/28/18 0326 10/29/18 0325  WBC 15.5* 11.9*  HGB 11.8* 11.4*  HCT 37.7 35.3*  PLT 226 248   BMET Recent Labs    10/28/18 0326 10/29/18 0325  NA 135 137  K 3.5 3.5  CL 103 104  CO2 19* 22  GLUCOSE 78 109*  BUN 9 7  CREATININE 0.75 0.77  CALCIUM 8.2* 8.4*   PT/INR No results for input(s): LABPROT, INR in the last 72 hours. CMP     Component Value Date/Time   NA 137 10/29/2018 0325   K 3.5 10/29/2018 0325   CL 104 10/29/2018 0325   CO2 22 10/29/2018 0325   GLUCOSE 109 (H) 10/29/2018 0325   BUN 7 10/29/2018 0325   CREATININE 0.77 10/29/2018 0325   CREATININE 0.80 06/12/2015 1048   CALCIUM 8.4 (L) 10/29/2018 0325   PROT 8.1 10/25/2018 0625   ALBUMIN 4.0 10/25/2018 0625   AST 18 10/25/2018 0625   ALT 22 10/25/2018 0625   ALKPHOS 66 10/25/2018 0625   BILITOT 1.0 10/25/2018 0625   GFRNONAA >60 10/29/2018 0325   GFRAA >60 10/29/2018 0325   Lipase     Component Value Date/Time   LIPASE 28 10/25/2018 0625       Studies/Results: No results found.  Anti-infectives: Anti-infectives (From admission, onward)   Start     Dose/Rate Route Frequency Ordered Stop   10/26/18 1400  piperacillin-tazobactam (ZOSYN) IVPB 3.375 g     3.375 g 12.5 mL/hr over 240 Minutes Intravenous Every 8 hours 10/26/18 1252     10/26/18 0200  ciprofloxacin (CIPRO) IVPB 400 mg  Status:  Discontinued     400 mg 200 mL/hr over 60 Minutes Intravenous Every 12 hours 10/25/18 1422 10/26/18 1252   10/25/18 2000  metroNIDAZOLE (FLAGYL) IVPB 500 mg  Status:  Discontinued     500 mg 100 mL/hr over 60 Minutes Intravenous Every 8 hours 10/25/18 1423 10/26/18 1252   10/25/18 1415  ciprofloxacin (CIPRO) IVPB 400 mg  Status:  Discontinued     400 mg 200 mL/hr over 60 Minutes Intravenous Every 12 hours 10/25/18 1403 10/25/18 1422   10/25/18 1415  metroNIDAZOLE (FLAGYL) IVPB 500 mg  Status:  Discontinued     500 mg 100 mL/hr over 60 Minutes Intravenous Every 8 hours 10/25/18 1403 10/25/18 1423   10/25/18 1200  ciprofloxacin (  CIPRO) IVPB 400 mg     400 mg 200 mL/hr over 60 Minutes Intravenous  Once 10/25/18 1158 10/25/18 1516   10/25/18 1200  metroNIDAZOLE (FLAGYL) IVPB 500 mg     500 mg 100 mL/hr over 60 Minutes Intravenous  Once 10/25/18 1158 10/25/18 1403       Assessment/Plan Diverticulitis with microperforation - WBC down to 11.9 - Less tender, having bowel function - C. Diff for diarrhea  - Advance diet  - Continue IV abx  - No indication for emergent or urgent surgery today. Continue conservative management. We discussed her diagnosis and the possibility of surgery if she turns the corner and worsens and that this would require a colostomy  - She follows with Dr. Collene Mares and had a colonoscopy in 2019  FEN - Soft VTE - SCDs, Heparin ID - Zosyn 8/11 >> (day  4)   LOS: 4 days    Jillyn Ledger , [facility] 10/29/2018, 8:31 AM Pager: [phone]

## 2018-10-29 NOTE — Progress Notes (Signed)
[facility]                                                                              Patient Demographics  Sydney Alvarado, is a 53 y.o. female, DOB - 20-Aug-1965, WUJ:[id number]  Admit date - 10/25/2018   Admitting Physician Delano Metz, MD  Outpatient Primary MD for the patient is Patient, No Pcp Per  Outpatient specialists:   LOS - 4  days   Medical records reviewed and are as summarized below:    Chief Complaint  Patient presents with   abdominal pain       Brief summary   53 year old woman no significant PMH presented with 24 hours of severe, excruciating lower abdominal pain with associated nausea and vomiting.  Initial CT was read as negative but upon reread showed diverticulitis in the distal sigmoid area with microperforation.  Admitted for IV antibiotics.   Assessment & Plan    Acute sigmoid diverticulitis with microperforation -Presented with nausea vomiting and severe lower abdominal pain -Per patient had prior history of diverticulitis and colonoscopy in August 2019 -CT abdomen and pelvis showed focal apparent loculated fluid tracking from the right adnexa to the level of cecum, appendix normal, focal area of decreased attenuation in the left lobe of the liver measuring 3.2x 2.7 cm. Acute diverticulitis and microperforation -General surgery following.  Patient overall improving, leukocytosis improving, no fevers or chills.  Still has lower abdomen pain, diarrhea.  C. difficile pending -Placed on soft diet today.  Continue IV fluids, pain control, IV Zosyn Will transition to oral Augmentin at the time of discharge.  Recent knee surgery, left -Per patient she had knee surgery on 7/29.  -PT evaluation, ambulatory   Code Status: Full CODE STATUS DVT Prophylaxis: Heparin subcu Family Communication: Discussed in detail with the patient, all imaging results, lab results explained to the patient   Disposition Plan: Hopefully DC home tomorrow  if diarrhea improves and tolerating solid diet.    Time Spent in minutes   25 minutes  Procedures:  CT abdomen pelvis  Consultants:   General surgery  Antimicrobials:   Anti-infectives (From admission, onward)   Start     Dose/Rate Route Frequency Ordered Stop   10/26/18 1400  piperacillin-tazobactam (ZOSYN) IVPB 3.375 g     3.375 g 12.5 mL/hr over 240 Minutes Intravenous Every 8 hours 10/26/18 1252     10/26/18 0200  ciprofloxacin (CIPRO) IVPB 400 mg  Status:  Discontinued     400 mg 200 mL/hr over 60 Minutes Intravenous Every 12 hours 10/25/18 1422 10/26/18 1252   10/25/18 2000  metroNIDAZOLE (FLAGYL) IVPB 500 mg  Status:  Discontinued     500 mg 100 mL/hr over 60 Minutes Intravenous Every 8 hours 10/25/18 1423 10/26/18 1252   10/25/18 1415  ciprofloxacin (CIPRO) IVPB 400 mg  Status:  Discontinued     400 mg 200 mL/hr over 60 Minutes Intravenous Every 12 hours 10/25/18 1403 10/25/18 1422   10/25/18 1415  metroNIDAZOLE (FLAGYL) IVPB 500 mg  Status:  Discontinued     500 mg 100 mL/hr over 60 Minutes Intravenous Every 8 hours 10/25/18 1403 10/25/18 1423  10/25/18 1200  ciprofloxacin (CIPRO) IVPB 400 mg     400 mg 200 mL/hr over 60 Minutes Intravenous  Once 10/25/18 1158 10/25/18 1516   10/25/18 1200  metroNIDAZOLE (FLAGYL) IVPB 500 mg     500 mg 100 mL/hr over 60 Minutes Intravenous  Once 10/25/18 1158 10/25/18 1403         Medications  Scheduled Meds:  heparin injection (subcutaneous)  5,000 Units Subcutaneous Q8H   Continuous Infusions:  sodium chloride 10 mL/hr at 10/28/18 2106   piperacillin-tazobactam (ZOSYN)  IV 3.375 g (10/29/18 1301)   PRN Meds:.acetaminophen **OR** acetaminophen, alum & mag hydroxide-simeth, HYDROmorphone (DILAUDID) injection, ondansetron **OR** ondansetron (ZOFRAN) IV, promethazine      Subjective:   Sydney Alvarado was seen and examined today.  Per patient overnight had diarrhea, no fevers or chills.  Lower abdominal pain, 4/10.   Patient denies dizziness, chest pain, shortness of breath. No acute events overnight.    Objective:   Vitals:   10/28/18 1350 10/28/18 2104 10/29/18 0547 10/29/18 1443  BP: 128/70 125/72 112/64 140/85  Pulse: (!) 55 62 (!) 52 65  Resp: 18 18 18 16   Temp: 98.3 F (36.8 C) (!) 97.5 F (36.4 C) 98.3 F (36.8 C) 98 F (36.7 C)  TempSrc: Oral Oral Oral Oral  SpO2: 98% 97% 93% 96%  Weight:      Height:        Intake/Output Summary (Last 24 hours) at 10/29/2018 1724 Last data filed at 10/29/2018 0949 Gross per 24 hour  Intake 1020.09 ml  Output --  Net 1020.09 ml     Wt Readings from Last 3 Encounters:  10/25/18 90.7 kg  06/01/17 89.3 kg  10/09/15 88 kg   Physical Exam  General: Alert and oriented x 3, NAD  Eyes:   HEENT:  Atraumatic, normocephalic  Cardiovascular: S1 S2 clear,  RRR. No pedal edema b/l  Respiratory: CTAB, no wheezing, rales or rhonchi  Gastrointestinal: Soft, lower abdomen TTP, nondistended, NBS  Ext: no pedal edema bilaterally  Neuro: no new deficits  Musculoskeletal: No cyanosis, clubbing  Skin: No rashes  Psych: Normal affect and demeanor, alert and oriented x3    Data Reviewed:  I have personally reviewed following labs and imaging studies  Micro Results Recent Results (from the past 240 hour(s))  SARS CORONAVIRUS 2 Nasal Swab Aptima Multi Swab     Status: None   Collection Time: 10/25/18 12:05 PM   Specimen: Aptima Multi Swab; Nasal Swab  Result Value Ref Range Status   SARS Coronavirus 2 NEGATIVE NEGATIVE Final    Comment: (NOTE) SARS-CoV-2 target nucleic acids are NOT DETECTED. The SARS-CoV-2 RNA is generally detectable in upper and lower respiratory specimens during the acute phase of infection. Negative results do not preclude SARS-CoV-2 infection, do not rule out co-infections with other pathogens, and should not be used as the sole basis for treatment or other patient management decisions. Negative results must be combined  with clinical observations, patient history, and epidemiological information. The expected result is Negative. Fact Sheet for Patients: HairSlick.no Fact Sheet for Healthcare Providers: quierodirigir.com This test is not yet approved or cleared by the [place] FDA and  has been authorized for detection and/or diagnosis of SARS-CoV-2 by FDA under an Emergency Use Authorization (EUA). This EUA will remain  in effect (meaning this test can be used) for the duration of the COVID-19 declaration under Section 56 4(b)(1) of the Act, 21 U.S.C. section 360bbb-3(b)(1), unless the authorization is terminated or  revoked sooner. Performed at [facility], [address] [place], [place] [address]     Radiology Reports US Pelvis Transvaginal Non-ob (tv Only)  Result Date: 10/25/2018 CLINICAL DATA:  Pelvic pain for 1-2 days. Followup ruptured hemorrhagic cyst on CT. LMP 10/01/2018. EXAM: TRANSABDOMINAL AND TRANSVAGINAL ULTRASOUND OF PELVIS DOPPLER ULTRASOUND OF OVARIES TECHNIQUE: Both transabdominal and transvaginal ultrasound examinations of the pelvis were performed. Transabdominal technique was performed for global imaging of the pelvis including uterus, ovaries, adnexal regions, and pelvic cul-de-sac. It was necessary to proceed with endovaginal exam following the transabdominal exam to visualize the endometrium and ovaries to better advantage. Color and duplex Doppler ultrasound was utilized to evaluate blood flow to the ovaries. COMPARISON:  Abdominopelvic CT same date. FINDINGS: Uterus Measurements: 9.5 x 4.7 x 5.5 cm = volume: 129 mL. No fibroids or other mass visualized. Endometrium Thickness: 11 mm.  No focal abnormality visualized. Right ovary Measurements: 3.6 x 1.8 x 2.0 cm = volume: 6.9 mL. Normal appearance/no adnexal mass. Left ovary Measurements: 2.7 x 1.5 x 2.4 cm = volume: 5.1 mL. Normal appearance/no adnexal mass. Pulsed  Doppler evaluation of both ovaries demonstrates normal low-resistance arterial and venous waveforms. Other findings There is a small amount of free pelvic fluid, within physiologic limits. IMPRESSION: 1. Normal pelvic ultrasound. The uterus and both ovaries appear normal. 2. To my review of the prior CT, there is soft tissue stranding in the pelvis surrounding the sigmoid colon. There are tiny extraluminal air bubbles, and these findings are consistent with acute diverticulitis. No evidence of abscess or bowel obstruction. Electronically Signed   By: Carey Bullocks M.D.   On: 10/25/2018 11:44   Ct Abdomen Pelvis W Contrast  Addendum Date: 10/25/2018   ADDENDUM REPORT: 10/25/2018 11:50 ADDENDUM: Original report read by Dr. Margarita Grizzle. Addendum by Dr. Purcell Mouton. This case was reviewed at the time of interpretation of subsequent pelvic ultrasound. There is soft tissue stranding in the pelvic fat surrounding the sigmoid colon which demonstrates multiple diverticula. There are tiny extraluminal air bubbles consistent with acute diverticulitis and microperforation. No abscess or bowel obstruction identified. The appendix appears normal. These results were called by telephone at the time of interpretation on 10/25/2018 at 11:49 am to Dr. Theotis Barrio, who verbally acknowledged these results. Electronically Signed   By: Carey Bullocks M.D.   On: 10/25/2018 11:50   Result Date: 10/25/2018 CLINICAL DATA:  Generalized abdominal pain EXAM: CT ABDOMEN AND PELVIS WITH CONTRAST TECHNIQUE: Multidetector CT imaging of the abdomen and pelvis was performed using the standard protocol following bolus administration of intravenous contrast. Oral contrast w was also administered. CONTRAST:  OMNIPAQUE IOHEXOL 300 MG/ML  SOLN COMPARISON:  None. FINDINGS: Lower chest: There is bibasilar atelectatic change. No lung base edema or consolidation is evident. Hepatobiliary: There is an area of decreased attenuation in the left lobe of  the liver measuring 3.2 x 2.7 cm. There is underlying hepatic steatosis. There is a probable cyst in the gallbladder fossa measuring 1.0 x 0.8 cm. No other focal liver lesions are identified. Gallbladder wall is not appreciably thickened. There is no biliary duct dilatation. Pancreas: There is no appreciable pancreatic mass or inflammatory focus. Spleen: No splenic lesions are evident. Adrenals/Urinary Tract: Adrenals bilaterally appear unremarkable. Kidneys bilaterally show no evident mass or hydronephrosis on either side. There is no renal or ureteral calculus on either side. Bladder wall thickness is within normal limits. Stomach/Bowel: There are w scattered sigmoid diverticula without diverticulitis. There is no appreciable  bowel wall or mesenteric thickening. There is moderate stool in the colon. The terminal ileum appears unremarkable. No bowel obstruction evident. No free air or portal venous air. Vascular/Lymphatic: There are foci of aortic atherosclerosis. No aneurysm evident. No evident adenopathy in the abdomen or pelvis. Reproductive: The uterus is anteverted. There is an apparent recent right ovarian cyst rupture with partial collapse. Remaining cyst measures 1.7 x 1.5 cm. There is fluid tracking from the right adnexa into the level of the cecum. Other: Appendix appears normal. No abscess is evident in the abdomen pelvis. No ascites is evident. There is a minimal umbilical hernia containing only fat. Musculoskeletal: There are no blastic or lytic bone lesions. No intramuscular lesions are evident IMPRESSION: 1. There is focal apparent loculated fluid tracking from the right adnexa to the level of the cecum. Nearby appendix does not appear involved and appears normal. Suspect recent right adnexal region cyst rupture with hemorrhagic fluid. No other pelvic mass. 2. Focal area of decreased attenuation in the left lobe of the liver measuring 3.2 x 2.7 cm. Etiology for this lesion uncertain. It may be  prudent to consider liver MRI pre and serial post-contrast to further evaluate. If there is a contraindication MRI, CT pre and serial post-contrast would be the alternative imaging study of choice to further evaluate. 3.  Underlying hepatic steatosis. 4. Sigmoid diverticulosis. No bowel obstruction. No abscess in the abdomen or pelvis. Electronically Signed: By: Bretta Bang III M.D. On: 10/25/2018 08:09   US Pelvic Doppler (torsion R/o Or Mass Arterial Flow)  Result Date: 10/25/2018 CLINICAL DATA:  Pelvic pain for 1-2 days. Followup ruptured hemorrhagic cyst on CT. LMP 10/01/2018. EXAM: TRANSABDOMINAL AND TRANSVAGINAL ULTRASOUND OF PELVIS DOPPLER ULTRASOUND OF OVARIES TECHNIQUE: Both transabdominal and transvaginal ultrasound examinations of the pelvis were performed. Transabdominal technique was performed for global imaging of the pelvis including uterus, ovaries, adnexal regions, and pelvic cul-de-sac. It was necessary to proceed with endovaginal exam following the transabdominal exam to visualize the endometrium and ovaries to better advantage. Color and duplex Doppler ultrasound was utilized to evaluate blood flow to the ovaries. COMPARISON:  Abdominopelvic CT same date. FINDINGS: Uterus Measurements: 9.5 x 4.7 x 5.5 cm = volume: 129 mL. No fibroids or other mass visualized. Endometrium Thickness: 11 mm.  No focal abnormality visualized. Right ovary Measurements: 3.6 x 1.8 x 2.0 cm = volume: 6.9 mL. Normal appearance/no adnexal mass. Left ovary Measurements: 2.7 x 1.5 x 2.4 cm = volume: 5.1 mL. Normal appearance/no adnexal mass. Pulsed Doppler evaluation of both ovaries demonstrates normal low-resistance arterial and venous waveforms. Other findings There is a small amount of free pelvic fluid, within physiologic limits. IMPRESSION: 1. Normal pelvic ultrasound. The uterus and both ovaries appear normal. 2. To my review of the prior CT, there is soft tissue stranding in the pelvis surrounding the  sigmoid colon. There are tiny extraluminal air bubbles, and these findings are consistent with acute diverticulitis. No evidence of abscess or bowel obstruction. Electronically Signed   By: Carey Bullocks M.D.   On: 10/25/2018 11:44    Lab Data:  CBC: Recent Labs  Lab 10/25/18 0625 10/26/18 0315 10/27/18 0323 10/28/18 0326 10/29/18 0325  WBC 27.8* 25.2* 23.5* 15.5* 11.9*  NEUTROABS 24.6*  --   --   --   --   HGB 13.8 11.1* 11.3* 11.8* 11.4*  HCT 43.4 35.4* 35.9* 37.7 35.3*  MCV 90.4 92.7 91.8 92.2 91.2  PLT 232 186 198 226 248   Basic Metabolic  Panel: Recent Labs  Lab 10/25/18 0625 10/26/18 0315 10/27/18 0323 10/28/18 0326 10/29/18 0325  NA 137 131* 135 135 137  K 4.6 3.9 3.6 3.5 3.5  CL 101 101 102 103 104  CO2 22 23 24  19* 22  GLUCOSE 129* 120* 88 78 109*  BUN 9 9 10 9 7   CREATININE 0.89 0.77 0.73 0.75 0.77  CALCIUM 9.7 8.2* 8.2* 8.2* 8.4*   GFR: Estimated Creatinine Clearance: 90.8 mL/min (by C-G formula based on SCr of 0.77 mg/dL). Liver Function Tests: Recent Labs  Lab 10/25/18 0625  AST 18  ALT 22  ALKPHOS 66  BILITOT 1.0  PROT 8.1  ALBUMIN 4.0   Recent Labs  Lab 10/25/18 0625  LIPASE 28   No results for input(s): AMMONIA in the last 168 hours. Coagulation Profile: No results for input(s): INR, PROTIME in the last 168 hours. Cardiac Enzymes: No results for input(s): CKTOTAL, CKMB, CKMBINDEX, TROPONINI in the last 168 hours. BNP (last 3 results) No results for input(s): PROBNP in the last 8760 hours. HbA1C: No results for input(s): HGBA1C in the last 72 hours. CBG: No results for input(s): GLUCAP in the last 168 hours. Lipid Profile: No results for input(s): CHOL, HDL, LDLCALC, TRIG, CHOLHDL, LDLDIRECT in the last 72 hours. Thyroid Function Tests: No results for input(s): TSH, T4TOTAL, FREET4, T3FREE, THYROIDAB in the last 72 hours. Anemia Panel: No results for input(s): VITAMINB12, FOLATE, FERRITIN, TIBC, IRON, RETICCTPCT in the last 72  hours. Urine analysis:    Component Value Date/Time   COLORURINE YELLOW 10/25/2018 0833   APPEARANCEUR CLEAR 10/25/2018 0833   LABSPEC <1.005 (L) 10/25/2018 0833   PHURINE 6.5 10/25/2018 0833   GLUCOSEU NEGATIVE 10/25/2018 0833   HGBUR NEGATIVE 10/25/2018 0833   BILIRUBINUR NEGATIVE 10/25/2018 0833   BILIRUBINUR negative 06/12/2015 1123   KETONESUR NEGATIVE 10/25/2018 0833   PROTEINUR NEGATIVE 10/25/2018 0833   UROBILINOGEN 0.2 06/12/2015 1123   NITRITE NEGATIVE 10/25/2018 0833   LEUKOCYTESUR NEGATIVE 10/25/2018 9604     Neiva Maenza M.D. [facility] 10/29/2018, 5:24 PM  Pager: [phone] Between 7am to 7pm - call Pager - [phone]  After 7pm go to [email or web address] - password [email or web address]  Call night coverage person covering after 7pm

## 2018-10-29 NOTE — Discharge Instructions (Signed)
Diverticulitis  Diverticulitis is infection or inflammation of small pouches (diverticula) in the colon that form due to a condition called diverticulosis. Diverticula can trap stool (feces) and bacteria, causing infection and inflammation. Diverticulitis may cause severe stomach pain and diarrhea. It may lead to tissue damage in the colon that causes bleeding. The diverticula may also burst (rupture) and cause infected stool to enter other areas of the abdomen. Complications of diverticulitis can include:  Bleeding.  Severe infection.  Severe pain.  Rupture (perforation) of the colon.  Blockage (obstruction) of the colon. What are the causes? This condition is caused by stool becoming trapped in the diverticula, which allows bacteria to grow in the diverticula. This leads to inflammation and infection. What increases the risk? You are more likely to develop this condition if:  You have diverticulosis. The risk for diverticulosis increases if: ? You are overweight or obese. ? You use tobacco products. ? You do not get enough exercise.  You eat a diet that does not include enough fiber. High-fiber foods include fruits, vegetables, beans, nuts, and whole grains. What are the signs or symptoms? Symptoms of this condition may include:  Pain and tenderness in the abdomen. The pain is normally located on the left side of the abdomen, but it may occur in other areas.  Fever and chills.  Bloating.  Cramping.  Nausea.  Vomiting.  Changes in bowel routines.  Blood in your stool. How is this diagnosed? This condition is diagnosed based on:  Your medical history.  A physical exam.  Tests to make sure there is nothing else causing your condition. These tests may include: ? Blood tests. ? Urine tests. ? Imaging tests of the abdomen, including X-rays, ultrasounds, MRIs, or CT scans. How is this treated? Most cases of this condition are mild and can be treated at home.  Treatment may include:  Taking over-the-counter pain medicines.  Following a clear liquid diet.  Taking antibiotic medicines by mouth.  Rest. More severe cases may need to be treated at a hospital. Treatment may include:  Not eating or drinking.  Taking prescription pain medicine.  Receiving antibiotic medicines through an IV tube.  Receiving fluids and nutrition through an IV tube.  Surgery. When your condition is under control, your health care provider may recommend that you have a colonoscopy. This is an exam to look at the entire large intestine. During the exam, a lubricated, bendable tube is inserted into the anus and then passed into the rectum, colon, and other parts of the large intestine. A colonoscopy can show how severe your diverticula are and whether something else may be causing your symptoms. Follow these instructions at home: Medicines  Take over-the-counter and prescription medicines only as told by your health care provider. These include fiber supplements, probiotics, and stool softeners.  If you were prescribed an antibiotic medicine, take it as told by your health care provider. Do not stop taking the antibiotic even if you start to feel better.  Do not drive or use heavy machinery while taking prescription pain medicine. General instructions   Follow a full liquid diet or another diet as directed by your health care provider. After your symptoms improve, your health care provider may tell you to change your diet. He or she may recommend that you eat a diet that contains at least 25 g (25 grams) of fiber daily. Fiber makes it easier to pass stool. Healthy sources of fiber include: ? Berries. One cup contains 4-8 grams of  fiber. ? Beans or lentils. One half cup contains 5-8 grams of fiber. ? Green vegetables. One cup contains 4 grams of fiber.  Exercise for at least 30 minutes, 3 times each week. You should exercise hard enough to raise your heart rate and  break a sweat.  Keep all follow-up visits as told by your health care provider. This is important. You may need a colonoscopy. Contact a health care provider if:  Your pain does not improve.  You have a hard time drinking or eating food.  Your bowel movements do not return to normal. Get help right away if:  Your pain gets worse.  Your symptoms do not get better with treatment.  Your symptoms suddenly get worse.  You have a fever.  You vomit more than one time.  You have stools that are bloody, black, or tarry. Summary  Diverticulitis is infection or inflammation of small pouches (diverticula) in the colon that form due to a condition called diverticulosis. Diverticula can trap stool (feces) and bacteria, causing infection and inflammation.  You are at higher risk for this condition if you have diverticulosis and you eat a diet that does not include enough fiber.  Most cases of this condition are mild and can be treated at home. More severe cases may need to be treated at a hospital.  When your condition is under control, your health care provider may recommend that you have an exam called a colonoscopy. This exam can show how severe your diverticula are and whether something else may be causing your symptoms. This information is not intended to replace advice given to you by your health care provider. Make sure you discuss any questions you have with your health care provider. Document Released: 12/11/2004 Document Revised: 02/13/2017 Document Reviewed: 04/05/2016 Elsevier Patient Education  [place] (Please follow this diet for the next 4 weeks) A soft-food eating plan includes foods that are safe and easy to chew and swallow. Your health care provider or dietitian can help you find foods and flavors that fit into this plan. Follow this plan until your health care provider or dietitian says it is safe to start eating other foods and food  textures. What are tips for following this plan? General guidelines   Take small bites of food, or cut food into pieces about  inch or smaller. Bite-sized pieces of food are easier to chew and swallow.  Eat moist foods. Avoid overly dry foods.  Avoid foods that: ? Are difficult to swallow, such as dry, chunky, crispy, or sticky foods. ? Are difficult to chew, such as hard, tough, or stringy foods. ? Contain nuts, seeds, or fruits.  Follow instructions from your dietitian about the types of liquids that are safe for you to swallow. You may be allowed to have: ? Thick liquids only. This includes only liquids that are thicker than honey. ? Thin and thick liquids. This includes all beverages and foods that become liquid at room temperature.  To make thick liquids: ? Purchase a commercial liquid thickening powder. These are available at grocery stores and pharmacies. ? Mix the thickener into liquids according to instructions on the label. ? Purchase ready-made thickened liquids. ? Thicken soup by pureeing, straining to remove chunks, and adding flour, potato flakes, or corn starch. ? Add commercial thickener to foods that become liquid at room temperature, such as milk shakes, yogurt, ice cream, gelatin, and sherbet.  Ask your health care provider whether you  need to take a fiber supplement. Cooking  Cook meats so they stay tender and moist. Use methods like braising, stewing, or baking in liquid.  Cook vegetables and fruit until they are soft enough to be mashed with a fork.  Peel soft, fresh fruits such as peaches, nectarines, and melons.  When making soup, make sure chunks of meat and vegetables are smaller than  inch.  Reheat leftover foods slowly so that a tough crust does not form. What foods are allowed? The items listed below may not be a complete list. Talk with your dietitian about what dietary choices are best for you. Grains Breads, muffins, pancakes, or waffles  moistened with syrup, jelly, or butter. Dry cereals well-moistened with milk. Moist, cooked cereals. Well-cooked pasta and rice. Vegetables All soft-cooked vegetables. Shredded lettuce. Fruits All canned and cooked fruits. Soft, peeled fresh fruits. Strawberries. Dairy Milk. Cream. Yogurt. Cottage cheese. Soft cheese without the rind. Meats and other protein foods Tender, moist ground meat, poultry, or fish. Meat cooked in gravy or sauces. Eggs. Sweets and desserts Ice cream. Milk shakes. Sherbet. Pudding. Fats and oils Butter. Margarine. Olive, canola, sunflower, and grapeseed oil. Smooth salad dressing. Smooth cream cheese. Mayonnaise. Gravy. What foods are not allowed? The items listed bemay not be a complete list. Talk with your dietitian about what dietary choices are best for you. Grains Coarse or dry cereals, such as bran, granola, and shredded wheat. Tough or chewy crusty breads, such as [place] bread or baguettes. Breads with nuts, seeds, or fruit. Vegetables All raw vegetables. Cooked corn. Cooked vegetables that are tough or stringy. Tough, crisp, fried potatoes and potato skins. Fruits Fresh fruits with skins or seeds, or both, such as apples, pears, and grapes. Stringy, high-pulp fruits, such as papaya, pineapple, coconut, and mango. Fruit leather and all dried fruit. Dairy Yogurt with nuts or coconut. Meats and other protein foods Hard, dry sausages. Dry meat, poultry, or fish. Meats with gristle. Fish with bones. Fried meat or fish. Lunch meat and hotdogs. Nuts and seeds. Chunky peanut butter or other nut butters. Sweets and desserts Cakes or cookies that are very dry or chewy. Desserts with dried fruit, nuts, or coconut. Fried pastries. Very rich pastries. Fats and oils Cream cheese with fruit or nuts. Salad dressings with seeds or chunks. Summary  A soft-food eating plan includes foods that are safe and easy to swallow. Generally, the foods should be soft enough to be  mashed with a fork.  Avoid foods that are dry, hard to chew, crunchy, sticky, stringy, or crispy.  Ask your health care provider whether you need to thicken your liquids and if you need to take a fiber supplement. This information is not intended to replace advice given to you by your health care provider. Make sure you discuss any questions you have with your health care provider. Document Released: 06/10/2007 Document Revised: 06/24/2018 Document Reviewed: 05/06/2016 Elsevier Patient Education  [place].   High-Fiber Diet (please start this diet plan in 4 weeks) Fiber, also called dietary fiber, is a type of carbohydrate that is found in fruits, vegetables, whole grains, and beans. A high-fiber diet can have many health benefits. Your health care provider may recommend a high-fiber diet to help:  Prevent constipation. Fiber can make your bowel movements more regular.  Lower your cholesterol.  Relieve the following conditions: ? Swelling of veins in the anus (hemorrhoids). ? Swelling and irritation (inflammation) of specific areas of the digestive tract (uncomplicated diverticulosis). ? A  problem of the large intestine (colon) that sometimes causes pain and diarrhea (irritable bowel syndrome, IBS).  Prevent overeating as part of a weight-loss plan.  Prevent heart disease, type 2 diabetes, and certain cancers. What is my plan? The recommended daily fiber intake in grams (g) includes:  38 g for men age 59 or younger.  30 g for men over age 56.  15 g for women age 84 or younger.  21 g for women over age 91. You can get the recommended daily intake of dietary fiber by:  Eating a variety of fruits, vegetables, grains, and beans.  Taking a fiber supplement, if it is not possible to get enough fiber through your diet. What do I need to know about a high-fiber diet?  It is better to get fiber through food sources rather than from fiber supplements. There is not a lot of  research about how effective supplements are.  Always check the fiber content on the nutrition facts label of any prepackaged food. Look for foods that contain 5 g of fiber or more per serving.  Talk with a diet and nutrition specialist (dietitian) if you have questions about specific foods that are recommended or not recommended for your medical condition, especially if those foods are not listed below.  Gradually increase how much fiber you consume. If you increase your intake of dietary fiber too quickly, you may have bloating, cramping, or gas.  Drink plenty of water. Water helps you to digest fiber. What are tips for following this plan?  Eat a wide variety of high-fiber foods.  Make sure that half of the grains that you eat each day are whole grains.  Eat breads and cereals that are made with whole-grain flour instead of refined flour or white flour.  Eat brown rice, bulgur wheat, or millet instead of white rice.  Start the day with a breakfast that is high in fiber, such as a cereal that contains 5 g of fiber or more per serving.  Use beans in place of meat in soups, salads, and pasta dishes.  Eat high-fiber snacks, such as berries, raw vegetables, nuts, and popcorn.  Choose whole fruits and vegetables instead of processed forms like juice or sauce. What foods can I eat?  Fruits Berries. Pears. Apples. Oranges. Avocado. Prunes and raisins. Dried figs. Vegetables Sweet potatoes. Spinach. Kale. Artichokes. Cabbage. Broccoli. Cauliflower. Green peas. Carrots. Squash. Grains Whole-grain breads. Multigrain cereal. Oats and oatmeal. Brown rice. Barley. Bulgur wheat. [place]. Quinoa. Bran muffins. Popcorn. Rye wafer crackers. Meats and other proteins [place], kidney, and pinto beans. Soybeans. Split peas. Lentils. Nuts and seeds. Dairy Fiber-fortified yogurt. Beverages Fiber-fortified soy milk. Fiber-fortified orange juice. Other foods Fiber bars. The items listed above may not  be a complete list of recommended foods and beverages. Contact a dietitian for more options. What foods are not recommended? Fruits Fruit juice. Cooked, strained fruit. Vegetables Fried potatoes. Canned vegetables. Well-cooked vegetables. Grains White bread. Pasta made with refined flour. White rice. Meats and other proteins Fatty cuts of meat. Fried chicken or fried fish. Dairy Milk. Yogurt. Cream cheese. Sour cream. Fats and oils Butters. Beverages Soft drinks. Other foods Cakes and pastries. The items listed above may not be a complete list of foods and beverages to avoid. Contact a dietitian for more information. Summary  Fiber is a type of carbohydrate. It is found in fruits, vegetables, whole grains, and beans.  There are many health benefits of eating a high-fiber diet, such as preventing constipation,  lowering blood cholesterol, helping with weight loss, and reducing your risk of heart disease, diabetes, and certain cancers.  Gradually increase your intake of fiber. Increasing too fast can result in cramping, bloating, and gas. Drink plenty of water while you increase your fiber.  The best sources of fiber include whole fruits and vegetables, whole grains, nuts, seeds, and beans. This information is not intended to replace advice given to you by your health care provider. Make sure you discuss any questions you have with your health care provider. Document Released: 03/03/2005 Document Revised: 01/05/2017 Document Reviewed: 01/05/2017 Elsevier Patient Education  2020 [employer].

## 2018-10-30 LAB — BASIC METABOLIC PANEL
Anion gap: 8 (ref 5–15)
BUN: 6 mg/dL (ref 6–20)
CO2: 27 mmol/L (ref 22–32)
Calcium: 8.7 mg/dL — ABNORMAL LOW (ref 8.9–10.3)
Chloride: 103 mmol/L (ref 98–111)
Creatinine, Ser: 0.76 mg/dL (ref 0.44–1.00)
GFR calc Af Amer: >60 mL/min (ref >60)
GFR calc non Af Amer: >60 mL/min (ref >60)
Glucose, Bld: 111 mg/dL — ABNORMAL HIGH (ref 70–99)
Potassium: 3.5 mmol/L (ref 3.5–5.1)
Sodium: 138 mmol/L (ref 135–145)

## 2018-10-30 LAB — CBC
HCT: 37.3 % (ref 36.0–46.0)
Hemoglobin: 11.8 g/dL — ABNORMAL LOW (ref 12.0–15.0)
MCH: 29.1 pg (ref 26.0–34.0)
MCHC: 31.6 g/dL (ref 30.0–36.0)
MCV: 92.1 fL (ref 80.0–100.0)
Platelets: 225 10*3/uL (ref 150–400)
RBC: 4.05 MIL/uL (ref 3.87–5.11)
RDW: 13.8 % (ref 11.5–15.5)
WBC: 10.7 10*3/uL — ABNORMAL HIGH (ref 4.0–10.5)
nRBC: 0 % (ref 0.0–0.2)

## 2018-10-30 MED ORDER — SACCHAROMYCES BOULARDII 250 MG PO CAPS: 250.0000 mg | ORAL_CAPSULE | Freq: Two times a day (BID) | ORAL | 0 refills | Status: AC

## 2018-10-30 MED ORDER — ONDANSETRON HCL 4 MG PO TABS: 4.0000 mg | ORAL_TABLET | Freq: Three times a day (TID) | ORAL | 0 refills | Status: DC | PRN

## 2018-10-30 MED ORDER — TRAMADOL HCL 50 MG PO TABS: 50.0000 mg | ORAL_TABLET | Freq: Four times a day (QID) | ORAL | 0 refills | Status: DC | PRN

## 2018-10-30 MED ORDER — AMOXICILLIN-POT CLAVULANATE 875-125 MG PO TABS: 1.0000 | ORAL_TABLET | Freq: Two times a day (BID) | ORAL | Status: DC

## 2018-10-30 MED ORDER — AMOXICILLIN-POT CLAVULANATE 875-125 MG PO TABS: 1.0000 | ORAL_TABLET | Freq: Two times a day (BID) | ORAL | 0 refills | Status: AC

## 2018-10-30 NOTE — Plan of Care (Signed)
Patient lying in bed this morning; states pain well controlled at this time. Abdominal pain and cramping has decreased from intensity it was yesterday. Has not had BM since yesterday morning. No needs expressed. Will continue to monitor.

## 2018-10-30 NOTE — Progress Notes (Signed)
   Subjective/Chief Complaint: Since C.diff test ordered, the patient has had no further diarrhea She is tolerating a diet Pain is improved   Objective: Vital signs in last 24 hours: Temp:  [97.9 F (36.6 C)-98.4 F (36.9 C)] 98.4 F (36.9 C) (08/15 0554) Pulse Rate:  [52-65] 52 (08/15 0554) Resp:  [16-18] 18 (08/15 0554) BP: (114-140)/(66-85) 121/80 (08/15 0554) SpO2:  [93 %-96 %] 93 % (08/15 0554) Last BM Date: 10/29/18  Intake/Output from previous day: 08/14 0701 - 08/15 0700 In: 1412 [P.O.:1200; I.V.:117.6; IV Piggyback:94.4] Out: -  Intake/Output this shift: Total I/O In: 120 [P.O.:120] Out: -   Exam: Abdomen remains soft and is much less tender  Lab Results:  Recent Labs    10/29/18 0325 10/30/18 0332  WBC 11.9* 10.7*  HGB 11.4* 11.8*  HCT 35.3* 37.3  PLT 248 225   BMET Recent Labs    10/29/18 0325 10/30/18 0332  NA 137 138  K 3.5 3.5  CL 104 103  CO2 22 27  GLUCOSE 109* 111*  BUN 7 6  CREATININE 0.77 0.76  CALCIUM 8.4* 8.7*   PT/INR No results for input(s): LABPROT, INR in the last 72 hours. ABG No results for input(s): PHART, HCO3 in the last 72 hours.  Invalid input(s): PCO2, PO2  Studies/Results: No results found.  Anti-infectives: Anti-infectives (From admission, onward)   Start     Dose/Rate Route Frequency Ordered Stop   10/26/18 1400  piperacillin-tazobactam (ZOSYN) IVPB 3.375 g     3.375 g 12.5 mL/hr over 240 Minutes Intravenous Every 8 hours 10/26/18 1252     10/26/18 0200  ciprofloxacin (CIPRO) IVPB 400 mg  Status:  Discontinued     400 mg 200 mL/hr over 60 Minutes Intravenous Every 12 hours 10/25/18 1422 10/26/18 1252   10/25/18 2000  metroNIDAZOLE (FLAGYL) IVPB 500 mg  Status:  Discontinued     500 mg 100 mL/hr over 60 Minutes Intravenous Every 8 hours 10/25/18 1423 10/26/18 1252   10/25/18 1415  ciprofloxacin (CIPRO) IVPB 400 mg  Status:  Discontinued     400 mg 200 mL/hr over 60 Minutes Intravenous Every 12 hours  10/25/18 1403 10/25/18 1422   10/25/18 1415  metroNIDAZOLE (FLAGYL) IVPB 500 mg  Status:  Discontinued     500 mg 100 mL/hr over 60 Minutes Intravenous Every 8 hours 10/25/18 1403 10/25/18 1423   10/25/18 1200  ciprofloxacin (CIPRO) IVPB 400 mg     400 mg 200 mL/hr over 60 Minutes Intravenous  Once 10/25/18 1158 10/25/18 1516   10/25/18 1200  metroNIDAZOLE (FLAGYL) IVPB 500 mg     500 mg 100 mL/hr over 60 Minutes Intravenous  Once 10/25/18 1158 10/25/18 1403      Assessment/Plan: s/p * No surgery found * Diverticulitis with microperforation  From a surgery standpoint, she can be discharged today.  Recommend Augmentin 875mg  bid for 7 more days.  Will have her follow-up with Dr. Leighton Ruff in our office ([facility]) to discuss potential partial colectomy in the future  LOS: 5 days    Coralie Keens MD 10/30/2018

## 2018-10-30 NOTE — Plan of Care (Signed)
Reviewed discharge instructions with patient; copy given. IV removed. Patient ready for discharge.  

## 2018-10-30 NOTE — Discharge Summary (Signed)
Physician Discharge Summary   Patient ID: Sydney Alvarado MRN: [id number] DOB/AGE: Sep 01, 1965 53 y.o.  Admit date: 10/25/2018 Discharge date: 10/30/2018  Primary Care Physician:  Patient, No Pcp Per   Recommendations for Outpatient Follow-up:  1. Follow-up with Dr. Marcello Moores in 1 to 2 weeks 2. Recommend outpatient colonoscopy after acute diverticulitis episode has resolved  Home Health: None  Equipment/Devices:   Discharge Condition: stable  CODE STATUS: FULL  Diet recommendation: Soft   Discharge Diagnoses:    . Abdominal pain . Acute sigmoid diverticulitis with microperforation . Leukocytosis Recent left knee surgery  Consults: General surgery    Allergies:   Allergies  Allergen Reactions  . Sulfa Antibiotics Hives     DISCHARGE MEDICATIONS: Allergies as of 10/30/2018      Reactions   Sulfa Antibiotics Hives      Medication List    STOP taking these medications   HYDROcodone-acetaminophen 5-325 MG tablet Commonly known as: NORCO/VICODIN     TAKE these medications   amoxicillin-clavulanate 875-125 MG tablet Commonly known as: AUGMENTIN Take 1 tablet by mouth 2 (two) times daily for 10 days.   calcium carbonate 750 MG chewable tablet Commonly known as: TUMS EX Chew 2 tablets by mouth daily as needed for heartburn.   diclofenac 50 MG EC tablet Commonly known as: VOLTAREN Take 50 mg by mouth 2 (two) times daily as needed for mild pain.   Multivitamin Gummies Womens [employer] 2 each by mouth daily.   ondansetron 4 MG tablet Commonly known as: ZOFRAN Take 1 tablet (4 mg total) by mouth every 8 (eight) hours as needed for nausea or vomiting.   saccharomyces boulardii 250 MG capsule Commonly known as: Florastor Take 1 capsule (250 mg total) by mouth 2 (two) times daily for 14 days. Any Probiotic available while on antibiotic is okay   traMADol 50 MG tablet Commonly known as: Ultram Take 1 tablet (50 mg total) by mouth every 6 (six) hours as needed  for moderate pain or severe pain.        Brief H and P: For complete details please refer to admission H and P, but in brief 53 year old woman no significant PMH presented with 24 hours of severe, excruciating lower abdominal pain with associated nausea and vomiting. Initial CT was read as negative but upon reread showed diverticulitis in the distal sigmoid area with microperforation. Admitted for IV antibiotics.  Hospital Course:   Acute sigmoid diverticulitis with microperforation -Patient presented with nausea vomiting and severe lower abdominal pain -Per patient, had prior history of diverticulitis and colonoscopy in August 2019 -CT abdomen and pelvis showed focal apparent loculated fluid tracking from the right adnexa to the level of cecum, appendix normal, focal area of decreased attenuation in the left lobe of the liver measuring 3.2x 2.7 cm. Acute diverticulitis and microperforation -General surgery was consulted.  Patient was placed on n.p.o. status with IV fluids and pain control, IV Zosyn.  Patient had slow and progressive improvement, currently tolerating soft diet.  Leukocytosis has improved.  Transitioned to oral Augmentin at the time of discharge.   Recent knee surgery, left -Per patient she had knee surgery on 7/29.  -PT evaluation, ambulatory  Day of Discharge S: Lower abdominal pain improving, no further diarrhea.  No nausea, vomiting  BP 121/80 (BP Location: Left Arm)   Pulse (!) 52   Temp 98.4 F (36.9 C) (Oral)   Resp 18   Ht 5\' 4"  (1.626 m)   Wt 90.7 kg  LMP 10/01/2018   SpO2 93%   BMI 34.33 kg/m   Physical Exam: General: Alert and awake oriented x3 not in any acute distress. HEENT: anicteric sclera, pupils reactive to light and accommodation CVS: S1-S2 clear no murmur rubs or gallops Chest: clear to auscultation bilaterally, no wheezing rales or rhonchi Abdomen: soft mild lower abdominal TTP  nondistended, normal bowel sounds Extremities: no  cyanosis, clubbing or edema noted bilaterally Neuro: Cranial nerves II-XII intact, no focal neurological deficits   The results of significant diagnostics from this hospitalization (including imaging, microbiology, ancillary and laboratory) are listed below for reference.      Procedures/Studies:  US Pelvis Transvaginal Non-ob (tv Only)  Result Date: 10/25/2018 CLINICAL DATA:  Pelvic pain for 1-2 days. Followup ruptured hemorrhagic cyst on CT. LMP 10/01/2018. EXAM: TRANSABDOMINAL AND TRANSVAGINAL ULTRASOUND OF PELVIS DOPPLER ULTRASOUND OF OVARIES TECHNIQUE: Both transabdominal and transvaginal ultrasound examinations of the pelvis were performed. Transabdominal technique was performed for global imaging of the pelvis including uterus, ovaries, adnexal regions, and pelvic cul-de-sac. It was necessary to proceed with endovaginal exam following the transabdominal exam to visualize the endometrium and ovaries to better advantage. Color and duplex Doppler ultrasound was utilized to evaluate blood flow to the ovaries. COMPARISON:  Abdominopelvic CT same date. FINDINGS: Uterus Measurements: 9.5 x 4.7 x 5.5 cm = volume: 129 mL. No fibroids or other mass visualized. Endometrium Thickness: 11 mm.  No focal abnormality visualized. Right ovary Measurements: 3.6 x 1.8 x 2.0 cm = volume: 6.9 mL. Normal appearance/no adnexal mass. Left ovary Measurements: 2.7 x 1.5 x 2.4 cm = volume: 5.1 mL. Normal appearance/no adnexal mass. Pulsed Doppler evaluation of both ovaries demonstrates normal low-resistance arterial and venous waveforms. Other findings There is a small amount of free pelvic fluid, within physiologic limits. IMPRESSION: 1. Normal pelvic ultrasound. The uterus and both ovaries appear normal. 2. To my review of the prior CT, there is soft tissue stranding in the pelvis surrounding the sigmoid colon. There are tiny extraluminal air bubbles, and these findings are consistent with acute diverticulitis. No evidence  of abscess or bowel obstruction. Electronically Signed   By: Richardean Sale M.D.   On: 10/25/2018 11:44   Ct Abdomen Pelvis W Contrast  Addendum Date: 10/25/2018   ADDENDUM REPORT: 10/25/2018 11:50 ADDENDUM: Original report read by Dr. Jasmine December. Addendum by Dr. Lin Landsman. This case was reviewed at the time of interpretation of subsequent pelvic ultrasound. There is soft tissue stranding in the pelvic fat surrounding the sigmoid colon which demonstrates multiple diverticula. There are tiny extraluminal air bubbles consistent with acute diverticulitis and microperforation. No abscess or bowel obstruction identified. The appendix appears normal. These results were called by telephone at the time of interpretation on 10/25/2018 at 11:49 am to Dr. Alto Denver, who verbally acknowledged these results. Electronically Signed   By: Richardean Sale M.D.   On: 10/25/2018 11:50   Result Date: 10/25/2018 CLINICAL DATA:  Generalized abdominal pain EXAM: CT ABDOMEN AND PELVIS WITH CONTRAST TECHNIQUE: Multidetector CT imaging of the abdomen and pelvis was performed using the standard protocol following bolus administration of intravenous contrast. Oral contrast w was also administered. CONTRAST:  180mL OMNIPAQUE IOHEXOL 300 MG/ML  SOLN COMPARISON:  None. FINDINGS: Lower chest: There is bibasilar atelectatic change. No lung base edema or consolidation is evident. Hepatobiliary: There is an area of decreased attenuation in the left lobe of the liver measuring 3.2 x 2.7 cm. There is underlying hepatic steatosis. There is a probable cyst in the gallbladder  fossa measuring 1.0 x 0.8 cm. No other focal liver lesions are identified. Gallbladder wall is not appreciably thickened. There is no biliary duct dilatation. Pancreas: There is no appreciable pancreatic mass or inflammatory focus. Spleen: No splenic lesions are evident. Adrenals/Urinary Tract: Adrenals bilaterally appear unremarkable. Kidneys bilaterally show no evident mass or  hydronephrosis on either side. There is no renal or ureteral calculus on either side. Bladder wall thickness is within normal limits. Stomach/Bowel: There are w scattered sigmoid diverticula without diverticulitis. There is no appreciable bowel wall or mesenteric thickening. There is moderate stool in the colon. The terminal ileum appears unremarkable. No bowel obstruction evident. No free air or portal venous air. Vascular/Lymphatic: There are foci of aortic atherosclerosis. No aneurysm evident. No evident adenopathy in the abdomen or pelvis. Reproductive: The uterus is anteverted. There is an apparent recent right ovarian cyst rupture with partial collapse. Remaining cyst measures 1.7 x 1.5 cm. There is fluid tracking from the right adnexa into the level of the cecum. Other: Appendix appears normal. No abscess is evident in the abdomen pelvis. No ascites is evident. There is a minimal umbilical hernia containing only fat. Musculoskeletal: There are no blastic or lytic bone lesions. No intramuscular lesions are evident IMPRESSION: 1. There is focal apparent loculated fluid tracking from the right adnexa to the level of the cecum. Nearby appendix does not appear involved and appears normal. Suspect recent right adnexal region cyst rupture with hemorrhagic fluid. No other pelvic mass. 2. Focal area of decreased attenuation in the left lobe of the liver measuring 3.2 x 2.7 cm. Etiology for this lesion uncertain. It may be prudent to consider liver MRI pre and serial post-contrast to further evaluate. If there is a contraindication MRI, CT pre and serial post-contrast would be the alternative imaging study of choice to further evaluate. 3.  Underlying hepatic steatosis. 4. Sigmoid diverticulosis. No bowel obstruction. No abscess in the abdomen or pelvis. Electronically Signed: By: Lowella Grip III M.D. On: 10/25/2018 08:09   US Pelvic Doppler (torsion R/o Or Mass Arterial Flow)  Result Date:  10/25/2018 CLINICAL DATA:  Pelvic pain for 1-2 days. Followup ruptured hemorrhagic cyst on CT. LMP 10/01/2018. EXAM: TRANSABDOMINAL AND TRANSVAGINAL ULTRASOUND OF PELVIS DOPPLER ULTRASOUND OF OVARIES TECHNIQUE: Both transabdominal and transvaginal ultrasound examinations of the pelvis were performed. Transabdominal technique was performed for global imaging of the pelvis including uterus, ovaries, adnexal regions, and pelvic cul-de-sac. It was necessary to proceed with endovaginal exam following the transabdominal exam to visualize the endometrium and ovaries to better advantage. Color and duplex Doppler ultrasound was utilized to evaluate blood flow to the ovaries. COMPARISON:  Abdominopelvic CT same date. FINDINGS: Uterus Measurements: 9.5 x 4.7 x 5.5 cm = volume: 129 mL. No fibroids or other mass visualized. Endometrium Thickness: 11 mm.  No focal abnormality visualized. Right ovary Measurements: 3.6 x 1.8 x 2.0 cm = volume: 6.9 mL. Normal appearance/no adnexal mass. Left ovary Measurements: 2.7 x 1.5 x 2.4 cm = volume: 5.1 mL. Normal appearance/no adnexal mass. Pulsed Doppler evaluation of both ovaries demonstrates normal low-resistance arterial and venous waveforms. Other findings There is a small amount of free pelvic fluid, within physiologic limits. IMPRESSION: 1. Normal pelvic ultrasound. The uterus and both ovaries appear normal. 2. To my review of the prior CT, there is soft tissue stranding in the pelvis surrounding the sigmoid colon. There are tiny extraluminal air bubbles, and these findings are consistent with acute diverticulitis. No evidence of abscess or bowel obstruction. Electronically  Signed   By: Richardean Sale M.D.   On: 10/25/2018 11:44       LAB RESULTS: Basic Metabolic Panel: Recent Labs  Lab 10/29/18 0325 10/30/18 0332  NA 137 138  K 3.5 3.5  CL 104 103  CO2 22 27  GLUCOSE 109* 111*  BUN 7 6  CREATININE 0.77 0.76  CALCIUM 8.4* 8.7*   Liver Function Tests: Recent Labs   Lab 10/25/18 0625  AST 18  ALT 22  ALKPHOS 66  BILITOT 1.0  PROT 8.1  ALBUMIN 4.0   Recent Labs  Lab 10/25/18 0625  LIPASE 28   No results for input(s): AMMONIA in the last 168 hours. CBC: Recent Labs  Lab 10/25/18 0625  10/29/18 0325 10/30/18 0332  WBC 27.8*   < > 11.9* 10.7*  NEUTROABS 24.6*  --   --   --   HGB 13.8   < > 11.4* 11.8*  HCT 43.4   < > 35.3* 37.3  MCV 90.4   < > 91.2 92.1  PLT 232   < > 248 225   < > = values in this interval not displayed.   Cardiac Enzymes: No results for input(s): CKTOTAL, CKMB, CKMBINDEX, TROPONINI in the last 168 hours. BNP: Invalid input(s): POCBNP CBG: No results for input(s): GLUCAP in the last 168 hours.    Disposition and Follow-up: Discharge Instructions    Diet - low sodium heart healthy   Complete by: As directed    Discharge instructions   Complete by: As directed    SOFT DIET   Increase activity slowly   Complete by: As directed        DISPOSITION: home    DISCHARGE FOLLOW-UP Follow-up Information    Leighton Ruff, MD. Call.   Specialty: General Surgery Why: Please call to confirm an appointment time. Please arrive 30 minutes prior to your appointment for paperwork. Please bring a copy of your photo ID and insurance card.  Contact information: [address] [address] [phone]            Time coordinating discharge:  8mins   Signed:   Estill Cotta M.D. [facility] 10/30/2018, 1:56 PM

## 2018-11-23 ENCOUNTER — Other Ambulatory Visit: Payer: Self-pay | Admitting: Gastroenterology

## 2018-11-23 DIAGNOSIS — K769 Liver disease, unspecified: Secondary | ICD-10-CM

## 2018-11-23 DIAGNOSIS — R9389 Abnormal findings on diagnostic imaging of other specified body structures: Secondary | ICD-10-CM

## 2018-12-18 ENCOUNTER — Other Ambulatory Visit: Payer: Self-pay

## 2018-12-18 ENCOUNTER — Ambulatory Visit
Admission: RE | Admit: 2018-12-18 | Discharge: 2018-12-18 | Disposition: A | Discharge status: Home / Self Care | Payer: 59 | Source: Ambulatory Visit | Attending: Gastroenterology | Admitting: Gastroenterology

## 2018-12-18 DIAGNOSIS — K769 Liver disease, unspecified: Secondary | ICD-10-CM

## 2018-12-18 DIAGNOSIS — R9389 Abnormal findings on diagnostic imaging of other specified body structures: Secondary | ICD-10-CM

## 2018-12-18 MED ORDER — GADOBENATE DIMEGLUMINE 529 MG/ML IV SOLN
17.0000 mL | Freq: Once | INTRAVENOUS | Status: AC | PRN
  Administered 2018-12-18: 17 mL via INTRAVENOUS

## 2018-12-28 ENCOUNTER — Ambulatory Visit: Payer: Self-pay | Admitting: General Surgery

## 2018-12-28 NOTE — H&P (Signed)
History of Present Illness Leighton Ruff MD; [id number] 12:10 PM) The patient is a 53 year old female who presents with diverticulitis. 53 year old female who presents to the office with complaints of diverticulitis. Last May she developed an episode of left lower quadrant pain while on vacation. She was seen in urgent care and diagnosed clinically with diverticulitis. She was placed on antibiotics and her symptoms resolved. She did undergo a colonoscopy with Dr. Collene Mares afterwards that showed diverticulosis and 2 polyps. Follow-up colonoscopy was recommended in 5 years. At this time, she started a fiber supplement and increased her water intake. She was having good bowel function. This year she has had multiple orthopedic injuries requiring surgery. After one of these injuries, she developed some constipation due to narcotics. She then developed left lower quadrant pain. She presented to the emergency department and was noted to have diverticulitis. She was placed on IV antibiotics and her symptoms resolved. She is now approximately 3-4 weeks status post this episode. She is having no further pain or fevers. She is having regular bowel function.   Past Surgical History Nance Pew, [place]; 11/15/2018 11:28 AM) Cesarean Section - Multiple  Colon Polyp Removal - Colonoscopy  Foot Surgery  Bilateral. Oral Surgery   Diagnostic Studies History Nance Pew, CMA; 11/15/2018 11:28 AM) Colonoscopy  1-5 years ago Mammogram  within last year Pap Smear  1-5 years ago  Allergies Nance Pew, CMA; 11/15/2018 11:29 AM) Sulfa Drugs  Allergies Reconciled   Medication History (Sabrina Canty, CMA; 11/15/2018 11:30 AM) traMADol HCl (50MG  Tablet, Oral) Active. Cephalexin (500MG  Capsule, Oral) Active. [employer] Digestive Probiotic (250MG  Capsule, Oral) Active. Diclofenac Sodium (50MG  Tablet DR, Oral) Active. Ondansetron HCl (4MG  Tablet, Oral) Active. Calcium Antacid (500MG  Tablet  Chewable, Oral) Active. Medications Reconciled  Social History Nance Pew, CMA; 11/15/2018 11:28 AM) Caffeine use  Coffee, Tea. No alcohol use  No drug use  Tobacco use  Never smoker.  Family History Nance Pew, [place]; 11/15/2018 11:28 AM) Diabetes Mellitus  Mother. Heart Disease  Father. Thyroid problems  Mother.  Pregnancy / Birth History Nance Pew, [place]; 11/15/2018 11:28 AM) Age at menarche  43 years. Contraceptive History  Oral contraceptives. Gravida  4 Irregular periods  Length (months) of breastfeeding  7-12 Maternal age  28-25 Para  2  Other Problems Nance Pew, CMA; 11/15/2018 11:28 AM) Diverticulosis  Gastroesophageal Reflux Disease  General anesthesia - complications  Hemorrhoids     Review of Systems ([place]; 11/15/2018 11:28 AM) General Present- Appetite Loss and Fatigue. Not Present- Chills, Fever, Night Sweats, Weight Gain and Weight Loss. Skin Not Present- Change in Wart/Mole, Dryness, Hives, Jaundice, New Lesions, Non-Healing Wounds, Rash and Ulcer. HEENT Present- Wears glasses/contact lenses. Not Present- Earache, Hearing Loss, Hoarseness, Nose Bleed, Oral Ulcers, Ringing in the Ears, Seasonal Allergies, Sinus Pain, Sore Throat, Visual Disturbances and Yellow Eyes. Respiratory Present- Snoring. Not Present- Bloody sputum, Chronic Cough, Difficulty Breathing and Wheezing. Breast Not Present- Breast Mass, Breast Pain, Nipple Discharge and Skin Changes. Cardiovascular Not Present- Chest Pain, Difficulty Breathing Lying Down, Leg Cramps, Palpitations, Rapid Heart Rate, Shortness of Breath and Swelling of Extremities. Gastrointestinal Present- Hemorrhoids and Indigestion. Not Present- Abdominal Pain, Bloating, Bloody Stool, Change in Bowel Habits, Chronic diarrhea, Constipation, Difficulty Swallowing, Excessive gas, Gets full quickly at meals, Nausea, Rectal Pain and Vomiting. Female Genitourinary Not Present- Frequency,  Nocturia, Painful Urination, Pelvic Pain and Urgency. Musculoskeletal Not Present- Back Pain, Joint Pain, Joint Stiffness, Muscle Pain, Muscle Weakness and Swelling of Extremities. Neurological Not  Present- Decreased Memory, Fainting, Headaches, Numbness, Seizures, Tingling, Tremor, Trouble walking and Weakness. Psychiatric Not Present- Anxiety, Bipolar, Change in Sleep Pattern, Depression, Fearful and Frequent crying. Endocrine Not Present- Cold Intolerance, Excessive Hunger, Hair Changes, Heat Intolerance, Hot flashes and New Diabetes. Hematology Not Present- Blood Thinners, Easy Bruising, Excessive bleeding, Gland problems, HIV and Persistent Infections.  Vitals (Sabrina Canty CMA; 11/15/2018 11:30 AM) 11/15/2018 11:30 AM Weight: 194 lb Height: 64in Body Surface Area: 1.93 m Body Mass Index: 33.3 kg/m  Temp.: 98.2F (Temporal)  Pulse: 98 (Regular)  BP: 128/64(Sitting, Left Arm, Standard)       Physical Exam Leighton Ruff MD; [id number] 12:11 PM) General Mental Status-Alert. General Appearance-Not in acute distress. Build & Nutrition-Well nourished. Posture-Normal posture. Gait-Normal.  Head and Neck Head-normocephalic, atraumatic with no lesions or palpable masses. Trachea-midline.  Chest and Lung Exam Chest and lung exam reveals -on auscultation, normal breath sounds, no adventitious sounds and normal vocal resonance.  Cardiovascular Cardiovascular examination reveals -normal heart sounds, regular rate and rhythm with no murmurs and no digital clubbing, cyanosis, edema, increased warmth or tenderness.  Abdomen Inspection Inspection of the abdomen reveals - No Hernias. Palpation/Percussion Palpation and Percussion of the abdomen reveal - Soft, Non Tender, No Rigidity (guarding), No hepatosplenomegaly and No Palpable abdominal masses.  Neurologic Neurologic evaluation reveals -alert and oriented x 3 with no impairment of recent or remote  memory, normal attention span and ability to concentrate, normal sensation and normal coordination.  Musculoskeletal Normal Exam - Bilateral-Upper Extremity Strength Normal and Lower Extremity Strength Normal.    Assessment & Plan Leighton Ruff MD; [id number] 12:08 PM)  DIVERTICULITIS [id number]) Impression: 53 year old female who presents to the office after recent hospitalization for diverticulitis in early August 2020. This occurred after a bout of constipation due to narcotics from orthopedic surgery. She has had one other presumed episode of diverticulitis but no imaging or lab work was performed. Colonoscopy was performed approximately 1 year ago and was significant for diverticulosis and 2 colon polyps. We discussed the role for sigmoidectomy in the setting of recurrent diverticulitis. I do not see any definitive reasons that she absolutely has to have surgery, but I recommended that she consider sigmoidectomy to reduce her risk of further episodes in the future. The surgery and anatomy were described to the patient as well as the risks of surgery and the possible complications.  These include: Bleeding, deep abdominal infections and possible wound complications such as hernia and infection, damage to adjacent structures, leak of surgical connections, which can lead to other surgeries and possibly an ostomy, possible need for other procedures, such as abscess drains in radiology, possible prolonged hospital stay, possible diarrhea from removal of part of the colon, possible constipation from narcotics, prolonged fatigue/weakness or appetite loss, possible early recurrence of of disease, possible complications of their medical problems such as heart disease or arrhythmias or lung problems, death (less than 1%). I believe the patient understands and wishes to proceed with the surgery.

## 2019-02-01 ENCOUNTER — Other Ambulatory Visit (HOSPITAL_COMMUNITY): Payer: 59

## 2019-02-22 ENCOUNTER — Other Ambulatory Visit (HOSPITAL_COMMUNITY)
Admission: RE | Admit: 2019-02-22 | Discharge: 2019-02-22 | Disposition: A | Discharge status: Home / Self Care | Payer: 59 | Source: Ambulatory Visit | Attending: General Surgery | Admitting: General Surgery

## 2019-02-22 DIAGNOSIS — Z20828 Contact with and (suspected) exposure to other viral communicable diseases: Secondary | ICD-10-CM | POA: Insufficient documentation

## 2019-02-22 DIAGNOSIS — Z01812 Encounter for preprocedural laboratory examination: Secondary | ICD-10-CM | POA: Insufficient documentation

## 2019-02-22 NOTE — Patient Instructions (Addendum)
DUE TO COVID-19 ONLY ONE VISITOR IS ALLOWED TO COME WITH YOU AND STAY IN THE WAITING ROOM ONLY DURING PRE OP AND PROCEDURE DAY OF SURGERY. THE 1 VISITOR MAY VISIT WITH YOU AFTER SURGERY IN YOUR PRIVATE ROOM DURING VISITING HOURS ONLY!  YOU  HAD  A COVID 19 TEST ON 02-22-2019.  ONCE YOUR COVID TEST IS COMPLETED, PLEASE BEGIN THE QUARANTINE INSTRUCTIONS AS OUTLINED IN YOUR HANDOUT.                Brisia Billing [facility]    Your procedure is scheduled on: 02-25-2019   Report to [facility] Main  Entrance    Report to admitting at  1130 AM     Call this number if you have problems the morning of surgery [phone]    Remember: Do not eat food  :After Midnight.   Wednesday NIGHT CLEAR LIQUIDS WITH BOWEL PREP ALL DAY Thursday 02-24-2019. FOLLOW ALL BOWEL PREP INSTRUCTIONS FROM DR Marcello Moores.     DRINK 2 PRESURGERY ENSURE DRINKS THE NIGHT BEFORE SURGERY AT  1000 PM  PLEASE FINISH PRESURGERY ENSURE DRINK PER SURGEON ORDER 3 HOURS PRIOR TO SCHEDULED SURGERY TIME WHICH NEEDS TO BE COMPLETED AT 1030 AM.    CLEAR LIQUID DIET   Foods Allowed                                                                     Foods Excluded  Coffee and tea, regular and decaf                             liquids that you cannot  Plain Jell-O any favor except red or purple                                           see through such as: Fruit ices (not with fruit pulp)                                     milk, soups, orange juice  Iced Popsicles                                    All solid food Carbonated beverages, regular and diet                                    Cranberry, grape and apple juices Sports drinks like Gatorade Lightly seasoned clear broth or consume(fat free) Sugar, honey syrup  Sample Menu Breakfast                                Lunch  Supper Cranberry juice                    Beef broth                            Chicken broth Jell-O                                      Grape juice                           Apple juice Coffee or tea                        Jell-O                                      Popsicle                                                Coffee or tea                        Coffee or tea  _____________________________________________________________________    Take these medicines the morning of surgery with A SIP OF WATER: NONE  BRUSH YOUR TEETH MORNING OF SURGERY AND RINSE YOUR MOUTH OUT, NO CHEWING GUM CANDY OR MINTS.                                 You may not have any metal on your body including hair pins and              piercings     Do not wear jewelry, make-up, lotions, powders or perfumes, deodorant             Do not wear nail polish on your fingernails.  Do not shave  48 hours prior to surgery.     Do not bring valuables to the hospital. [place].  Contacts, dentures or bridgework may not be worn into surgery.  Bring in your overnight bag                 Please read over the following fact sheets you were given: _____________________________________________________________________             [facility] - Preparing for Surgery Before surgery, you can play an important role.  Because skin is not sterile, your skin needs to be as free of germs as possible.  You can reduce the number of germs on your skin by washing with CHG (chlorahexidine gluconate) soap before surgery.  CHG is an antiseptic cleaner which kills germs and bonds with the skin to continue killing germs even after washing. Please DO NOT use if you have an allergy to CHG or antibacterial soaps.  If your skin becomes reddened/irritated stop using the CHG and inform your nurse when you arrive at Short Stay. Do not shave (including legs  and underarms) for at least 48 hours prior to the first CHG shower.  You may shave your face/neck. Please follow these instructions carefully:  1.  Shower with CHG  Soap the night before surgery and the  morning of Surgery.  2.  If you choose to wash your hair, wash your hair first as usual with your  normal  shampoo.  3.  After you shampoo, rinse your hair and body thoroughly to remove the  shampoo.                           4.  Use CHG as you would any other liquid soap.  You can apply chg directly  to the skin and wash                       Gently with a scrungie or clean washcloth.  5.  Apply the CHG Soap to your body ONLY FROM THE NECK DOWN.   Do not use on face/ open                           Wound or open sores. Avoid contact with eyes, ears mouth and genitals (private parts).                       Wash face,  Genitals (private parts) with your normal soap.             6.  Wash thoroughly, paying special attention to the area where your surgery  will be performed.  7.  Thoroughly rinse your body with warm water from the neck down.  8.  DO NOT shower/wash with your normal soap after using and rinsing off  the CHG Soap.                9.  Pat yourself dry with a clean towel.            10.  Wear clean pajamas.            11.  Place clean sheets on your bed the night of your first shower and do not  sleep with pets. Day of Surgery : Do not apply any lotions/deodorants the morning of surgery.  Please wear clean clothes to the hospital/surgery center.  FAILURE TO FOLLOW THESE INSTRUCTIONS MAY RESULT IN THE CANCELLATION OF YOUR SURGERY PATIENT SIGNATURE_________________________________  NURSE SIGNATURE__________________________________  ________________________________________________________________________

## 2019-02-23 ENCOUNTER — Encounter (HOSPITAL_COMMUNITY)
Admission: RE | Admit: 2019-02-23 | Discharge: 2019-02-23 | Disposition: A | Discharge status: Home / Self Care | Payer: 59 | Source: Ambulatory Visit | Attending: General Surgery | Admitting: General Surgery

## 2019-02-23 ENCOUNTER — Other Ambulatory Visit: Payer: Self-pay

## 2019-02-23 ENCOUNTER — Encounter (HOSPITAL_COMMUNITY): Payer: Self-pay

## 2019-02-23 DIAGNOSIS — K5790 Diverticulosis of intestine, part unspecified, without perforation or abscess without bleeding: Secondary | ICD-10-CM | POA: Insufficient documentation

## 2019-02-23 DIAGNOSIS — Z01812 Encounter for preprocedural laboratory examination: Secondary | ICD-10-CM | POA: Insufficient documentation

## 2019-02-23 HISTORY — DX: Other specified postprocedural states: Z98.890

## 2019-02-23 HISTORY — DX: Nausea with vomiting, unspecified: R11.2

## 2019-02-23 LAB — CBC
HCT: 43.3 % (ref 36.0–46.0)
Hemoglobin: 13.6 g/dL (ref 12.0–15.0)
MCH: 28.5 pg (ref 26.0–34.0)
MCHC: 31.4 g/dL (ref 30.0–36.0)
MCV: 90.6 fL (ref 80.0–100.0)
Platelets: 242 10*3/uL (ref 150–400)
RBC: 4.78 MIL/uL (ref 3.87–5.11)
RDW: 13.1 % (ref 11.5–15.5)
WBC: 8.8 10*3/uL (ref 4.0–10.5)
nRBC: 0 % (ref 0.0–0.2)

## 2019-02-23 LAB — NOVEL CORONAVIRUS, NAA (HOSP ORDER, SEND-OUT TO REF LAB; TAT 18-24 HRS): SARS-CoV-2, NAA: NOT DETECTED

## 2019-02-23 LAB — ABO/RH: ABO/RH(D): A NEG

## 2019-02-23 NOTE — Progress Notes (Signed)
PCP - none Cardiologist - none  Chest x-ray -  EKG - n/a Stress Test - n/a ECHO - n/a Cardiac Cath - n/a  Sleep Study - n/a CPAP - n/a  Fasting Blood Sugar - n/a Checks Blood Sugar _____ times a day  Blood Thinner Instructions:n/a Aspirin Instructions: Last Dose:  Anesthesia review:   Patient denies shortness of breath, fever, cough and chest pain at PAT appointment   Patient verbalized understanding of instructions that were given to them at the PAT appointment. Patient was also instructed that they will need to review over the PAT instructions again at home before surgery.

## 2019-02-24 MED ORDER — BUPIVACAINE LIPOSOME 1.3 % IJ SUSP
20.0000 mL | Freq: Once | INTRAMUSCULAR | Status: AC
  Filled 2019-02-24: qty 20

## 2019-02-24 NOTE — Anesthesia Preprocedure Evaluation (Addendum)
Anesthesia Evaluation  Patient identified by MRN, date of birth, ID band Patient awake    Reviewed: Allergy & Precautions, NPO status , Patient's Chart, lab work & pertinent test results  History of Anesthesia Complications (+) PONV and history of anesthetic complications  Airway Mallampati: II  TM Distance: >3 FB Neck ROM: Full    Dental  (+) Teeth Intact, Dental Advisory Given   Pulmonary neg pulmonary ROS,    Pulmonary exam normal breath sounds clear to auscultation       Cardiovascular negative cardio ROS Normal cardiovascular exam Rhythm:Regular Rate:Normal     Neuro/Psych negative neurological ROS  negative psych ROS   GI/Hepatic Neg liver ROS, Diverticulitis large intestine   Endo/Other  negative endocrine ROS  Renal/GU negative Renal ROS  negative genitourinary   Musculoskeletal negative musculoskeletal ROS (+)   Abdominal Normal abdominal exam  (+)   Peds  Hematology negative hematology ROS (+)   Anesthesia Other Findings Day of surgery medications reviewed with the patient.  Reproductive/Obstetrics negative OB ROS                            Anesthesia Physical Anesthesia Plan  ASA: II  Anesthesia Plan: General   Post-op Pain Management:    Induction: Intravenous  PONV Risk Score and Plan: 4 or greater and Ondansetron, Dexamethasone, Midazolam, Scopolamine patch - Pre-op, Propofol infusion, Treatment may vary due to age or medical condition and Metaclopromide  Airway Management Planned: Oral ETT  Additional Equipment: None  Intra-op Plan:   Post-operative Plan: Extubation in OR  Informed Consent: I have reviewed the patients History and Physical, chart, labs and discussed the procedure including the risks, benefits and alternatives for the proposed anesthesia with the patient or authorized representative who has indicated his/her understanding and acceptance.      Dental advisory given  Plan Discussed with: CRNA  Anesthesia Plan Comments:         Anesthesia Quick Evaluation

## 2019-02-25 ENCOUNTER — Other Ambulatory Visit: Payer: Self-pay

## 2019-02-25 ENCOUNTER — Inpatient Hospital Stay (HOSPITAL_COMMUNITY): Payer: 59 | Admitting: Anesthesiology

## 2019-02-25 ENCOUNTER — Inpatient Hospital Stay (HOSPITAL_COMMUNITY)
Admission: RE | Admit: 2019-02-25 | Discharge: 2019-02-27 | DRG: 331 | Disposition: A | Discharge status: Home / Self Care | Payer: 59 | Attending: General Surgery | Admitting: General Surgery

## 2019-02-25 ENCOUNTER — Encounter (HOSPITAL_COMMUNITY): Payer: Self-pay | Admitting: General Surgery

## 2019-02-25 ENCOUNTER — Encounter (HOSPITAL_COMMUNITY)
Admission: RE | Disposition: A | Discharge status: Home / Self Care | Payer: Self-pay | Source: Home / Self Care | Attending: General Surgery

## 2019-02-25 DIAGNOSIS — Z882 Allergy status to sulfonamides status: Secondary | ICD-10-CM

## 2019-02-25 DIAGNOSIS — Z86010 Personal history of colonic polyps: Secondary | ICD-10-CM

## 2019-02-25 DIAGNOSIS — K219 Gastro-esophageal reflux disease without esophagitis: Secondary | ICD-10-CM | POA: Diagnosis present

## 2019-02-25 DIAGNOSIS — Z20828 Contact with and (suspected) exposure to other viral communicable diseases: Secondary | ICD-10-CM | POA: Diagnosis present

## 2019-02-25 DIAGNOSIS — Z8249 Family history of ischemic heart disease and other diseases of the circulatory system: Secondary | ICD-10-CM | POA: Diagnosis not present

## 2019-02-25 DIAGNOSIS — Z79891 Long term (current) use of opiate analgesic: Secondary | ICD-10-CM | POA: Diagnosis not present

## 2019-02-25 DIAGNOSIS — K5732 Diverticulitis of large intestine without perforation or abscess without bleeding: Principal | ICD-10-CM | POA: Diagnosis present

## 2019-02-25 DIAGNOSIS — Z833 Family history of diabetes mellitus: Secondary | ICD-10-CM | POA: Diagnosis not present

## 2019-02-25 DIAGNOSIS — Z79899 Other long term (current) drug therapy: Secondary | ICD-10-CM

## 2019-02-25 DIAGNOSIS — K5790 Diverticulosis of intestine, part unspecified, without perforation or abscess without bleeding: Secondary | ICD-10-CM | POA: Diagnosis present

## 2019-02-25 DIAGNOSIS — K5720 Diverticulitis of large intestine with perforation and abscess without bleeding: Secondary | ICD-10-CM | POA: Diagnosis present

## 2019-02-25 LAB — PREGNANCY, URINE: Preg Test, Ur: NEGATIVE

## 2019-02-25 LAB — TYPE AND SCREEN
ABO/RH(D): A NEG
Antibody Screen: NEGATIVE

## 2019-02-25 SURGERY — COLECTOMY, PARTIAL, ROBOT-ASSISTED, LAPAROSCOPIC
Anesthesia: General | Site: Abdomen

## 2019-02-25 MED ORDER — TRAMADOL HCL 50 MG PO TABS
50.0000 mg | ORAL_TABLET | Freq: Four times a day (QID) | ORAL | Status: DC | PRN
  Administered 2019-02-26: 50 mg via ORAL
  Filled 2019-02-25 (×2): qty 1

## 2019-02-25 MED ORDER — DEXAMETHASONE SODIUM PHOSPHATE 10 MG/ML IJ SOLN
INTRAMUSCULAR | Status: AC
  Filled 2019-02-25: qty 1

## 2019-02-25 MED ORDER — FENTANYL CITRATE (PF) 100 MCG/2ML IJ SOLN
INTRAMUSCULAR | Status: DC | PRN
  Administered 2019-02-25: 100 ug via INTRAVENOUS

## 2019-02-25 MED ORDER — BUPIVACAINE HCL (PF) 0.25 % IJ SOLN
INTRAMUSCULAR | Status: DC | PRN
  Administered 2019-02-25: 30 mL

## 2019-02-25 MED ORDER — EPHEDRINE 5 MG/ML INJ
INTRAVENOUS | Status: AC
  Filled 2019-02-25: qty 10

## 2019-02-25 MED ORDER — BUPIVACAINE LIPOSOME 1.3 % IJ SUSP
INTRAMUSCULAR | Status: DC | PRN
  Administered 2019-02-25: 20 mL

## 2019-02-25 MED ORDER — ALVIMOPAN 12 MG PO CAPS
12.0000 mg | ORAL_CAPSULE | ORAL | Status: AC
  Administered 2019-02-25: 12:00:00 12 mg via ORAL
  Filled 2019-02-25: qty 1

## 2019-02-25 MED ORDER — BUPIVACAINE HCL (PF) 0.25 % IJ SOLN
INTRAMUSCULAR | Status: AC
  Filled 2019-02-25: qty 30

## 2019-02-25 MED ORDER — KCL IN DEXTROSE-NACL 20-5-0.45 MEQ/L-%-% IV SOLN
INTRAVENOUS | Status: DC
  Administered 2019-02-25: 17:00:00 via INTRAVENOUS
  Filled 2019-02-25 (×3): qty 1000

## 2019-02-25 MED ORDER — MIDAZOLAM HCL 5 MG/5ML IJ SOLN
INTRAMUSCULAR | Status: DC | PRN
  Administered 2019-02-25: 2 mg via INTRAVENOUS

## 2019-02-25 MED ORDER — SCOPOLAMINE 1 MG/3DAYS TD PT72
1.0000 | MEDICATED_PATCH | TRANSDERMAL | Status: DC
  Administered 2019-02-25: 12:00:00 1.5 mg via TRANSDERMAL
  Filled 2019-02-25: qty 1

## 2019-02-25 MED ORDER — ONDANSETRON HCL 4 MG/2ML IJ SOLN: 4.0000 mg | Freq: Four times a day (QID) | INTRAMUSCULAR | Status: DC | PRN

## 2019-02-25 MED ORDER — SUGAMMADEX SODIUM 200 MG/2ML IV SOLN
INTRAVENOUS | Status: DC | PRN
  Administered 2019-02-25: 200 mg via INTRAVENOUS

## 2019-02-25 MED ORDER — SACCHAROMYCES BOULARDII 250 MG PO CAPS
250.0000 mg | ORAL_CAPSULE | Freq: Two times a day (BID) | ORAL | Status: DC
  Administered 2019-02-26 – 2019-02-27 (×3): 250 mg via ORAL
  Filled 2019-02-25 (×4): qty 1

## 2019-02-25 MED ORDER — ACETAMINOPHEN 500 MG PO TABS: 1000.0000 mg | ORAL_TABLET | ORAL | Status: DC

## 2019-02-25 MED ORDER — HYDROMORPHONE HCL 1 MG/ML IJ SOLN
0.5000 mg | INTRAMUSCULAR | Status: DC | PRN
  Administered 2019-02-25 – 2019-02-26 (×2): 0.5 mg via INTRAVENOUS
  Filled 2019-02-25 (×3): qty 0.5

## 2019-02-25 MED ORDER — SODIUM CHLORIDE 0.9 % IV SOLN
2.0000 g | INTRAVENOUS | Status: AC
  Administered 2019-02-25: 2 g via INTRAVENOUS
  Filled 2019-02-25: qty 2

## 2019-02-25 MED ORDER — GABAPENTIN 300 MG PO CAPS
300.0000 mg | ORAL_CAPSULE | ORAL | Status: AC
  Administered 2019-02-25: 300 mg via ORAL
  Filled 2019-02-25: qty 1

## 2019-02-25 MED ORDER — LIDOCAINE HCL 2 % IJ SOLN
INTRAMUSCULAR | Status: AC
  Filled 2019-02-25: qty 20

## 2019-02-25 MED ORDER — ACETAMINOPHEN 500 MG PO TABS
1000.0000 mg | ORAL_TABLET | Freq: Four times a day (QID) | ORAL | Status: DC
  Administered 2019-02-26 – 2019-02-27 (×5): 1000 mg via ORAL
  Filled 2019-02-25 (×5): qty 2

## 2019-02-25 MED ORDER — HYDROMORPHONE HCL 1 MG/ML IJ SOLN
0.2500 mg | INTRAMUSCULAR | Status: DC | PRN
  Administered 2019-02-25: 0.25 mg via INTRAVENOUS

## 2019-02-25 MED ORDER — OXYCODONE HCL 5 MG PO TABS: 5.0000 mg | ORAL_TABLET | Freq: Once | ORAL | Status: DC | PRN

## 2019-02-25 MED ORDER — LACTATED RINGERS IV SOLN
INTRAVENOUS | Status: DC
  Administered 2019-02-25 (×2): via INTRAVENOUS

## 2019-02-25 MED ORDER — PROPOFOL 10 MG/ML IV BOLUS
INTRAVENOUS | Status: AC
  Filled 2019-02-25: qty 20

## 2019-02-25 MED ORDER — ALVIMOPAN 12 MG PO CAPS
12.0000 mg | ORAL_CAPSULE | Freq: Two times a day (BID) | ORAL | Status: DC
  Filled 2019-02-25: qty 1

## 2019-02-25 MED ORDER — ROCURONIUM BROMIDE 10 MG/ML (PF) SYRINGE
PREFILLED_SYRINGE | INTRAVENOUS | Status: AC
  Filled 2019-02-25: qty 10

## 2019-02-25 MED ORDER — KETAMINE HCL 10 MG/ML IJ SOLN
INTRAMUSCULAR | Status: AC
  Filled 2019-02-25: qty 1

## 2019-02-25 MED ORDER — RINGERS IRRIGATION IR SOLN
Status: DC | PRN
  Administered 2019-02-25: 1000 mL

## 2019-02-25 MED ORDER — LIDOCAINE IN D5W 4-5 MG/ML-% IV SOLN
INTRAVENOUS | Status: DC | PRN
  Administered 2019-02-25: 1 ug/kg/min via INTRAVENOUS

## 2019-02-25 MED ORDER — ENOXAPARIN SODIUM 40 MG/0.4ML ~~LOC~~ SOLN
40.0000 mg | SUBCUTANEOUS | Status: DC
  Administered 2019-02-26 – 2019-02-27 (×2): 40 mg via SUBCUTANEOUS
  Filled 2019-02-25 (×2): qty 0.4

## 2019-02-25 MED ORDER — OXYCODONE HCL 5 MG/5ML PO SOLN: 5.0000 mg | Freq: Once | ORAL | Status: DC | PRN

## 2019-02-25 MED ORDER — MEPERIDINE HCL 50 MG/ML IJ SOLN: 6.2500 mg | INTRAMUSCULAR | Status: DC | PRN

## 2019-02-25 MED ORDER — ALUM & MAG HYDROXIDE-SIMETH 200-200-20 MG/5ML PO SUSP: 30.0000 mL | Freq: Four times a day (QID) | ORAL | Status: DC | PRN

## 2019-02-25 MED ORDER — HYDROMORPHONE HCL 1 MG/ML IJ SOLN
INTRAMUSCULAR | Status: AC
  Filled 2019-02-25: qty 1

## 2019-02-25 MED ORDER — HEPARIN SODIUM (PORCINE) 5000 UNIT/ML IJ SOLN
5000.0000 [IU] | Freq: Once | INTRAMUSCULAR | Status: AC
  Administered 2019-02-25: 12:00:00 5000 [IU] via SUBCUTANEOUS
  Filled 2019-02-25: qty 1

## 2019-02-25 MED ORDER — PROMETHAZINE HCL 25 MG/ML IJ SOLN: 6.2500 mg | INTRAMUSCULAR | Status: DC | PRN

## 2019-02-25 MED ORDER — DEXAMETHASONE SODIUM PHOSPHATE 10 MG/ML IJ SOLN
INTRAMUSCULAR | Status: DC | PRN
  Administered 2019-02-25: 10 mg via INTRAVENOUS

## 2019-02-25 MED ORDER — MIDAZOLAM HCL 2 MG/2ML IJ SOLN
INTRAMUSCULAR | Status: AC
  Filled 2019-02-25: qty 2

## 2019-02-25 MED ORDER — ONDANSETRON HCL 4 MG/2ML IJ SOLN
INTRAMUSCULAR | Status: AC
  Filled 2019-02-25: qty 2

## 2019-02-25 MED ORDER — FENTANYL CITRATE (PF) 100 MCG/2ML IJ SOLN
INTRAMUSCULAR | Status: AC
  Filled 2019-02-25: qty 2

## 2019-02-25 MED ORDER — LIDOCAINE 2% (20 MG/ML) 5 ML SYRINGE
INTRAMUSCULAR | Status: DC | PRN
  Administered 2019-02-25: 60 mg via INTRAVENOUS

## 2019-02-25 MED ORDER — ONDANSETRON HCL 4 MG/2ML IJ SOLN
INTRAMUSCULAR | Status: DC | PRN
  Administered 2019-02-25: 4 mg via INTRAVENOUS

## 2019-02-25 MED ORDER — ACETAMINOPHEN 500 MG PO TABS
1000.0000 mg | ORAL_TABLET | Freq: Once | ORAL | Status: AC
  Administered 2019-02-25: 12:00:00 1000 mg via ORAL
  Filled 2019-02-25: qty 2

## 2019-02-25 MED ORDER — ROCURONIUM BROMIDE 10 MG/ML (PF) SYRINGE
PREFILLED_SYRINGE | INTRAVENOUS | Status: DC | PRN
  Administered 2019-02-25: 50 mg via INTRAVENOUS
  Administered 2019-02-25 (×3): 10 mg via INTRAVENOUS

## 2019-02-25 MED ORDER — LIDOCAINE 2% (20 MG/ML) 5 ML SYRINGE
INTRAMUSCULAR | Status: AC
  Filled 2019-02-25: qty 5

## 2019-02-25 MED ORDER — SODIUM CHLORIDE 0.9 % IV SOLN
2.0000 g | Freq: Two times a day (BID) | INTRAVENOUS | Status: AC
  Administered 2019-02-26: 2 g via INTRAVENOUS
  Filled 2019-02-25 (×2): qty 2

## 2019-02-25 MED ORDER — PROPOFOL 10 MG/ML IV BOLUS
INTRAVENOUS | Status: DC | PRN
  Administered 2019-02-25: 150 mg via INTRAVENOUS

## 2019-02-25 MED ORDER — ONDANSETRON HCL 4 MG PO TABS: 4.0000 mg | ORAL_TABLET | Freq: Four times a day (QID) | ORAL | Status: DC | PRN

## 2019-02-25 MED ORDER — GABAPENTIN 300 MG PO CAPS
300.0000 mg | ORAL_CAPSULE | Freq: Two times a day (BID) | ORAL | Status: DC
  Administered 2019-02-26 – 2019-02-27 (×3): 300 mg via ORAL
  Filled 2019-02-25: qty 1
  Filled 2019-02-25 (×2): qty 3
  Filled 2019-02-25: qty 1

## 2019-02-25 MED ORDER — KETAMINE HCL 10 MG/ML IJ SOLN
INTRAMUSCULAR | Status: DC | PRN
  Administered 2019-02-25: 30 mg via INTRAVENOUS
  Administered 2019-02-25: 20 mg via INTRAVENOUS

## 2019-02-25 MED ORDER — ENSURE SURGERY PO LIQD
237.0000 mL | Freq: Two times a day (BID) | ORAL | Status: DC
  Administered 2019-02-26 – 2019-02-27 (×3): 237 mL via ORAL
  Filled 2019-02-25 (×4): qty 237

## 2019-02-25 SURGICAL SUPPLY — 102 items
ADH SKN CLS APL DERMABOND .7 (GAUZE/BANDAGES/DRESSINGS) ×1
BLADE EXTENDED COATED 6.5IN (ELECTRODE) IMPLANT
CANNULA REDUC XI 12-8 STAPL (CANNULA) ×1
CANNULA REDUCER 12-8 DVNC XI (CANNULA) ×1 IMPLANT
CELLS DAT CNTRL 66122 CELL SVR (MISCELLANEOUS) IMPLANT
CLIP VESOLOCK LG 6/CT PURPLE (CLIP) IMPLANT
CLIP VESOLOCK MED 6/CT (CLIP) IMPLANT
COVER SURGICAL LIGHT HANDLE (MISCELLANEOUS) ×4 IMPLANT
COVER TIP SHEARS 8 DVNC (MISCELLANEOUS) ×1 IMPLANT
COVER TIP SHEARS 8MM DA VINCI (MISCELLANEOUS) ×1
COVER WAND RF STERILE (DRAPES) ×2 IMPLANT
DECANTER SPIKE VIAL GLASS SM (MISCELLANEOUS) IMPLANT
DERMABOND ADVANCED (GAUZE/BANDAGES/DRESSINGS) ×1
DERMABOND ADVANCED .7 DNX12 (GAUZE/BANDAGES/DRESSINGS) IMPLANT
DRAIN CHANNEL 19F RND (DRAIN) IMPLANT
DRAPE ARM DVNC X/XI (DISPOSABLE) ×4 IMPLANT
DRAPE COLUMN DVNC XI (DISPOSABLE) ×1 IMPLANT
DRAPE DA VINCI XI ARM (DISPOSABLE) ×4
DRAPE DA VINCI XI COLUMN (DISPOSABLE) ×1
DRAPE SURG IRRIG POUCH 19X23 (DRAPES) ×2 IMPLANT
DRSG OPSITE POSTOP 4X10 (GAUZE/BANDAGES/DRESSINGS) IMPLANT
DRSG OPSITE POSTOP 4X6 (GAUZE/BANDAGES/DRESSINGS) ×1 IMPLANT
DRSG OPSITE POSTOP 4X8 (GAUZE/BANDAGES/DRESSINGS) IMPLANT
ELECT REM PT RETURN 15FT ADLT (MISCELLANEOUS) ×2 IMPLANT
ENDOLOOP SUT PDS II  0 18 (SUTURE)
ENDOLOOP SUT PDS II 0 18 (SUTURE) IMPLANT
EVACUATOR SILICONE 100CC (DRAIN) IMPLANT
GAUZE SPONGE 4X4 12PLY STRL (GAUZE/BANDAGES/DRESSINGS) IMPLANT
GLOVE BIO SURGEON STRL SZ 6.5 (GLOVE) ×6 IMPLANT
GLOVE BIOGEL PI IND STRL 7.0 (GLOVE) ×3 IMPLANT
GLOVE BIOGEL PI INDICATOR 7.0 (GLOVE) ×3
GOWN STRL REUS W/TWL XL LVL3 (GOWN DISPOSABLE) ×14 IMPLANT
GRASPER ENDOPATH ANVIL 10MM (MISCELLANEOUS) IMPLANT
GRASPER SUT TROCAR 14GX15 (MISCELLANEOUS) IMPLANT
HOLDER FOLEY CATH W/STRAP (MISCELLANEOUS) ×2 IMPLANT
IRRIG SUCT STRYKERFLOW 2 WTIP (MISCELLANEOUS) ×2
IRRIGATION SUCT STRKRFLW 2 WTP (MISCELLANEOUS) ×1 IMPLANT
IRRIGATOR SUCT 8 DISP DVNC XI (IRRIGATION / IRRIGATOR) IMPLANT
IRRIGATOR SUCTION 8MM XI DISP (IRRIGATION / IRRIGATOR)
KIT PROCEDURE DA VINCI SI (MISCELLANEOUS)
KIT PROCEDURE DVNC SI (MISCELLANEOUS) IMPLANT
KIT TURNOVER KIT A (KITS) IMPLANT
NDL INSUFFLATION 14GA 120MM (NEEDLE) ×1 IMPLANT
NEEDLE INSUFFLATION 14GA 120MM (NEEDLE) ×2 IMPLANT
PACK CARDIOVASCULAR III (CUSTOM PROCEDURE TRAY) ×2 IMPLANT
PACK COLON (CUSTOM PROCEDURE TRAY) ×2 IMPLANT
PENCIL SMOKE EVACUATOR (MISCELLANEOUS) IMPLANT
PORT LAP GEL ALEXIS MED 5-9CM (MISCELLANEOUS) ×2 IMPLANT
RELOAD STAPLE 45 BLU REG DVNC (STAPLE) IMPLANT
RELOAD STAPLE 45 GRN THCK DVNC (STAPLE) IMPLANT
RELOAD STAPLE 60 4.3 GRN DVNC (STAPLE) IMPLANT
RELOAD STAPLER 4.3X60 GRN DVNC (STAPLE) ×1 IMPLANT
RETRACTOR WND ALEXIS 18 MED (MISCELLANEOUS) IMPLANT
RTRCTR WOUND ALEXIS 18CM MED (MISCELLANEOUS)
SCISSORS LAP 5X35 DISP (ENDOMECHANICALS) ×2 IMPLANT
SEAL CANN UNIV 5-8 DVNC XI (MISCELLANEOUS) ×3 IMPLANT
SEAL XI 5MM-8MM UNIVERSAL (MISCELLANEOUS) ×4
SEALER VESSEL DA VINCI XI (MISCELLANEOUS) ×1
SEALER VESSEL EXT DVNC XI (MISCELLANEOUS) ×1 IMPLANT
SLEEVE ADV FIXATION 5X100MM (TROCAR) IMPLANT
SOLUTION ELECTROLUBE (MISCELLANEOUS) ×2 IMPLANT
STAPLER 45 BLU RELOAD XI (STAPLE) IMPLANT
STAPLER 45 BLUE RELOAD XI (STAPLE)
STAPLER 45 GREEN RELOAD XI (STAPLE)
STAPLER 45 GRN RELOAD XI (STAPLE) IMPLANT
STAPLER 60 DA VINCI SURE FORM (STAPLE) ×1
STAPLER 60 SUREFORM DVNC (STAPLE) IMPLANT
STAPLER CANNULA SEAL DVNC XI (STAPLE) ×1 IMPLANT
STAPLER CANNULA SEAL XI (STAPLE) ×1
STAPLER ECHELON POWER CIR 29 (STAPLE) ×1 IMPLANT
STAPLER RELOAD 4.3X60 GREEN (STAPLE) ×1
STAPLER RELOAD 4.3X60 GRN DVNC (STAPLE) ×1
STAPLER SHEATH (SHEATH)
STAPLER SHEATH ENDOWRIST DVNC (SHEATH) ×1 IMPLANT
STAPLER VISISTAT 35W (STAPLE) IMPLANT
STOPCOCK 4 WAY LG BORE MALE ST (IV SETS) ×4 IMPLANT
SUT ETHILON 2 0 PS N (SUTURE) IMPLANT
SUT NOVA NAB DX-16 0-1 5-0 T12 (SUTURE) ×4 IMPLANT
SUT PROLENE 2 0 KS (SUTURE) ×2 IMPLANT
SUT SILK 2 0 (SUTURE) ×2
SUT SILK 2 0 SH CR/8 (SUTURE) IMPLANT
SUT SILK 2-0 18XBRD TIE 12 (SUTURE) ×1 IMPLANT
SUT SILK 3 0 (SUTURE)
SUT SILK 3 0 SH CR/8 (SUTURE) ×2 IMPLANT
SUT SILK 3-0 18XBRD TIE 12 (SUTURE) IMPLANT
SUT V-LOC BARB 180 2/0GR6 GS22 (SUTURE)
SUT VIC AB 2-0 SH 18 (SUTURE) IMPLANT
SUT VIC AB 2-0 SH 27 (SUTURE) ×4
SUT VIC AB 2-0 SH 27X BRD (SUTURE) ×1 IMPLANT
SUT VIC AB 3-0 SH 18 (SUTURE) IMPLANT
SUT VIC AB 4-0 PS2 27 (SUTURE) ×5 IMPLANT
SUT VICRYL 0 UR6 27IN ABS (SUTURE) ×2 IMPLANT
SUTURE V-LC BRB 180 2/0GR6GS22 (SUTURE) IMPLANT
SYR 10ML ECCENTRIC (SYRINGE) ×2 IMPLANT
SYS LAPSCP GELPORT 120MM (MISCELLANEOUS)
SYSTEM LAPSCP GELPORT 120MM (MISCELLANEOUS) IMPLANT
TOWEL OR 17X26 10 PK STRL BLUE (TOWEL DISPOSABLE) ×1 IMPLANT
TOWEL OR NON WOVEN STRL DISP B (DISPOSABLE) ×2 IMPLANT
TRAY FOLEY MTR SLVR 16FR STAT (SET/KITS/TRAYS/PACK) ×2 IMPLANT
TROCAR ADV FIXATION 5X100MM (TROCAR) ×2 IMPLANT
TUBING CONNECTING 10 (TUBING) ×4 IMPLANT
TUBING INSUFFLATION 10FT LAP (TUBING) ×2 IMPLANT

## 2019-02-25 NOTE — Anesthesia Procedure Notes (Signed)
Procedure Name: Intubation Date/Time: 02/25/2019 12:54 PM Performed by: Gerald Leitz, CRNA Pre-anesthesia Checklist: Patient identified, Patient being monitored, Timeout performed, Emergency Drugs available and Suction available Patient Re-evaluated:Patient Re-evaluated prior to induction Oxygen Delivery Method: Circle system utilized Preoxygenation: Pre-oxygenation with 100% oxygen Induction Type: IV induction Ventilation: Mask ventilation without difficulty Laryngoscope Size: Mac and 3 Grade View: Grade I Tube type: Oral Tube size: 7.0 mm Number of attempts: 1 Placement Confirmation: ETT inserted through vocal cords under direct vision,  positive ETCO2 and breath sounds checked- equal and bilateral Secured at: 21 cm Tube secured with: Tape Dental Injury: Teeth and Oropharynx as per pre-operative assessment

## 2019-02-25 NOTE — Anesthesia Postprocedure Evaluation (Signed)
Anesthesia Post Note  Patient: Sydney Alvarado  Procedure(s) Performed: XI ROBOT ASSISTED SIGMOID COLECTOMY, RIGID PROCTOSCOPY (N/A Abdomen)     Patient location during evaluation: PACU Anesthesia Type: General Level of consciousness: awake and alert, oriented and patient cooperative Pain management: pain level controlled Vital Signs Assessment: post-procedure vital signs reviewed and stable Respiratory status: spontaneous breathing, nonlabored ventilation and respiratory function stable Cardiovascular status: blood pressure returned to baseline and stable Postop Assessment: no apparent nausea or vomiting Anesthetic complications: no    Last Vitals:  Vitals:   02/25/19 1615 02/25/19 1655  BP: (!) 145/75 137/79  Pulse: 63 67  Resp: 19 14  Temp: (!) 36.3 C 36.8 C  SpO2: 97% 100%    Last Pain:  Vitals:   02/25/19 1655  TempSrc: Oral  PainSc:                  Pervis Hocking

## 2019-02-25 NOTE — H&P (Signed)
The patient is a 53 year old female who presents with diverticulitis. 53 year old female who presents to the office with complaints of diverticulitis. Last May she developed an episode of left lower quadrant pain while on vacation. She was seen in urgent care and diagnosed clinically with diverticulitis. She was placed on antibiotics and her symptoms resolved. She did undergo a colonoscopy with Dr. Collene Mares afterwards that showed diverticulosis and 2 polyps. Follow-up colonoscopy was recommended in 5 years. At this time, she started a fiber supplement and increased her water intake. She was having good bowel function. This year she has had multiple orthopedic injuries requiring surgery. After one of these injuries, she developed some constipation due to narcotics. She then developed left lower quadrant pain. She presented to the emergency department and was noted to have diverticulitis. She was placed on IV antibiotics and her symptoms resolved. She is having no further pain or fevers. She is having regular bowel function.   Past Surgical History Nance Pew, [place]; 11/15/2018 11:28 AM) Cesarean Section - Multiple  Colon Polyp Removal - Colonoscopy  Foot Surgery  Bilateral. Oral Surgery   Diagnostic Studies History Nance Pew, CMA; 11/15/2018 11:28 AM) Colonoscopy  1-5 years ago Mammogram  within last year Pap Smear  1-5 years ago  Allergies Nance Pew, CMA; 11/15/2018 11:29 AM) Sulfa Drugs  Allergies Reconciled   Medication History (Sabrina Canty, CMA; 11/15/2018 11:30 AM) traMADol HCl (50MG  Tablet, Oral) Active. Cephalexin (500MG  Capsule, Oral) Active. [employer] Digestive Probiotic (250MG  Capsule, Oral) Active. Diclofenac Sodium (50MG  Tablet DR, Oral) Active. Ondansetron HCl (4MG  Tablet, Oral) Active. Calcium Antacid (500MG  Tablet Chewable, Oral) Active. Medications Reconciled  Social History Nance Pew, CMA; 11/15/2018 11:28 AM) Caffeine use   Coffee, Tea. No alcohol use  No drug use  Tobacco use  Never smoker.  Family History Nance Pew, [place]; 11/15/2018 11:28 AM) Diabetes Mellitus  Mother. Heart Disease  Father. Thyroid problems  Mother.  Pregnancy / Birth History Nance Pew, [place]; 11/15/2018 11:28 AM) Age at menarche  15 years. Contraceptive History  Oral contraceptives. Gravida  4 Irregular periods  Length (months) of breastfeeding  7-12 Maternal age  60-25 Para  2  Other Problems Nance Pew, CMA; 11/15/2018 11:28 AM) Diverticulosis  Gastroesophageal Reflux Disease  General anesthesia - complications  Hemorrhoids     Review of Systems  General Present- Appetite Loss and Fatigue. Not Present- Chills, Fever, Night Sweats, Weight Gain and Weight Loss. Skin Not Present- Change in Wart/Mole, Dryness, Hives, Jaundice, New Lesions, Non-Healing Wounds, Rash and Ulcer. HEENT Present- Wears glasses/contact lenses. Not Present- Earache, Hearing Loss, Hoarseness, Nose Bleed, Oral Ulcers, Ringing in the Ears, Seasonal Allergies, Sinus Pain, Sore Throat, Visual Disturbances and Yellow Eyes. Respiratory Present- Snoring. Not Present- Bloody sputum, Chronic Cough, Difficulty Breathing and Wheezing. Breast Not Present- Breast Mass, Breast Pain, Nipple Discharge and Skin Changes. Cardiovascular Not Present- Chest Pain, Difficulty Breathing Lying Down, Leg Cramps, Palpitations, Rapid Heart Rate, Shortness of Breath and Swelling of Extremities. Gastrointestinal Present- Hemorrhoids and Indigestion. Not Present- Abdominal Pain, Bloating, Bloody Stool, Change in Bowel Habits, Chronic diarrhea, Constipation, Difficulty Swallowing, Excessive gas, Gets full quickly at meals, Nausea, Rectal Pain and Vomiting. Female Genitourinary Not Present- Frequency, Nocturia, Painful Urination, Pelvic Pain and Urgency. Musculoskeletal Not Present- Back Pain, Joint Pain, Joint Stiffness, Muscle Pain, Muscle Weakness and  Swelling of Extremities. Neurological Not Present- Decreased Memory, Fainting, Headaches, Numbness, Seizures, Tingling, Tremor, Trouble walking and Weakness. Psychiatric Not Present- Anxiety, Bipolar, Change in Sleep Pattern, Depression, Fearful and  Frequent crying. Endocrine Not Present- Cold Intolerance, Excessive Hunger, Hair Changes, Heat Intolerance, Hot flashes and New Diabetes. Hematology Not Present- Blood Thinners, Easy Bruising, Excessive bleeding, Gland problems, HIV and Persistent Infections.  BP 116/79   Pulse 61   Temp 98.3 F (36.8 C)   Resp 15   SpO2 97%     Physical Exam  General Mental Status-Alert. General Appearance-Not in acute distress. Build & Nutrition-Well nourished. Posture-Normal posture. Gait-Normal.  Head and Neck Head-normocephalic, atraumatic with no lesions or palpable masses. Trachea-midline.  Chest and Lung Exam Chest and lung exam reveals -on auscultation, normal breath sounds, no adventitious sounds and normal vocal resonance.  Cardiovascular Cardiovascular examination reveals -normal heart sounds, regular rate and rhythm with no murmurs and no digital clubbing, cyanosis, edema, increased warmth or tenderness.  Abdomen Inspection Inspection of the abdomen reveals - No Hernias. Palpation/Percussion Palpation and Percussion of the abdomen reveal - Soft, Non Tender, No Rigidity (guarding), No hepatosplenomegaly and No Palpable abdominal masses.  Neurologic Neurologic evaluation reveals -alert and oriented x 3 with no impairment of recent or remote memory, normal attention span and ability to concentrate, normal sensation and normal coordination.  Musculoskeletal Normal Exam - Bilateral-Upper Extremity Strength Normal and Lower Extremity Strength Normal.    Assessment & Plan   DIVERTICULITIS [id number]) Impression: 53 year old female who presents to the office after recent hospitalization for  diverticulitis in early August 2020. This occurred after a bout of constipation due to narcotics from orthopedic surgery. She has had one other presumed episode of diverticulitis but no imaging or lab work was performed. Colonoscopy was performed approximately 1 year ago and was significant for diverticulosis and 2 colon polyps. We discussed the role for sigmoidectomy in the setting of recurrent diverticulitis. I do not see any definitive reasons that she absolutely has to have surgery, but I recommended that she consider sigmoidectomy to reduce her risk of further episodes in the future. The surgery and anatomy were described to the patient as well as the risks of surgery and the possible complications.  These include: Bleeding, deep abdominal infections and possible wound complications such as hernia and infection, damage to adjacent structures, leak of surgical connections, which can lead to other surgeries and possibly an ostomy, possible need for other procedures, such as abscess drains in radiology, possible prolonged hospital stay, possible diarrhea from removal of part of the colon, possible constipation from narcotics, prolonged fatigue/weakness or appetite loss, possible early recurrence of of disease, possible complications of their medical problems such as heart disease or arrhythmias or lung problems, death (less than 1%). I believe the patient understands and wishes to proceed with the surgery.

## 2019-02-25 NOTE — Transfer of Care (Signed)
Immediate Anesthesia Transfer of Care Note  Patient: Sydney Alvarado  Procedure(s) Performed: XI ROBOT ASSISTED SIGMOID COLECTOMY (N/A Abdomen)  Patient Location: PACU  Anesthesia Type:General  Level of Consciousness: awake, alert  and oriented  Airway & Oxygen Therapy: Patient Spontanous Breathing and Patient connected to face mask oxygen  Post-op Assessment: Report given to RN and Post -op Vital signs reviewed and stable  Post vital signs: Reviewed and stable  Last Vitals:  Vitals Value Taken Time  BP    Temp    Pulse 83 02/25/19 1522  Resp 23 02/25/19 1522  SpO2 97 % 02/25/19 1522  Vitals shown include unvalidated device data.  Last Pain: There were no vitals filed for this visit.       Complications: No apparent anesthesia complications

## 2019-02-25 NOTE — Op Note (Signed)
02/25/2019  3:01 PM  PATIENT:  Sydney Alvarado  53 y.o. female  Patient Care Team: Newt Minion, MD as Attending Physician (Orthopedic Surgery) Paula Compton, MD as Attending Physician (Obstetrics and Gynecology)  PRE-OPERATIVE DIAGNOSIS:  DIVERTICULAR DISEASE  POST-OPERATIVE DIAGNOSIS:  DIVERTICULAR DISEASE  PROCEDURE:  XI ROBOT ASSISTED SIGMOIDECTOMY    Surgeon(s): Leighton Ruff, MD Clovis Riley, MD  ASSISTANT: Dr Kae Heller   ANESTHESIA:   local and general  EBL: 25mg  Total I/O In: 1100 [I.V.:1000; IV Piggyback:100] Out: 75 [Urine:50; Blood:25]  Delay start of Pharmacological VTE agent (>24hrs) due to surgical blood loss or risk of bleeding:  no  DRAINS: none   SPECIMEN:  Source of Specimen:  Sigmoid colon  DISPOSITION OF SPECIMEN:  PATHOLOGY  COUNTS:  YES  PLAN OF CARE: Admit to inpatient   PATIENT DISPOSITION:  PACU - hemodynamically stable.  INDICATION:    53 y.o. F with severe diverticulitis requiring hospitalization.  I recommended segmental resection to help prevent future occurrences:  The anatomy & physiology of the digestive tract was discussed.  The pathophysiology was discussed.  Natural history risks without surgery was discussed.   I worked to give an overview of the disease and the frequent need to have multispecialty involvement.  I feel the risks of no intervention will lead to serious problems that outweigh the operative risks; therefore, I recommended a partial colectomy to remove the pathology.  Laparoscopic & open techniques were discussed.   Risks such as bleeding, infection, abscess, leak, reoperation, possible ostomy, hernia, heart attack, death, and other risks were discussed.  I noted a good likelihood this will help address the problem.   Goals of post-operative recovery were discussed as well.    The patient expressed understanding & wished to proceed with surgery.  OR FINDINGS:   Patient had significant diverticular disease  within her sigmoid colon  The anastomosis rests 14 cm from the anal verge by rigid proctoscopy.  DESCRIPTION:   Informed consent was confirmed.  The patient underwent general anaesthesia without difficulty.  The patient was positioned appropriately.  VTE prevention in place.  The patient's abdomen was clipped, prepped, & draped in a sterile fashion.  Surgical timeout confirmed our plan.  The patient was positioned in reverse Trendelenburg.  Abdominal entry was gained using a Varies needle.  Entry was clean.  I induced carbon dioxide insufflation.  An 82mm robotic port was placed in the RUQ.  Camera inspection revealed no injury.  Extra ports were carefully placed under direct laparoscopic visualization.  I laparoscopically reflected the greater omentum and the upper abdomen the small bowel in the upper abdomen. The patient was appropriately positioned and the robot was docked to the patient's left side.  Instruments were placed under direct visualization.    I mobilized the sigmoid colon off of the pelvic sidewall.  I scored the base of peritoneum of the right side of the mesentery of the left colon from the ligament of Treitz to the peritoneal reflection of the mid rectum.  I elevated the sigmoid mesentery and enetered into the retro-mesenteric plane. We were able to identify the left ureter and gonadal vessels. We kept those posterior within the retroperitoneum and elevated the left colon mesentery off that. I did isolated IMA pedicle but did not ligate it yet.  I continued distally and got into the avascular plane posterior to the mesorectum. This allowed me to help mobilize the rectum as well by freeing the mesorectum off the sacrum.  I  mobilized the peritoneal coverings towards the peritoneal reflection on both the right and left sides of the rectum.  I could see the right and left ureters and stayed away from them.    I skeletonized the inferior mesenteric artery pedicle.  After confirming the left  ureter was out of the way, I went ahead and ligated the inferior mesenteric artery pedicle with bipolar robotic vessel sealer well above its takeoff from the aorta.   We ensured hemostasis. I skeletonized the mesorectum at the junction at the proximal rectum using blunt dissection & bipolar robotic vessel sealer.  I mobilized the left colon in a lateral to medial fashion off the line of Toldt up towards the splenic flexure to ensure good mobilization of the left colon to reach into the pelvis.  I then divided the rectosigmoid junction using a green load robotic stapler.  I divided the mesentery with the vessel sealer to the level of the distal descending colon.  There was good bleeding noted within the mesenteric edge at this point.  The robot was then undocked and a Pfannenstiel incision was made through the previous suprapubic 12 mm port site.  An [place] wound protector was placed.  The colon was removed from the abdominal cavity and a pursestring device was placed on the previously dissected distal descending colon.  A 2-0 Prolene pursestring suture was applied.  The colon was transected and the specimen was sent to pathology for further examination.  The Prolene pursestring suture was secured with 3-0 silk sutures.  There was 1 bleeding vessel at the mesenteric edge which was also secured with a 3-0 silk suture.  A 29 mm EEA anvil was then placed into the colon and the pursestring was tied tightly around this.  The platform of the anvil was cleared of fat and then placed back into the abdomen.  The Ubaldo Glassing was capped and the abdomen was reinsufflated.  An EEA stapler was introduced into the rectum and brought out through the distal rectal stump.  An anastomosis was created without difficulty.  There was no tension noted.  There was no twisting of the colon.  There was no leak when tested with insufflation under irrigation.  The staple line appeared hemostatic on rigid sigmoidoscopy.  The anastomosis rest  approximately 14 cm from the anal verge.  The abdomen was irrigated.  Hemostasis was good.  The omentum was brought down over the abdominal contents and the abdomen was desufflated.  We then switched to clean gowns, gloves, instruments and drapes.  The Pfannenstiel incision was closed using a 2-0 Vicryl running suture to close the peritoneum.  The fascia was closed using interrupted #1 Novafil sutures.  The subcutaneous tissue was reapproximated using a running 2-0 Vicryl suture.  The skin was closed using a running 4-0 Vicryl subcuticular suture.  A sterile dressing was applied.  The remaining port sites were also closed using interrupted 4-0 Vicryl sutures and Dermabond.  The patient was then awakened from anesthesia and sent to the postanesthesia care unit in stable condition.  All counts were correct per operating room staff.  An MD assistant was necessary for tissue manipulation, retraction and positioning due to the complexity of the case and hospital policies

## 2019-02-26 LAB — CBC
HCT: 37.6 % (ref 36.0–46.0)
Hemoglobin: 11.8 g/dL — ABNORMAL LOW (ref 12.0–15.0)
MCH: 28.8 pg (ref 26.0–34.0)
MCHC: 31.4 g/dL (ref 30.0–36.0)
MCV: 91.7 fL (ref 80.0–100.0)
Platelets: 242 10*3/uL (ref 150–400)
RBC: 4.1 MIL/uL (ref 3.87–5.11)
RDW: 13.2 % (ref 11.5–15.5)
WBC: 14.3 10*3/uL — ABNORMAL HIGH (ref 4.0–10.5)
nRBC: 0 % (ref 0.0–0.2)

## 2019-02-26 LAB — BASIC METABOLIC PANEL
Anion gap: 9 (ref 5–15)
BUN: <5 mg/dL — ABNORMAL LOW (ref 6–20)
CO2: 23 mmol/L (ref 22–32)
Calcium: 9 mg/dL (ref 8.9–10.3)
Chloride: 106 mmol/L (ref 98–111)
Creatinine, Ser: 0.79 mg/dL (ref 0.44–1.00)
GFR calc Af Amer: >60 mL/min (ref >60)
GFR calc non Af Amer: >60 mL/min (ref >60)
Glucose, Bld: 156 mg/dL — ABNORMAL HIGH (ref 70–99)
Potassium: 4.2 mmol/L (ref 3.5–5.1)
Sodium: 138 mmol/L (ref 135–145)

## 2019-02-26 MED ORDER — TRAMADOL HCL 50 MG PO TABS
50.0000 mg | ORAL_TABLET | Freq: Four times a day (QID) | ORAL | Status: DC | PRN
  Administered 2019-02-26 – 2019-02-27 (×3): 100 mg via ORAL
  Filled 2019-02-26 (×4): qty 2

## 2019-02-26 NOTE — Progress Notes (Signed)
Pharmacy Brief Note - Alvimopan (Entereg)  The standing order set for alvimopan (Entereg) now includes an automatic order to discontinue the drug after the patient has had a bowel movement. The change was approved by the [place] and the Medical Executive Committee.   This patient has had bowel movements documented by nursing. Therefore, alvimopan has been discontinued. If there are questions, please contact the pharmacy at [phone].   Thank you-  Royetta Asal, PharmD, BCPS 02/26/2019 1:26 PM

## 2019-02-26 NOTE — Progress Notes (Signed)
1 Day Post-Op robotic sigmoidectomy Subjective: Doing well, having BM's.  Tolerating liquids.  Feels sore with movement  Objective: Vital signs in last 24 hours: Temp:  [97.3 F (36.3 C)-98.5 F (36.9 C)] 98.2 F (36.8 C) (12/12 0629) Pulse Rate:  [59-83] 59 (12/12 0629) Resp:  [14-23] 14 (12/12 0629) BP: (106-145)/(55-89) 106/62 (12/12 0629) SpO2:  [94 %-100 %] 95 % (12/12 0629) Weight:  [80.7 kg-89 kg] 89 kg (12/12 0715)   Intake/Output from previous day: 12/11 0701 - 12/12 0700 In: 1448.1 [I.V.:1348.1; IV Piggyback:100] Out: [id number] [Urine:1700; Blood:25] Intake/Output this shift: Total I/O In: 360 [P.O.:360] Out: 800 [Urine:800]   General appearance: alert and cooperative GI: soft, non-distended  Incision: no significant drainage  Lab Results:  Recent Labs    02/26/19 0454  WBC 14.3*  HGB 11.8*  HCT 37.6  PLT 242   BMET Recent Labs    02/26/19 0454  NA 138  K 4.2  CL 106  CO2 23  GLUCOSE 156*  BUN <5*  CREATININE 0.79  CALCIUM 9.0   PT/INR No results for input(s): LABPROT, INR in the last 72 hours. ABG No results for input(s): PHART, HCO3 in the last 72 hours.  Invalid input(s): PCO2, PO2  MEDS, Scheduled . acetaminophen  1,000 mg Oral Q6H  . alvimopan  12 mg Oral BID  . enoxaparin (LOVENOX) injection  40 mg Subcutaneous Q24H  . feeding supplement  237 mL Oral BID BM  . gabapentin  300 mg Oral BID  . saccharomyces boulardii  250 mg Oral BID    Studies/Results: No results found.  Assessment: s/p Procedure(s): XI ROBOT ASSISTED SIGMOID COLECTOMY, RIGID PROCTOSCOPY Patient Active Problem List   Diagnosis Date Noted  . Diverticular disease 02/25/2019  . Diverticulitis of large intestine with perforation without abscess 10/26/2018  . Abdominal pain 10/25/2018  . Acute diverticulitis 10/25/2018  . Leukocytosis 10/25/2018    Expected post op course  Plan: d/c foley Advance diet  SLIV Ambulate in hall Possible d/c tom  Ok to  shower   LOS: 1 day     .Rosario Adie, MD [facility], [place]    02/26/2019 9:31 AM

## 2019-02-27 LAB — CBC
HCT: 35.2 % — ABNORMAL LOW (ref 36.0–46.0)
Hemoglobin: 10.9 g/dL — ABNORMAL LOW (ref 12.0–15.0)
MCH: 28.8 pg (ref 26.0–34.0)
MCHC: 31 g/dL (ref 30.0–36.0)
MCV: 92.9 fL (ref 80.0–100.0)
Platelets: 194 10*3/uL (ref 150–400)
RBC: 3.79 MIL/uL — ABNORMAL LOW (ref 3.87–5.11)
RDW: 13.4 % (ref 11.5–15.5)
WBC: 11.9 10*3/uL — ABNORMAL HIGH (ref 4.0–10.5)
nRBC: 0 % (ref 0.0–0.2)

## 2019-02-27 LAB — BASIC METABOLIC PANEL
Anion gap: 8 (ref 5–15)
BUN: 8 mg/dL (ref 6–20)
CO2: 26 mmol/L (ref 22–32)
Calcium: 8.7 mg/dL — ABNORMAL LOW (ref 8.9–10.3)
Chloride: 104 mmol/L (ref 98–111)
Creatinine, Ser: 0.74 mg/dL (ref 0.44–1.00)
GFR calc Af Amer: >60 mL/min (ref >60)
GFR calc non Af Amer: >60 mL/min (ref >60)
Glucose, Bld: 92 mg/dL (ref 70–99)
Potassium: 4.3 mmol/L (ref 3.5–5.1)
Sodium: 138 mmol/L (ref 135–145)

## 2019-02-27 MED ORDER — TRAMADOL HCL 50 MG PO TABS: 50.0000 mg | ORAL_TABLET | Freq: Four times a day (QID) | ORAL | 0 refills | Status: DC | PRN

## 2019-02-27 NOTE — Discharge Summary (Signed)
Physician Discharge Summary  Patient ID: Sydney Alvarado MRN: [id number] DOB/AGE: 1965/04/19 53 y.o.  Admit date: 02/25/2019 Discharge date: 02/27/2019  Admission Diagnoses: Diverticular disease  Discharge Diagnoses:  Active Problems:   Diverticular disease   Discharged Condition: good  Hospital Course: Pt admitted after robotic sigmoidectomy.  Diet was advanced as tolerated.  She was ready for discharge to home on POD 2.  Consults: None  Significant Diagnostic Studies: labs: cbc, bmet  Treatments: IV hydration, analgesia: acetaminophen and tramadol and surgery: robotic sigmoidectomy  Discharge Exam: Blood pressure 110/65, pulse (!) 45, temperature 98.3 F (36.8 C), temperature source Oral, resp. rate 14, height 5\' 4"  (1.626 m), weight 89 kg, SpO2 93 %. General appearance: alert and cooperative GI: normal findings: soft, non-tender Incision/Wound: clean, dry, intact  Disposition: home   Allergies as of 02/27/2019      Reactions   Sulfa Antibiotics Hives      Medication List    TAKE these medications   calcium carbonate 750 MG chewable tablet Commonly known as: TUMS EX Chew 2 tablets by mouth daily as needed for heartburn.   Calcium-Vitamin D 600-400 MG-UNIT Tabs Take 2 tablets by mouth every morning.   Multivitamin Gummies Womens Google 2 each by mouth daily.   PROBIOTIC DAILY PO Take 1 capsule by mouth daily.   traMADol 50 MG tablet Commonly known as: ULTRAM Take 1-2 tablets (50-100 mg total) by mouth every 6 (six) hours as needed (mild pain).   Vitamin D 125 MCG (5000 UT) Caps Take 5,000 Units by mouth daily.        Signed: Rosario Adie [id number], 9:29 AM

## 2019-02-27 NOTE — Discharge Instructions (Signed)
ABDOMINAL SURGERY: POST OP INSTRUCTIONS  1. DIET: Follow a light bland diet the first 24 hours after arrival home, such as soup, liquids, crackers, etc.  Be sure to include lots of fluids daily.  Avoid fast food or heavy meals as your are more likely to get nauseated.  Do not eat any uncooked fruits or vegetables for the next 2 weeks as your colon heals. 2. Take your usually prescribed home medications unless otherwise directed. 3. PAIN CONTROL: a. Pain is best controlled by a usual combination of three different methods TOGETHER: i. Ice/Heat ii. Over the counter pain medication iii. Prescription pain medication b. Most patients will experience some swelling and bruising around the incisions.  Ice packs or heating pads (30-60 minutes up to 6 times a day) will help. Use ice for the first few days to help decrease swelling and bruising, then switch to heat to help relax tight/sore spots and speed recovery.  Some people prefer to use ice alone, heat alone, alternating between ice & heat.  Experiment to what works for you.  Swelling and bruising can take several weeks to resolve.   c. It is helpful to take an over-the-counter pain medication regularly for the first few weeks.  Choose one of the following that works best for you: i. Naproxen (Aleve, etc)  Two 220mg  tabs twice a day ii. Ibuprofen (Advil, etc) Three 200mg  tabs four times a day (every meal & bedtime) iii. Acetaminophen (Tylenol, etc) 500-650mg  four times a day (every meal & bedtime) d. A  prescription for pain medication (such as oxycodone, hydrocodone, etc) should be given to you upon discharge.  Take your pain medication as prescribed.  i. If you are having problems/concerns with the prescription medicine (does not control pain, nausea, vomiting, rash, itching, etc), please call us [phone] to see if we need to switch you to a different pain medicine that will work better for you and/or control your side effect better. ii. If you  need a refill on your pain medication, please contact your pharmacy.  They will contact our office to request authorization. Prescriptions will not be filled after 5 pm or on week-ends. 4. Avoid getting constipated.  Between the surgery and the pain medications, it is common to experience some constipation.  Increasing fluid intake and taking a fiber supplement (such as Metamucil, Citrucel, FiberCon, MiraLax, etc) 1-2 times a day regularly will usually help prevent this problem from occurring.  A mild laxative (prune juice, Milk of Magnesia, MiraLax, etc) should be taken according to package directions if there are no bowel movements after 48 hours.   5. Watch out for diarrhea.  If you have many loose bowel movements, simplify your diet to bland foods & liquids for a few days.  Stop any stool softeners and decrease your fiber supplement.  Switching to mild anti-diarrheal medications (Kayopectate, Pepto Bismol) can help.  If this worsens or does not improve, please call us. 6. Wash / shower every day.  You may shower over the incision / wound.  Avoid baths until the skin is fully healed.  Continue to shower over incision(s) after the dressing is off. 7. Remove your waterproof bandages 3 days after surgery (12/14).  You may leave the incision open to air.  You may replace a dressing/Band-Aid to cover the incision for comfort if you wish. 8. ACTIVITIES as tolerated:   a. You may resume regular (light) daily activities beginning the next day--such as daily self-care, walking, climbing stairs--gradually increasing activities  as tolerated.  If you can walk 30 minutes without difficulty, it is safe to try more intense activity such as jogging, treadmill, bicycling, low-impact aerobics, swimming, etc. b. Save the most intensive and strenuous activity for last such as sit-ups, heavy lifting, contact sports, etc  Refrain from any heavy lifting or straining until you are off narcotics for pain control.   c. DO NOT PUSH  THROUGH PAIN.  Let pain be your guide: If it hurts to do something, don't do it.  Pain is your body warning you to avoid that activity for another week until the pain goes down. d. You may drive when you are no longer taking prescription pain medication, you can comfortably wear a seatbelt, and you can safely maneuver your car and apply brakes. e. Dennis Bast may have sexual intercourse when it is comfortable.  9. FOLLOW UP in our office a. Please call [facility] at [phone] to set up an appointment to see your surgeon in the office for a follow-up appointment approximately 1-2 weeks after your surgery. b. Make sure that you call for this appointment the day you arrive home to insure a convenient appointment time. 10. IF YOU HAVE DISABILITY OR FAMILY LEAVE FORMS, BRING THEM TO THE OFFICE FOR PROCESSING.  DO NOT GIVE THEM TO YOUR DOCTOR.   WHEN TO CALL us [phone]: 1. Poor pain control 2. Reactions / problems with new medications (rash/itching, nausea, etc)  3. Fever over 101.5 F (38.5 C) 4. Inability to urinate 5. Nausea and/or vomiting 6. Worsening swelling or bruising 7. Continued bleeding from incision. 8. Increased pain, redness, or drainage from the incision  The clinic staff is available to answer your questions during regular business hours (8:30am-5pm).  Please don't hesitate to call and ask to speak to one of our nurses for clinical concerns.   A surgeon from [facility] is always on call at the hospitals   If you have a medical emergency, go to the nearest emergency room or call 911.    [facility], [place], [place], [place], [place]  [address] ? MAIN: [phone] ? TOLL FREE: [phone] ? FAX [phone] [email or web address]

## 2019-02-27 NOTE — Plan of Care (Signed)
Pt ready for DC home 

## 2019-02-28 LAB — SURGICAL PATHOLOGY

## 2019-03-22 ENCOUNTER — Other Ambulatory Visit: Payer: Self-pay | Admitting: Gastroenterology

## 2019-03-22 DIAGNOSIS — K769 Liver disease, unspecified: Secondary | ICD-10-CM

## 2019-04-13 ENCOUNTER — Other Ambulatory Visit: Payer: Self-pay | Admitting: Gastroenterology

## 2019-04-14 ENCOUNTER — Other Ambulatory Visit: Payer: Self-pay | Admitting: Gastroenterology

## 2019-04-16 ENCOUNTER — Ambulatory Visit
Admission: RE | Admit: 2019-04-16 | Discharge: 2019-04-16 | Disposition: A | Discharge status: Home / Self Care | Payer: Managed Care, Other (non HMO) | Source: Ambulatory Visit | Attending: Gastroenterology | Admitting: Gastroenterology

## 2019-04-16 ENCOUNTER — Other Ambulatory Visit: Payer: Self-pay

## 2019-04-16 DIAGNOSIS — K769 Liver disease, unspecified: Secondary | ICD-10-CM

## 2019-04-16 DIAGNOSIS — K8020 Calculus of gallbladder without cholecystitis without obstruction: Secondary | ICD-10-CM

## 2019-04-16 DIAGNOSIS — I700 Atherosclerosis of aorta: Secondary | ICD-10-CM

## 2019-04-16 HISTORY — DX: Atherosclerosis of aorta: I70.0

## 2019-04-16 HISTORY — DX: Calculus of gallbladder without cholecystitis without obstruction: K80.20

## 2019-04-16 MED ORDER — GADOBENATE DIMEGLUMINE 529 MG/ML IV SOLN
16.0000 mL | Freq: Once | INTRAVENOUS | Status: AC | PRN
  Administered 2019-04-16: 16 mL via INTRAVENOUS

## 2019-06-14 ENCOUNTER — Other Ambulatory Visit: Payer: Self-pay

## 2019-06-14 ENCOUNTER — Ambulatory Visit: Payer: 59 | Admitting: Family Medicine

## 2019-06-14 VITALS — BP 128/87 | HR 71 | Temp 97.8°F | Ht 64.5 in | Wt 181.6 lb

## 2019-06-14 DIAGNOSIS — K5720 Diverticulitis of large intestine with perforation and abscess without bleeding: Secondary | ICD-10-CM | POA: Diagnosis not present

## 2019-06-14 DIAGNOSIS — R160 Hepatomegaly, not elsewhere classified: Secondary | ICD-10-CM | POA: Diagnosis not present

## 2019-06-14 DIAGNOSIS — K760 Fatty (change of) liver, not elsewhere classified: Secondary | ICD-10-CM | POA: Diagnosis not present

## 2019-06-14 NOTE — Patient Instructions (Signed)
° ° ° °  If you have lab work done today you will be contacted with your lab results within the next 2 weeks.  If you have not heard from us then please contact us. The fastest way to get your results is to register for My Chart. ° ° °IF you received an x-ray today, you will receive an invoice from [facility]. Please contact [facility] at [phone] with questions or concerns regarding your invoice.  ° °IF you received labwork today, you will receive an invoice from [facility]. Please contact [facility] at [phone] with questions or concerns regarding your invoice.  ° °Our billing staff will not be able to assist you with questions regarding bills from these companies. ° °You will be contacted with the lab results as soon as they are available. The fastest way to get your results is to activate your My Chart account. Instructions are located on the last page of this paperwork. If you have not heard from us regarding the results in 2 weeks, please contact this office. °  ° ° ° °

## 2019-06-14 NOTE — Progress Notes (Signed)
Established Patient Office Visit  Subjective:  Patient ID: Sydney Alvarado, female    DOB: 04/29/1965  Age: 54 y.o. MRN: [id number]  CC:  Chief Complaint  Patient presents with  . Establish Care    Talk about health conditions    HPI Sydney Alvarado presents for  NSAID induced perforation of the bowel Sigmoid colectomy due to the perforation of the bowel  She had an MRI that showed left hepatic lobe lesion, cholelithiasis, mild hepatic steatosis, possible constipation and aortic atherosclerosis.  She was told by her GI not eat dairy, sugar, sugar substitutes, fruits, fruit juices, grains, starches, no legumes.  She states that she has not followed it the way she should. She does not know if she eats    IMPRESSION: 1. Similar to slight decreased size of the left hepatic lobe lesion which remains indeterminate, possibly an atypical hemangioma. Stability to slight decrease over 3 months is reassuring. Consider 1 further follow-up MRI at 6-9 months to confirm stability. 2.  No acute abdominal process. 3. New areas of arterial phase geographic hyperenhancement within the liver, likely due to altered perfusion. No cause identified. 4. Cholelithiasis. 5. Mild hepatic steatosis. 6.  Possible constipation. 7.  Aortic Atherosclerosis (ICD10-I70.0).   Electronically Signed   By: Abigail Miyamoto M.D.   On: 04/16/2019 10:33  She reports that she would like a second opinion.    Wt Readings from Last 3 Encounters:  06/14/19 181 lb 9.6 oz (82.4 kg)  02/26/19 196 lb 3.4 oz (89 kg)  10/25/18 200 lb (90.7 kg)      Past Medical History:  Diagnosis Date  . Allergy   . Diverticulitis   . PONV (postoperative nausea and vomiting)     Past Surgical History:  Procedure Laterality Date  . ANTERIOR CRUCIATE LIGAMENT REPAIR    . DILATION AND CURETTAGE OF UTERUS    . KNEE SURGERY     multiple left knee surgery  . MOUTH SURGERY    . PLANTAR FASCIECTOMY      Family History  Problem  Relation Age of Onset  . Diabetes Mother   . Hypertension Mother   . Hypertension Father   . Cancer Father        skin cancer  . Hypertension Brother   . Hypertension Maternal Grandmother   . Heart disease Maternal Grandfather        heart attack  . Hypertension Paternal Grandmother   . Stroke Paternal Grandmother   . Cancer Paternal Grandfather     Social History   Socioeconomic History  . Marital status: Married    Spouse name: Not on file  . Number of children: Not on file  . Years of education: Not on file  . Highest education level: Not on file  Occupational History  . Occupation: Pharmacist, community  Tobacco Use  . Smoking status: Never Smoker  . Smokeless tobacco: Never Used  Substance and Sexual Activity  . Alcohol use: No  . Drug use: No  . Sexual activity: Yes  Other Topics Concern  . Not on file  Social History Narrative   Marital status: married x 29 years.      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      Seatbelt: 100%; no texting   Social Determinants of Health   Financial Resource Strain:   . Difficulty of Paying Living Expenses:   Food Insecurity:   . Worried About Charity fundraiser in the Last Year:   . Arboriculturist in the Last Year:   Transportation Needs:   . Film/video editor (Medical):   Marland Kitchen Lack of Transportation (Non-Medical):   Physical Activity:   . Days of Exercise per Week:   . Minutes of Exercise per Session:   Stress:   . Feeling of Stress :   Social Connections:   . Frequency of Communication with Friends and Family:   . Frequency of Social Gatherings with Friends and Family:   . Attends Religious Services:   . Active Member of Clubs or Organizations:   .  Attends Archivist Meetings:   Marland Kitchen Marital Status:   Intimate Partner Violence:   . Fear of Current or Ex-Partner:   . Emotionally Abused:   Marland Kitchen Physically Abused:   . Sexually Abused:     Outpatient Medications Prior to Visit  Medication Sig Dispense Refill  . Calcium Carb-Cholecalciferol (CALCIUM-VITAMIN D) 600-400 MG-UNIT TABS Take 2 tablets by mouth every morning.    . Cholecalciferol (VITAMIN D) 125 MCG (5000 UT) CAPS Take 5,000 Units by mouth daily.    . Multiple Vitamins-Minerals (MULTIVITAMIN GUMMIES WOMENS) CHEW Chew 2 each by mouth daily.    . Probiotic Product (PROBIOTIC DAILY PO) Take 1 capsule by mouth daily.    . calcium carbonate (TUMS EX) 750 MG chewable tablet Chew 2 tablets by mouth daily as needed for heartburn.    . traMADol (ULTRAM) 50 MG tablet Take 1-2 tablets (50-100 mg total) by mouth every 6 (six) hours as needed (mild pain). (Patient not taking: Reported on 06/14/2019) 20 tablet 0   No facility-administered medications prior to visit.    Allergies  Allergen Reactions  . Sulfa Antibiotics Hives    ROS Review of Systems Review of Systems  Constitutional: Negative for activity change, appetite change, chills and fever.  HENT: Negative for congestion, nosebleeds, trouble swallowing and voice change.   Respiratory: Negative for cough, shortness of breath and wheezing.   Gastrointestinal: Negative for diarrhea, nausea and vomiting.  Genitourinary: Negative for difficulty urinating, dysuria, flank pain and hematuria.  Musculoskeletal: Negative for back pain, joint swelling and neck pain.  Neurological: Negative for dizziness, speech difficulty, light-headedness and numbness.  See HPI. All other review of systems negative.     Objective:    Physical Exam  BP 128/87 (BP Location: Right Arm, Patient Position: Sitting, Cuff Size: Large)   Pulse 71   Temp 97.8 F (36.6 C) (Temporal)   Ht 5' 4.5" (1.638 m)   Wt 181 lb 9.6 oz (82.4 kg)   SpO2 98%    BMI 30.69 kg/m  Wt Readings from Last 3 Encounters:  06/14/19 181 lb 9.6 oz (82.4 kg)  02/26/19 196 lb 3.4 oz (89 kg)  10/25/18 200 lb (90.7 kg)   Physical Exam  Constitutional: Oriented to person, place, and time. Appears well-developed and well-nourished.  HENT:  Head: Normocephalic and atraumatic.  Eyes: Conjunctivae and EOM are normal.  Cardiovascular: Normal rate, regular rhythm, normal heart sounds and intact distal pulses.  No murmur heard. Pulmonary/Chest: Effort normal and  breath sounds normal. No stridor. No respiratory distress. Has no wheezes.  Abdomen: nondistended, normoactive bs, soft, nontender Neurological: Is alert and oriented to person, place, and time.  Skin: Skin is warm. Capillary refill takes less than 2 seconds.  Psychiatric: Has a normal mood and affect. Behavior is normal. Judgment and thought content normal.    Health Maintenance Due  Topic Date Due  . INFLUENZA VACCINE  10/16/2018    There are no preventive care reminders to display for this patient.  Lab Results  Component Value Date   TSH 2.20 06/12/2015   Lab Results  Component Value Date   WBC 11.9 (H) 02/27/2019   HGB 10.9 (L) 02/27/2019   HCT 35.2 (L) 02/27/2019   MCV 92.9 02/27/2019   PLT 194 02/27/2019   Lab Results  Component Value Date   NA 138 02/27/2019   K 4.3 02/27/2019   CO2 26 02/27/2019   GLUCOSE 92 02/27/2019   BUN 8 02/27/2019   CREATININE 0.74 02/27/2019   BILITOT 1.0 10/25/2018   ALKPHOS 66 10/25/2018   AST 18 10/25/2018   ALT 22 10/25/2018   PROT 8.1 10/25/2018   ALBUMIN 4.0 10/25/2018   CALCIUM 8.7 (L) 02/27/2019   ANIONGAP 8 02/27/2019   Lab Results  Component Value Date   CHOL 186 06/12/2015   Lab Results  Component Value Date   HDL 44 (L) 06/12/2015   Lab Results  Component Value Date   LDLCALC 91 06/12/2015   Lab Results  Component Value Date   TRIG 255 (H) 06/12/2015   Lab Results  Component Value Date   CHOLHDL 4.2 06/12/2015   Lab  Results  Component Value Date   HGBA1C 5.8 (H) 06/12/2015      Assessment & Plan:   Problem List Items Addressed This Visit      Digestive   Diverticulitis of large intestine with perforation without abscess   Relevant Orders   Amb Referral to Hepatology    Other Visit Diagnoses    Liver mass, left lobe    -  Primary   Relevant Orders   Amb Referral to Hepatology   Fatty liver       Relevant Orders   Amb Referral to Hepatology      Discussed at length her previous surgeries Would advised pt to go to the Liver Clinic at [facility] for an evaluation Discussed the recommended Diet by Dr. Burgess Amor to continue to avoid dairy, starchies and high fats She will return to clinic for a physical 06/15/19   No orders of the defined types were placed in this encounter.   Follow-up: No follow-ups on file.    A total of 25 minutes were spent face-to-face with the patient during this encounter and over half of that time was spent on counseling and coordination of care.  Forrest Moron, MD

## 2019-06-15 ENCOUNTER — Encounter: Payer: Self-pay | Admitting: Family Medicine

## 2019-06-15 ENCOUNTER — Ambulatory Visit (INDEPENDENT_AMBULATORY_CARE_PROVIDER_SITE_OTHER): Payer: 59 | Admitting: Family Medicine

## 2019-06-15 VITALS — BP 122/79 | HR 63 | Temp 97.2°F | Resp 17 | Ht 64.5 in | Wt 182.0 lb

## 2019-06-15 DIAGNOSIS — I700 Atherosclerosis of aorta: Secondary | ICD-10-CM | POA: Diagnosis not present

## 2019-06-15 DIAGNOSIS — R739 Hyperglycemia, unspecified: Secondary | ICD-10-CM | POA: Diagnosis not present

## 2019-06-15 DIAGNOSIS — Z0000 Encounter for general adult medical examination without abnormal findings: Secondary | ICD-10-CM

## 2019-06-15 DIAGNOSIS — K5790 Diverticulosis of intestine, part unspecified, without perforation or abscess without bleeding: Secondary | ICD-10-CM | POA: Diagnosis not present

## 2019-06-15 DIAGNOSIS — K760 Fatty (change of) liver, not elsewhere classified: Secondary | ICD-10-CM

## 2019-06-15 DIAGNOSIS — Z0001 Encounter for general adult medical examination with abnormal findings: Secondary | ICD-10-CM | POA: Diagnosis not present

## 2019-06-15 DIAGNOSIS — E781 Pure hyperglyceridemia: Secondary | ICD-10-CM

## 2019-06-15 NOTE — Patient Instructions (Signed)
° ° ° °  If you have lab work done today you will be contacted with your lab results within the next 2 weeks.  If you have not heard from us then please contact us. The fastest way to get your results is to register for My Chart. ° ° °IF you received an x-ray today, you will receive an invoice from [facility]. Please contact [facility] at [phone] with questions or concerns regarding your invoice.  ° °IF you received labwork today, you will receive an invoice from [facility]. Please contact [facility] at [phone] with questions or concerns regarding your invoice.  ° °Our billing staff will not be able to assist you with questions regarding bills from these companies. ° °You will be contacted with the lab results as soon as they are available. The fastest way to get your results is to activate your My Chart account. Instructions are located on the last page of this paperwork. If you have not heard from us regarding the results in 2 weeks, please contact this office. °  ° ° ° °

## 2019-06-15 NOTE — Progress Notes (Signed)
Chief Complaint  Patient presents with  . Annual Exam    no pap    Subjective:  Sydney Alvarado is a 54 y.o. female here for a health maintenance visit.  Patient is established pt    Patient Active Problem List   Diagnosis Date Noted  . Diverticular disease 02/25/2019  . Diverticulitis of large intestine with perforation without abscess 10/26/2018  . Abdominal pain 10/25/2018  . Acute diverticulitis 10/25/2018  . Leukocytosis 10/25/2018    Past Medical History:  Diagnosis Date  . Allergy   . Diverticulitis   . PONV (postoperative nausea and vomiting)     Past Surgical History:  Procedure Laterality Date  . ANTERIOR CRUCIATE LIGAMENT REPAIR    . DILATION AND CURETTAGE OF UTERUS    . KNEE SURGERY     multiple left knee surgery  . MOUTH SURGERY    . PLANTAR FASCIECTOMY       Outpatient Medications Prior to Visit  Medication Sig Dispense Refill  . Calcium Carb-Cholecalciferol (CALCIUM-VITAMIN D) 600-400 MG-UNIT TABS Take 2 tablets by mouth every morning.    . calcium carbonate (TUMS EX) 750 MG chewable tablet Chew 2 tablets by mouth daily as needed for heartburn.    . Cholecalciferol (VITAMIN D) 125 MCG (5000 UT) CAPS Take 5,000 Units by mouth daily.    . Multiple Vitamins-Minerals (MULTIVITAMIN GUMMIES WOMENS) CHEW Chew 2 each by mouth daily.    . Probiotic Product (PROBIOTIC DAILY PO) Take 1 capsule by mouth daily.    . traMADol (ULTRAM) 50 MG tablet Take 1-2 tablets (50-100 mg total) by mouth every 6 (six) hours as needed (mild pain). (Patient not taking: Reported on 06/14/2019) 20 tablet 0   No facility-administered medications prior to visit.    Allergies  Allergen Reactions  . Sulfa Antibiotics Hives     Family History  Problem Relation Age of Onset  . Diabetes Mother   . Hypertension Mother   . Hypertension Father   . Cancer Father        skin cancer  . Hypertension Brother   . Hypertension Maternal Grandmother   . Heart disease Maternal Grandfather         heart attack  . Hypertension Paternal Grandmother   . Stroke Paternal Grandmother   . Cancer Paternal Grandfather      Health Habits: Dental Exam: up to date Eye Exam: up to date Exercise:  times/week on average Current exercise activities: walking/running Diet:   Social History   Socioeconomic History  . Marital status: Married    Spouse name: Not on file  . Number of children: Not on file  . Years of education: Not on file  . Highest education level: Not on file  Occupational History  . Occupation: Pharmacist, community  Tobacco Use  . Smoking status: Never Smoker  . Smokeless tobacco: Never Used  Substance and Sexual Activity  . Alcohol use: No  . Drug use: No  . Sexual activity: Yes  Other Topics Concern  . Not on file  Social History Narrative   Marital status: married x 29 years.      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      Seatbelt: 100%; no texting   Social Determinants of Health   Financial Resource Strain:   . Difficulty of Paying Living Expenses:   Food Insecurity:   . Worried About Charity fundraiser in the Last Year:   . Arboriculturist in the Last Year:   Transportation Needs:   . Film/video editor (Medical):   Marland Kitchen Lack of Transportation (Non-Medical):   Physical Activity:   . Days of Exercise per Week:   . Minutes of Exercise per Session:   Stress:   . Feeling of Stress :   Social Connections:   . Frequency of Communication with Friends and Family:   . Frequency of Social Gatherings with Friends and Family:   . Attends Religious Services:   . Active Member of Clubs or Organizations:   . Attends Archivist Meetings:   Marland Kitchen Marital Status:   Intimate Partner  Violence:   . Fear of Current or Ex-Partner:   . Emotionally Abused:   Marland Kitchen Physically Abused:   . Sexually Abused:    Social History   Substance and Sexual Activity  Alcohol Use No   Social History   Tobacco Use  Smoking Status Never Smoker  Smokeless Tobacco Never Used   Social History   Substance and Sexual Activity  Drug Use No    GYN: Sexual Health Menstrual status: regular menses LMP: Patient's last menstrual period was 05/25/2019. Last pap smear: see HM section History of abnormal pap smears:  Sexually active: ** with ** partner Current contraception:   Health Maintenance: See under health Maintenance activity for review of completion dates as well. Immunization History  Administered Date(s) Administered  . Influenza, Seasonal, Injecte, Preservative Fre 03/26/2012  . Influenza,inj,Quad PF,6+ Mos 02/01/2015  . PFIZER SARS-COV-2 Vaccination 06/23/2019  . Tdap 06/12/2015      Depression Screen-PHQ2/9 Depression screen [facility] 2/9 06/15/2019 06/14/2019 06/01/2017 06/12/2015 02/01/2015  Decreased Interest 0 - 0 0 0  Down, Depressed, Hopeless 0 0 0 0 0  PHQ - 2 Score 0 0 0 0 0       Depression Severity and Treatment Recommendations:  0-4= None  5-9= Mild / Treatment: Support, educate to call if worse; return in one month  10-14= Moderate / Treatment: Support, watchful waiting; Antidepressant or Psycotherapy  15-19= Moderately severe / Treatment: Antidepressant OR Psychotherapy  >= 20 = Major depression, severe / Antidepressant AND Psychotherapy    Review of Systems   Review of Systems  Constitutional: Negative for chills and fever.  Cardiovascular: Negative for chest pain and palpitations.  Gastrointestinal: Negative for abdominal pain, nausea and vomiting.  Genitourinary: Negative for dysuria and urgency.  Skin: Negative for itching and rash.  Neurological: Negative for dizziness, tingling and headaches.  Endo/Heme/Allergies: Positive for environmental  allergies.    See HPI for ROS as well.    Objective:   Vitals:   06/15/19 0911  BP: 122/79  Pulse: 63  Resp: 17  Temp: (!) 97.2 F (36.2 C)  TempSrc: Temporal  SpO2: 98%  Weight: 182 lb (82.6 kg)  Height: 5' 4.5" (1.638 m)    Body mass index is 30.76 kg/m.  Physical Exam Constitutional:      Appearance: Normal appearance.  HENT:     Head: Normocephalic and atraumatic.  Cardiovascular:     Rate and Rhythm: Normal rate and regular rhythm.     Pulses: Normal pulses.  Heart sounds: No murmur.  Pulmonary:     Effort: Pulmonary effort is normal. No respiratory distress.     Breath sounds: Normal breath sounds. No stridor. No wheezing or rhonchi.  Skin:    General: Skin is warm.     Capillary Refill: Capillary refill takes less than 2 seconds.  Neurological:     General: No focal deficit present.     Mental Status: She is alert and oriented to person, place, and time.        Assessment/Plan:   Patient was seen for a health maintenance exam.  Counseled the patient on health maintenance issues. Reviewed her health mainteance schedule and ordered appropriate tests (see orders.) Counseled on regular exercise and weight management. Recommend regular eye exams and dental cleaning.   The following issues were addressed today for health maintenance:   Wilmer was seen today for annual exam.  Diagnoses and all orders for this visit:  Healthcare maintenance- Women's Health Maintenance Plan Advised monthly breast exam and annual mammogram Advised dental exam every six months Discussed stress management Discussed pap smear screening guidelines    Elevated blood sugar -     Hemoglobin A1c  Diverticular disease -     CBC  Aortic atherosclerosis (HCC) -     Lipid panel  High triglycerides -     Comprehensive metabolic panel -     Lipid panel  Non-alcoholic fatty liver disease -     Comprehensive metabolic panel    No follow-ups on file.    Body mass  index is 30.76 kg/m.:  Discussed the patient's BMI with patient. The BMI body mass index is 30.76 kg/m.     Future Appointments  Date Time Provider [place]  07/20/2019  4:45 PM [facility]    Patient Instructions       If you have lab work done today you will be contacted with your lab results within the next 2 weeks.  If you have not heard from [place] then please contact us. The fastest way to get your results is to register for My Chart.   IF you received an x-ray today, you will receive an invoice from [facility]. Please contact [facility] at [phone] with questions or concerns regarding your invoice.   IF you received labwork today, you will receive an invoice from [facility]. Please contact [facility] at [phone] with questions or concerns regarding your invoice.   Our billing staff will not be able to assist you with questions regarding bills from these companies.  You will be contacted with the lab results as soon as they are available. The fastest way to get your results is to activate your My Chart account. Instructions are located on the last page of this paperwork. If you have not heard from [place] regarding the results in 2 weeks, please contact this office.

## 2019-06-16 LAB — LIPID PANEL
Chol/HDL Ratio: 3.8 ratio (ref 0.0–4.4)
Cholesterol, Total: 223 mg/dL — ABNORMAL HIGH (ref 100–199)
HDL: 58 mg/dL (ref >39)
LDL Chol Calc (NIH): 136 mg/dL — ABNORMAL HIGH (ref 0–99)
Triglycerides: 164 mg/dL — ABNORMAL HIGH (ref 0–149)
VLDL Cholesterol Cal: 29 mg/dL (ref 5–40)

## 2019-06-16 LAB — COMPREHENSIVE METABOLIC PANEL
ALT: 15 IU/L (ref 0–32)
AST: 17 IU/L (ref 0–40)
Albumin/Globulin Ratio: 1.5 (ref 1.2–2.2)
Albumin: 4.6 g/dL (ref 3.8–4.9)
Alkaline Phosphatase: 78 IU/L (ref 39–117)
BUN/Creatinine Ratio: 8 — ABNORMAL LOW (ref 9–23)
BUN: 7 mg/dL (ref 6–24)
Bilirubin Total: 0.3 mg/dL (ref 0.0–1.2)
CO2: 23 mmol/L (ref 20–29)
Calcium: 9.8 mg/dL (ref 8.7–10.2)
Chloride: 104 mmol/L (ref 96–106)
Creatinine, Ser: 0.83 mg/dL (ref 0.57–1.00)
GFR calc Af Amer: 94 mL/min/{1.73_m2} (ref >59)
GFR calc non Af Amer: 82 mL/min/{1.73_m2} (ref >59)
Globulin, Total: 3 g/dL (ref 1.5–4.5)
Glucose: 91 mg/dL (ref 65–99)
Potassium: 4.8 mmol/L (ref 3.5–5.2)
Sodium: 140 mmol/L (ref 134–144)
Total Protein: 7.6 g/dL (ref 6.0–8.5)

## 2019-06-16 LAB — HEMOGLOBIN A1C
Est. average glucose Bld gHb Est-mCnc: 117 mg/dL
Hgb A1c MFr Bld: 5.7 % — ABNORMAL HIGH (ref 4.8–5.6)

## 2019-06-16 LAB — CBC
Hematocrit: 43.8 % (ref 34.0–46.6)
Hemoglobin: 14.3 g/dL (ref 11.1–15.9)
MCH: 28.8 pg (ref 26.6–33.0)
MCHC: 32.6 g/dL (ref 31.5–35.7)
MCV: 88 fL (ref 79–97)
Platelets: 250 10*3/uL (ref 150–450)
RBC: 4.97 x10E6/uL (ref 3.77–5.28)
RDW: 13 % (ref 11.7–15.4)
WBC: 8.7 10*3/uL (ref 3.4–10.8)

## 2019-06-23 ENCOUNTER — Ambulatory Visit: Payer: Self-pay | Attending: Internal Medicine

## 2019-06-23 DIAGNOSIS — Z23 Encounter for immunization: Secondary | ICD-10-CM

## 2019-06-23 NOTE — Progress Notes (Signed)
   Covid-19 Vaccination Clinic  Name:  BANNER IRELAN    MRN: [id number] DOB: [birth year]  06/23/2019  Ms. Straw was observed post Covid-19 immunization for 15 minutes without incident. She was provided with Vaccine Information Sheet and instruction to access the V-Safe system.   Ms. Eskander was instructed to call 911 with any severe reactions post vaccine: Marland Kitchen Difficulty breathing  . Swelling of face and throat  . A fast heartbeat  . A bad rash all over body  . Dizziness and weakness   Immunizations Administered    Name Date Dose VIS Date Route   Pfizer COVID-19 Vaccine 06/23/2019 12:35 PM 0.3 mL 02/25/2019 Intramuscular   Manufacturer: [place]   Lot: [id number]   [place]: [id number]

## 2019-06-28 ENCOUNTER — Telehealth: Payer: Self-pay | Admitting: Family Medicine

## 2019-06-28 NOTE — Telephone Encounter (Signed)
Patient called [facility] and was told that the paperwork from our office is not what they need . They need their own paperwork filled out .   Patient was calling [phone]  Please reach out to patient [phone]

## 2019-06-30 ENCOUNTER — Encounter: Payer: Self-pay | Admitting: Physician Assistant

## 2019-06-30 ENCOUNTER — Ambulatory Visit: Payer: 59 | Admitting: Physician Assistant

## 2019-06-30 ENCOUNTER — Other Ambulatory Visit: Payer: Self-pay

## 2019-06-30 DIAGNOSIS — Z1283 Encounter for screening for malignant neoplasm of skin: Secondary | ICD-10-CM

## 2019-06-30 DIAGNOSIS — D485 Neoplasm of uncertain behavior of skin: Secondary | ICD-10-CM | POA: Diagnosis not present

## 2019-06-30 DIAGNOSIS — D492 Neoplasm of unspecified behavior of bone, soft tissue, and skin: Secondary | ICD-10-CM

## 2019-06-30 DIAGNOSIS — L820 Inflamed seborrheic keratosis: Secondary | ICD-10-CM | POA: Diagnosis not present

## 2019-07-08 ENCOUNTER — Telehealth: Payer: Self-pay | Admitting: Family Medicine

## 2019-07-08 NOTE — Telephone Encounter (Signed)
Error

## 2019-07-08 NOTE — Telephone Encounter (Signed)
Patient called 06/28/2019 with   Patient had called [phone] and was told that they could not process referral because it had to be their referral info (their own form ) [facility] had faxed their paperwork  To [place] 04/02/202 at 3:40 pm and is still waiting for them to be returned   This would be from   [facility], or [facility].    Please reach out to patient if needed [phone]

## 2019-07-18 ENCOUNTER — Ambulatory Visit: Payer: Self-pay

## 2019-07-20 ENCOUNTER — Ambulatory Visit: Payer: 59 | Attending: Internal Medicine

## 2019-07-20 DIAGNOSIS — Z23 Encounter for immunization: Secondary | ICD-10-CM

## 2019-07-20 NOTE — Progress Notes (Signed)
   Covid-19 Vaccination Clinic  Name:  Sydney Alvarado    MRN: [id number] DOB: [birth year]  07/20/2019  [name] was observed post Covid-19 immunization for 15 minutes without incident. She was provided with Vaccine Information Sheet and instruction to access the V-Safe system.   [name] was instructed to call 911 with any severe reactions post vaccine: Marland Kitchen Difficulty breathing  . Swelling of face and throat  . A fast heartbeat  . A bad rash all over body  . Dizziness and weakness   Immunizations Administered    Name Date Dose VIS Date Route   Pfizer COVID-19 Vaccine 07/20/2019  5:00 PM 0.3 mL 05/11/2018 Intramuscular   Manufacturer: [place]   Lot: [id number]   [place]: [id number]

## 2019-08-12 NOTE — Addendum Note (Signed)
Addended by: Warren Danes on: 08/12/2019 04:38 PM   Modules accepted: Orders

## 2019-08-12 NOTE — Progress Notes (Addendum)
   Follow-Up Visit   Subjective  Sydney Alvarado is a 54 y.o. female who presents for the following: Annual Exam (top scalp new raised,  right abdomen near surgery site ).   The following portions of the chart were reviewed this encounter and updated as appropriate: Tobacco  Allergies  Meds  Problems  Med Hx  Surg Hx  Fam Hx      Objective  Well appearing patient in no apparent distress; mood and affect are within normal limits.  A full examination was performed including scalp, head, eyes, ears, nose, lips, neck, chest, axillae, abdomen, back, buttocks, bilateral upper extremities, bilateral lower extremities, hands, feet, fingers, toes, fingernails, and toenails. All findings within normal limits unless otherwise noted below.  Objective  Right Abdomen (side) - Lower: Flat brown plaque     Objective  head to toe: No DN or signs of NMSC  Objective  Right Axilla, Right Flank, Right Lower Back (2), Right Upper Back: Erythematous stuck-on, waxy papule or plaque.   Assessment & Plan  Neoplasm of skin Right Abdomen (side) - Lower  Epidermal / dermal shaving  Lesion length (cm):  1.2 Lesion width (cm):  1.2 Margin per side (cm):  0.1 Total excision diameter (cm):  1.4 Informed consent: discussed and consent obtained   Timeout: patient name, date of birth, surgical site, and procedure verified   Procedure prep:  Patient was prepped and draped in usual sterile fashion Prep type:  Chlorhexidine Anesthesia: the lesion was anesthetized in a standard fashion   Anesthetic:  1% lidocaine w/ epinephrine 1-100,000 local infiltration Instrument used: DermaBlade   Hemostasis achieved with: aluminum chloride   Outcome: patient tolerated procedure well   Post-procedure details: sterile dressing applied and wound care instructions given   Dressing type: petrolatum gauze, petrolatum and bandage    Specimen 1 - Surgical pathology Differential Diagnosis: ISK Check Margins:  No  Screening exam for skin cancer head to toe  Yearly skin examinations  Seborrheic keratoses, inflamed (5) Right Flank; Right Axilla; Right Upper Back; Right Lower Back (2)  Destruction of lesion - Right Axilla, Right Flank, Right Lower Back (2), Right Upper Back Complexity: simple   Destruction method: cryotherapy   Informed consent: discussed and consent obtained   Timeout:  patient name, date of birth, surgical site, and procedure verified Lesion destroyed using liquid nitrogen: Yes   Cryotherapy cycles:  1 Outcome: patient tolerated procedure well with no complications   Post-procedure details: wound care instructions given

## 2019-08-17 ENCOUNTER — Telehealth: Payer: Self-pay | Admitting: Family Medicine

## 2019-08-17 NOTE — Telephone Encounter (Signed)
Please F/U with pt regarding referral. Pt will be in meeting from 12-1pm.  Thank you

## 2019-08-17 NOTE — Telephone Encounter (Signed)
Patient has been calling for almost 2 months to get this referral to [facility] out. Patient states she is willing to do what she can to get this taken care of. Patient fears that the fact that Dr. Kaleen Mask has left and this adds another layer of confusion. Someone called from our office on May 13th to follow up.Whom ever she spoke with said they  would call her back  But no follow up after that phone call .     Patient needs resolve . Please reach out to patient  Patient is in a meeting today from 12:00-1:00

## 2019-09-12 ENCOUNTER — Telehealth: Payer: Self-pay

## 2019-09-12 NOTE — Telephone Encounter (Signed)
[facility] referral faxed 08/25/2019 to [phone]. Copy of form will be at nurses station

## 2020-01-04 DIAGNOSIS — K760 Fatty (change of) liver, not elsewhere classified: Secondary | ICD-10-CM | POA: Insufficient documentation

## 2020-01-04 DIAGNOSIS — K769 Liver disease, unspecified: Secondary | ICD-10-CM | POA: Insufficient documentation

## 2020-04-20 LAB — HM MAMMOGRAPHY

## 2020-05-04 ENCOUNTER — Telehealth: Payer: Self-pay | Admitting: Internal Medicine

## 2020-05-04 NOTE — Telephone Encounter (Signed)
Patient is requesting a transfer of care from Dr. Lorie Apley office to [place]. She is also being seen at [facility]. Requested records from Dr. Collene Mares for Dr. Hilarie Fredrickson to review per patients request.

## 2020-05-07 NOTE — Telephone Encounter (Signed)
Hi Dr. Hilarie Fredrickson., this pt would like to transfer her GI care over to you from Dr. Collene Mares because she has not been pleased with the care that she received. I spoke with pt who told me that she had a colectomy in December of 2020. She has been experiencing abdominal pain lately in the area of the surgery so she would like to be checked. She goes to [facility] for issues with her liver so she would like to continue her liver care over there. She mentioned that her doctor at [facility] recommended that pt sees you for other GI related care. Records from [facility] are in [place] and I will sent her GI records from Dr. Collene Mares for your review. Please advise on scheduling. Thank you.

## 2020-05-10 NOTE — Telephone Encounter (Signed)
ok 

## 2020-05-14 ENCOUNTER — Encounter: Payer: Self-pay | Admitting: Internal Medicine

## 2020-05-14 NOTE — Telephone Encounter (Signed)
Consult scheduled on 06/21/20 at 10:30am.

## 2020-06-01 IMAGING — US ARTERIAL AND VENOUS ULTRASOUND OF THE ABDOMEN PELVIS AND SCROTUM
1 series · 13 of 25 positions shown · non-contrast
Comparison: Abdominopelvic CT same date.

CLINICAL DATA: Pelvic pain for 1-2 days. Followup ruptured
hemorrhagic cyst on CT. LMP 10/01/2018.

EXAM:
TRANSABDOMINAL AND TRANSVAGINAL ULTRASOUND OF PELVIS
DOPPLER ULTRASOUND OF OVARIES
TECHNIQUE: Both transabdominal and transvaginal ultrasound examinations of the
pelvis were performed. Transabdominal technique was performed for
global imaging of the pelvis including uterus, ovaries, adnexal
regions, and pelvic cul-de-sac.
It was necessary to proceed with endovaginal exam following the
transabdominal exam to visualize the endometrium and ovaries to
better advantage. Color and duplex Doppler ultrasound was utilized
to evaluate blood flow to the ovaries.

[Series 1: arterial and venous ultrasound of the abdomen pelv · 13 of 37 slices shown]
[im 1/37]
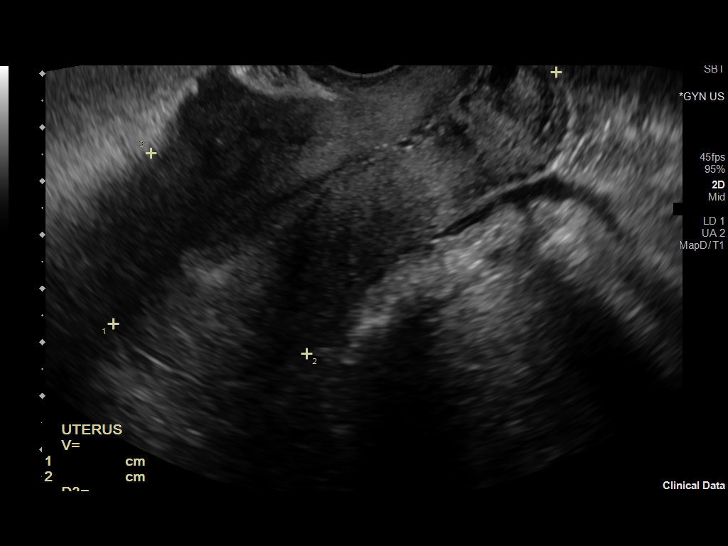
[im 4/37]
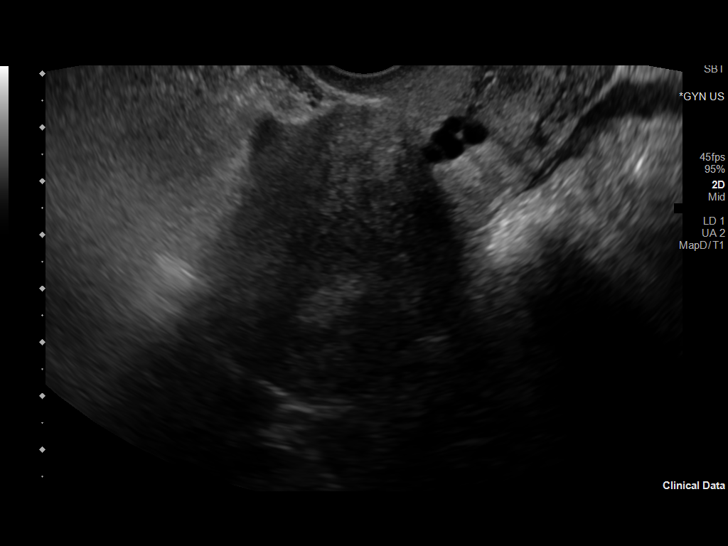
[im 7/37]
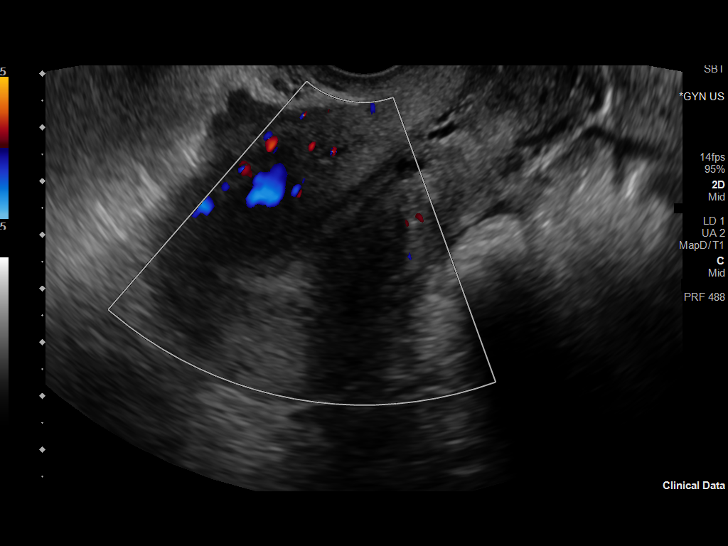
[im 10/37]
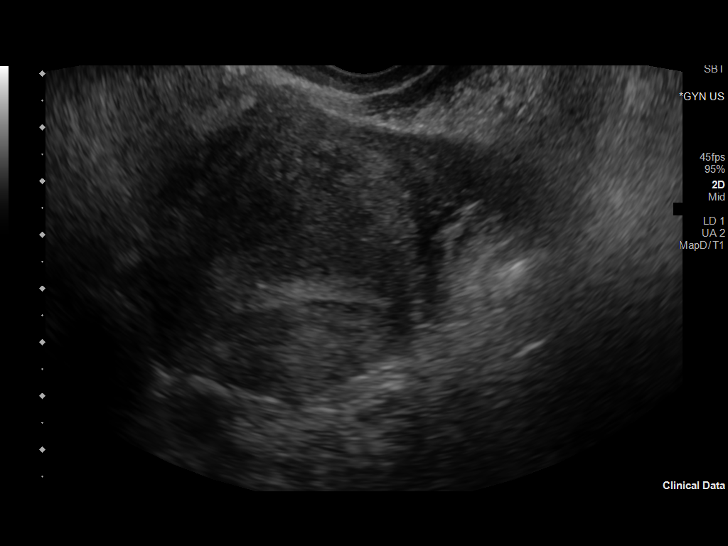
[im 13/37]
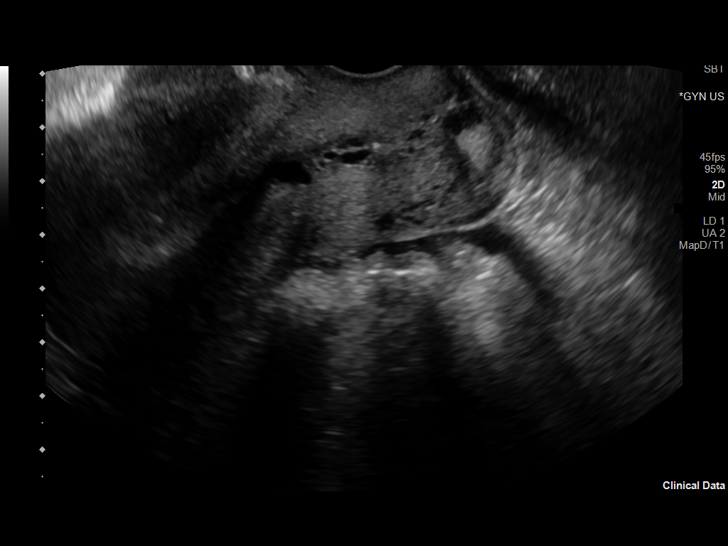
[im 16/37]
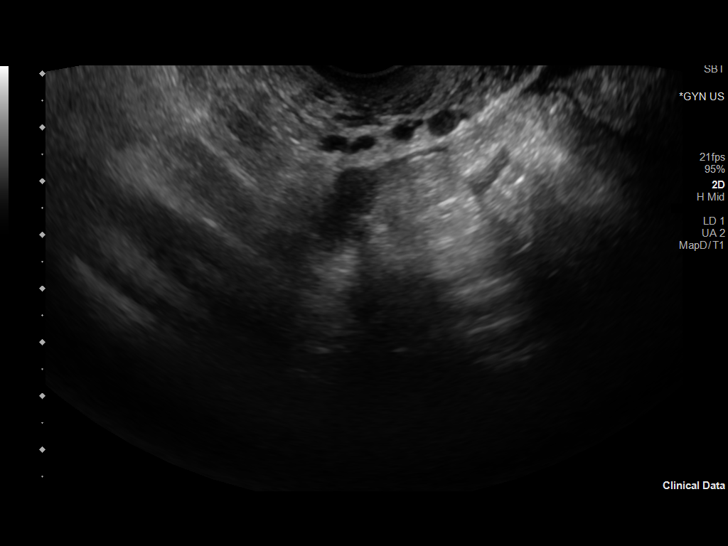
[im 19/37]
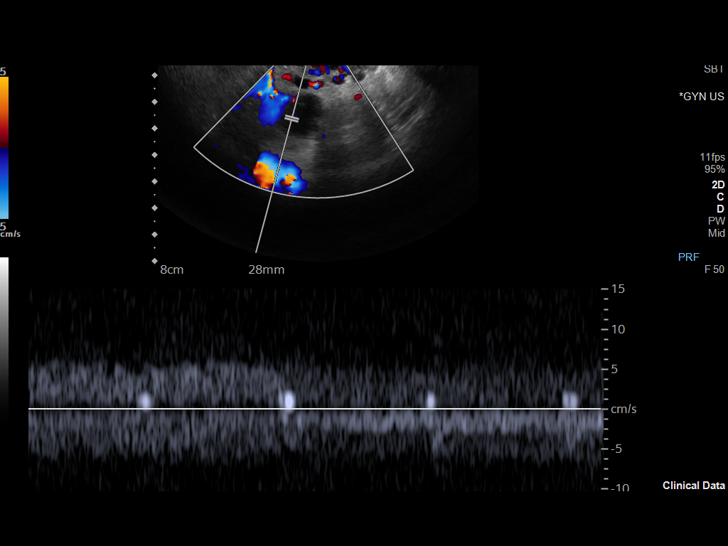
[im 22/37]
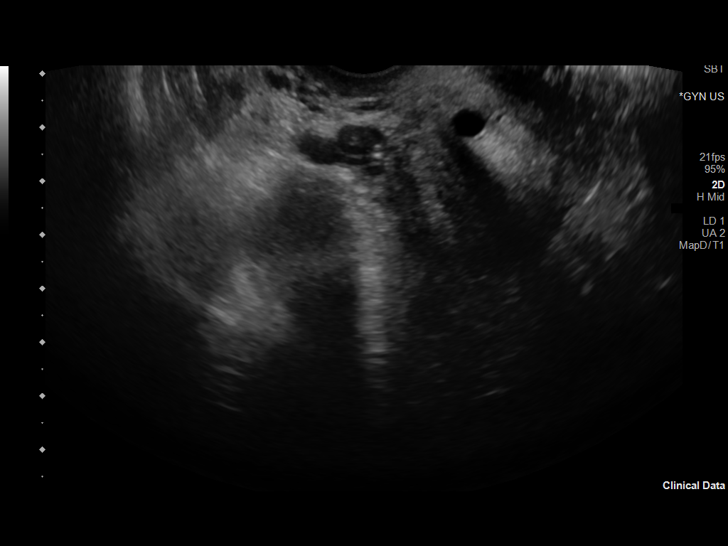
[im 25/37]
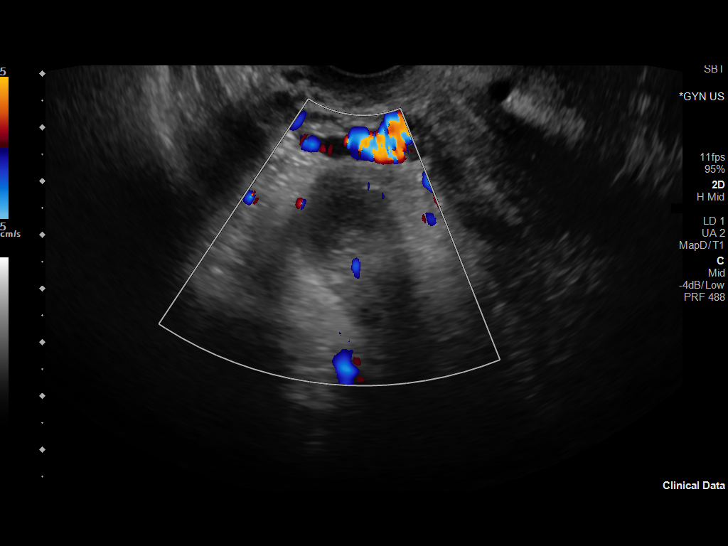
[im 28/37]
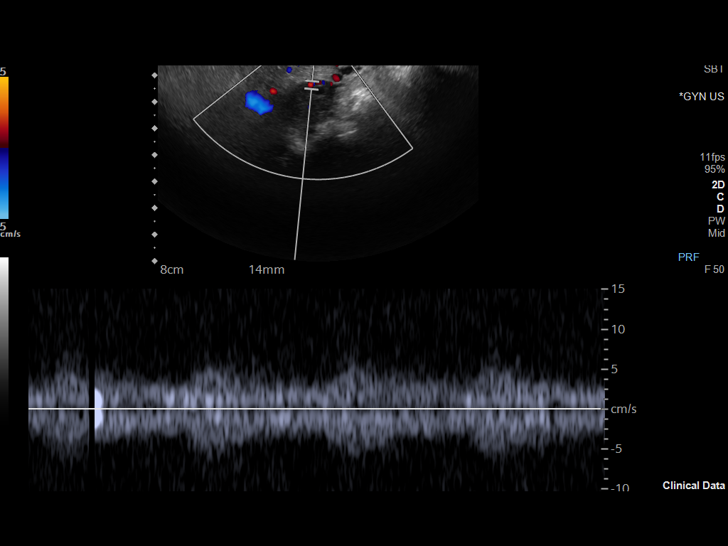
[im 31/37]
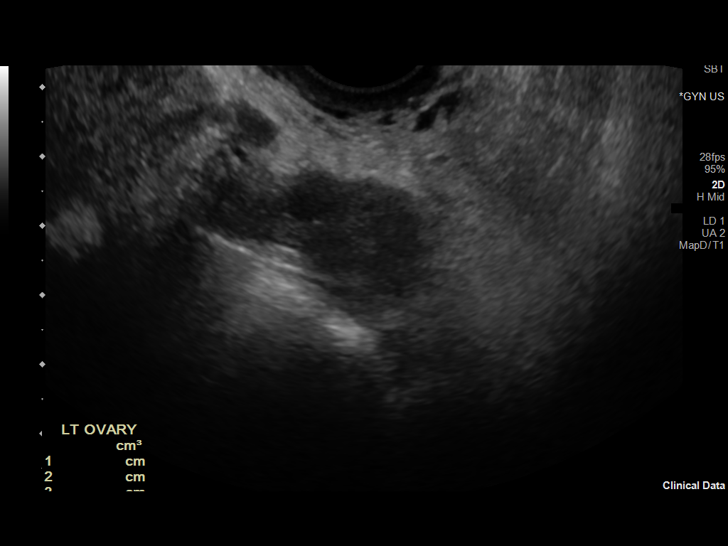
[im 34/37]
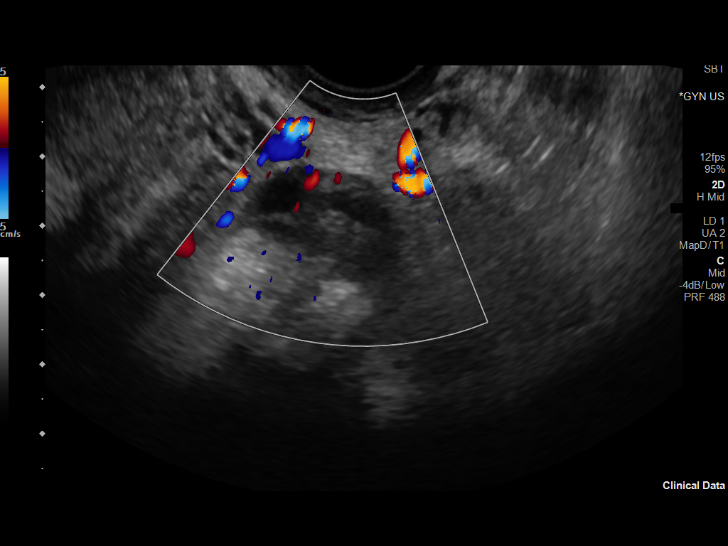
[im 37/37]
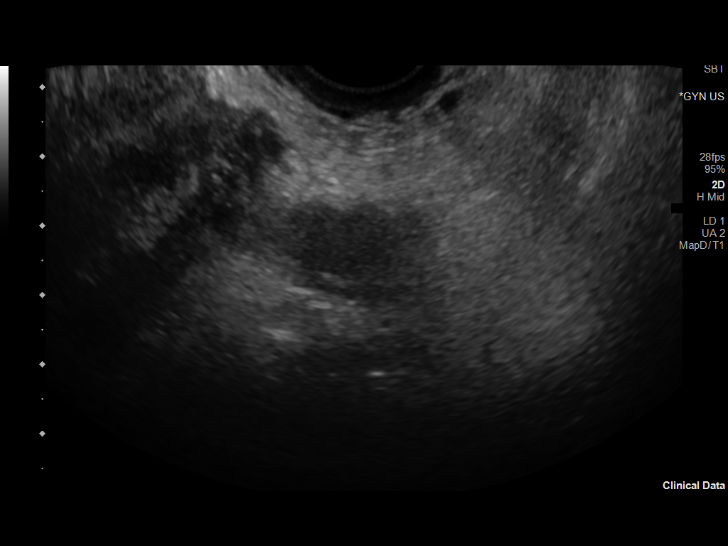

[13 of 25 positions shown; findings below may reference images not displayed]

FINDINGS: Uterus

Measurements: 9.5 x 4.7 x 5.5 cm = volume: 129 mL. No fibroids or
other mass visualized.

Endometrium

Thickness: 11 mm.  No focal abnormality visualized.

Right ovary

Measurements: 3.6 x 1.8 x 2.0 cm = volume: 6.9 mL. Normal
appearance/no adnexal mass.

Left ovary

Measurements: 2.7 x 1.5 x 2.4 cm = volume: 5.1 mL. Normal
appearance/no adnexal mass.

Pulsed Doppler evaluation of both ovaries demonstrates normal
low-resistance arterial and venous waveforms.

Other findings

There is a small amount of free pelvic fluid, within physiologic
limits.
IMPRESSION: 1. Normal pelvic ultrasound. The uterus and both ovaries appear
normal.
2. To my review of the prior CT, there is soft tissue stranding in
the pelvis surrounding the sigmoid colon. There are tiny
extraluminal air bubbles, and these findings are consistent with
acute diverticulitis. No evidence of abscess or bowel obstruction.

## 2020-06-14 ENCOUNTER — Encounter: Payer: Self-pay | Admitting: *Deleted

## 2020-06-21 ENCOUNTER — Ambulatory Visit: Payer: 59 | Admitting: Internal Medicine

## 2020-06-21 ENCOUNTER — Other Ambulatory Visit: Payer: Self-pay

## 2020-06-21 ENCOUNTER — Encounter: Payer: Self-pay | Admitting: Internal Medicine

## 2020-06-21 VITALS — BP 130/80 | HR 58 | Ht 64.0 in | Wt 191.0 lb

## 2020-06-21 DIAGNOSIS — K760 Fatty (change of) liver, not elsewhere classified: Secondary | ICD-10-CM | POA: Diagnosis not present

## 2020-06-21 DIAGNOSIS — R109 Unspecified abdominal pain: Secondary | ICD-10-CM

## 2020-06-21 DIAGNOSIS — K769 Liver disease, unspecified: Secondary | ICD-10-CM

## 2020-06-21 DIAGNOSIS — R1032 Left lower quadrant pain: Secondary | ICD-10-CM

## 2020-06-21 NOTE — Patient Instructions (Signed)
Please purchase the following medications over the counter and take as directed: Salonpas  Continue Metamucil.  Continue probiotics.  You will be due for a recall colonoscopy in 10/2027. We will send you a reminder in the mail when it gets closer to that time.  Please follow up with Dr Hilarie Fredrickson in 6 months.  If you are age 53 or older, your body mass index should be between 23-30. Your Body mass index is 32.79 kg/m. If this is out of the aforementioned range listed, please consider follow up with your Primary Care Provider.  If you are age 97 or younger, your body mass index should be between 19-25. Your Body mass index is 32.79 kg/m. If this is out of the aformentioned range listed, please consider follow up with your Primary Care Provider.   Due to recent changes in healthcare laws, you may see the results of your imaging and laboratory studies on MyChart before your provider has had a chance to review them.  We understand that in some cases there may be results that are confusing or concerning to you. Not all laboratory results come back in the same time frame and the provider may be waiting for multiple results in order to interpret others.  Please give [place] 48 hours in order for your provider to thoroughly review all the results before contacting the office for clarification of your results.

## 2020-06-21 NOTE — Progress Notes (Signed)
Patient ID: Sydney Alvarado, female   DOB: 06/26/1965, 55 y.o.   MRN: [id number] HPI: Sydney Alvarado is a 55 year old female with a past medical history of complicated diverticulitis status post robotic assisted sigmoidectomy in December 2020 with Dr. Marcello Moores, history of left-sided hyperplastic colon polyps x2 at colonoscopy in 2019, fatty liver/NAFLD without NASH, 2.5 x 2 cm left hepatic lobe lesion (adenoma versus hemangioma) who is seen in consult at the request of Dr. Nolon Rod to evaluate left lower quadrant abdominal pain.  She is here alone today.  She reports that she is now working with Dr. Dorris Fetch at [facility] hepatology regarding her fatty liver and left hepatic lobe lesion.  On imaging locally it was felt that there was most likely benign hemangioma.  MRI with and without contrast was repeated in January 2022 and while this lesion is stable the question of hepatic adenoma was raised.  She did take 12 years of OCPs but has been off for 20+ years.  Her liver enzymes have been normal and she has been working on eating a healthy, low carbohydrate diet and also exercising.  Her goal has been weight reduction.  She has found this very stressful and she does worry about her overall liver health.  She has been strictly avoiding Tylenol and NSAIDs.  Separate from her liver issues she has had pain in the left lower quadrant for multiple months really starting several months after surgery.  It is very near the surgical site in the left lower abdomen.  This is worse with twisting, bending or stretching backwards.  At times it can be a severe type pain and cause her to take a deep breath.  Also worse if lifting objects or weights.  The pain does not really radiate.  It does not relate to bowel movements.  Her bowel movements have been regular 1-3 times per day.  No bloating symptom.  No blood in stool or melena.  She takes Metamucil and probiotic on a very regular basis.  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  She is married with 2 children.  No alcohol intake.  No family history of liver disease.  She does have a family history of diabetes and heart disease.  She has had multiple knee surgeries, surgery on the left and right foot in addition to the sigmoid resection as above.  Past Medical History:  Diagnosis Date  . Allergy   . Aortic atherosclerosis ([place]) 04/16/2019  . Cholelithiasis 04/16/2019  . Diverticulitis 10/2018   perforated  . Hepatic steatosis   . Hyperhidrosis   . Liver lesion   . PONV (postoperative nausea and vomiting)   . Sigmoid diverticulosis     Past Surgical History:  Procedure Laterality Date  . ANTERIOR CRUCIATE LIGAMENT REPAIR    . DILATION AND CURETTAGE OF UTERUS  2001  . KNEE ARTHROSCOPY Left   . KNEE SURGERY     multiple left knee surgery (x4)  . MOUTH SURGERY  2016  . PLANTAR FASCIECTOMY Bilateral    03/2018, 07/2018    Outpatient Medications Prior to Visit  Medication Sig Dispense Refill  . calcium carbonate (TUMS EX) 750 MG chewable tablet Chew 2 tablets by mouth daily as needed for heartburn.    . Calcium-Magnesium-Vitamin D [phone] LIQD Take by mouth.    . Cholecalciferol (VITAMIN D) 125 MCG (5000 UT) CAPS Take 5,000 Units by mouth daily.    . Coenzyme Q10 (CO Q 10 PO) Take 1 capsule  by mouth daily.    . Multiple Vitamin (MULTIVITAMIN) capsule Take 1 capsule by mouth daily.    . Multiple Vitamins-Minerals (MULTIVITAMIN GUMMIES WOMENS) CHEW Chew 2 each by mouth daily.    . Omega-3 1000 MG CAPS Take 1 capsule by mouth daily.    . Probiotic Product (PROBIOTIC DAILY PO) Take 1 capsule by mouth daily.    . psyllium (METAMUCIL) 58.6 % packet Take 1 packet by mouth daily.    . vitamin C (ASCORBIC ACID) 500 MG tablet Take 500 mg by mouth daily.    . Calcium Carb-Cholecalciferol (CALCIUM-VITAMIN D) 600-400 MG-UNIT TABS Take 2 tablets by mouth every morning.     No facility-administered medications prior to visit.    Allergies  Allergen  Reactions  . Sulfa Antibiotics Hives    Family History  Problem Relation Age of Onset  . Diabetes Mother   . Hypertension Mother   . Hypertension Father   . Atrial fibrillation Father   . Skin cancer Father   . Hypertension Brother   . Hypertension Maternal Grandmother   . Heart disease Maternal Grandfather        heart attack  . Hypertension Paternal Grandmother   . Stroke Paternal Grandmother   . Liver cancer Paternal Grandfather   . Colon cancer Neg Hx   . Breast cancer Neg Hx   . Cervical cancer Neg Hx   . Ovarian cancer Neg Hx   . Endometrial cancer Neg Hx     Social History   Tobacco Use  . Smoking status: Never Smoker  . Smokeless tobacco: Never Used  Substance Use Topics  . Alcohol use: No  . Drug use: No    ROS: As per history of present illness, otherwise negative  BP 130/80   Pulse (!) 58   Ht 5\' 4"  (1.626 m)   Wt 191 lb (86.6 kg)   BMI 32.79 kg/m  Constitutional: Well-developed and well-nourished. No distress. HEENT: Normocephalic and atraumatic.  Conjunctivae are normal.  No scleral icterus. Cardiovascular: Normal rate, regular rhythm and intact distal pulses. No M/R/G Pulmonary/chest: Effort normal and breath sounds normal. No wheezing, rales or rhonchi. Abdominal: Soft, mild tenderness with deep palpation in the left lower quadrant without rebound or guarding, no obviously palpable hernia, nondistended. Bowel sounds active throughout. There are no masses palpable. No hepatosplenomegaly. Extremities: no clubbing, cyanosis, or edema Neurological: Alert and oriented to person place and time. Skin: Skin is warm and dry.  Psychiatric: Normal mood and affect. Behavior is normal.  RELEVANT LABS AND IMAGING: CBC    Component Value Date/Time   WBC 8.7 06/15/2019 0948   WBC 11.9 (H) 02/27/2019 0459   RBC 4.97 06/15/2019 0948   RBC 3.79 (L) 02/27/2019 0459   HGB 14.3 06/15/2019 0948   HCT 43.8 06/15/2019 0948   PLT 250 06/15/2019 0948   MCV 88  06/15/2019 0948   MCH 28.8 06/15/2019 0948   MCH 28.8 02/27/2019 0459   MCHC 32.6 06/15/2019 0948   MCHC 31.0 02/27/2019 0459   RDW 13.0 06/15/2019 0948   LYMPHSABS 1.5 10/25/2018 0625   MONOABS 1.4 (H) 10/25/2018 0625   EOSABS 0.0 10/25/2018 0625   BASOSABS 0.1 10/25/2018 0625    CMP     Component Value Date/Time   NA 140 06/15/2019 0948   K 4.8 06/15/2019 0948   CL 104 06/15/2019 0948   CO2 23 06/15/2019 0948   GLUCOSE 91 06/15/2019 0948   GLUCOSE 92 02/27/2019 0459   BUN 7  06/15/2019 0948   CREATININE 0.83 06/15/2019 0948   CREATININE 0.80 06/12/2015 1048   CALCIUM 9.8 06/15/2019 0948   PROT 7.6 06/15/2019 0948   ALBUMIN 4.6 06/15/2019 0948   AST 17 06/15/2019 0948   ALT 15 06/15/2019 0948   ALKPHOS 78 06/15/2019 0948   BILITOT 0.3 06/15/2019 0948   GFRNONAA 82 06/15/2019 0948   GFRAA 94 06/15/2019 0948     Procedure:  MRI Abdomen with and without contrast   Indication: Liver lesion, > 1cm, [place] nondiagnostic, K76.89 Other specified  diseases of liver   Comparison:  MRI abdomen 12/18/2018. MRI abdomen 04/16/2019   Technique: Precontrast and dynamic postcontrast MR imaging of the abdomen  was performed using the Liver Protocol. IV contrast was administered to  improve disease detection and further define anatomy.   Premedication/adverse events:  None.   Findings:   - Lower thorax: Normal heart size. No pulmonary consolidation. No pleural  effusion.   - Liver: Normal in morphology. Similar perfusion abnormalities in segment 7  and 8 without obvious vascular etiology. T1 hypointense, nonenhancing  lesion in hepatic segment 2/4 A measuring 2.5 x 2.0 cm, previously 2.6 x  2.1 cm (15/20). No enhancement, restricted diffusion, or intralesional fat.  The portal and hepatic veins are patent.   - Biliary and Gallbladder: No intrahepatic or extrahepatic bile duct  dilatation. The gallbladder contains gallstones.   - Spleen: Normal in appearance.    - Pancreas:  Normal in appearance.   - Adrenal Glands: Normal in appearance.   - Kidneys: No suspicious renal lesions. No hydronephrosis.   - Abdominal and Pelvic Vasculature: No abdominal aortic aneurysm.   - Gastrointestinal Tract: No abnormal dilation or wall thickening.   - Peritoneum/Mesentery/Retroperitoneum: No free fluid.   - Lymph Nodes: No abdominal lymphadenopathy.    - Body Wall: Unremarkable.   - Musculoskeletal:  No aggressive appearing osseous lesions.    Impression:   1. Stable left hepatic lobe lesion since 12/18/2018 compatible with benign  etiology such as a thrombosed hemangioma or involuting adenoma. No  concerning features.   Electronically Reviewed by:  Sharolyn Douglas, MD, [facility]  Electronically Reviewed on:  03/20/2020 4:11 PM   I have reviewed the images and concur with the above findings.   Electronically Signed by:  Keenan Bachelor, MD, [facility]  Electronically Signed on:  03/20/2020 4:35 PM   ASSESSMENT/PLAN: 55 year old female with a past medical history of complicated diverticulitis status post robotic assisted sigmoidectomy in December 2020 with Dr. Marcello Moores, history of left-sided hyperplastic colon polyps x2 at colonoscopy in 2019, fatty liver/NAFLD without NASH, 2.5 x 2 cm left hepatic lobe lesion (adenoma versus hemangioma) who is seen in consult at the request of Dr. Nolon Rod to evaluate left lower quadrant abdominal pain.  1. LLQ pain --her left lower quadrant pain is consistent with abdominal wall pain.  I do not feel an abdominal wall hernia though this question was raised given that she had a robotic/laparoscopic sigmoidectomy.  Pain is exertional and so there could possibly be some nerve entrapment involved as well.  This does not sound colonic to me given the nature and exacerbated factors.  She does not have issues with constipation or obstructive symptoms and thus I do not think that there is a problem with the sigmoid anastomosis. --I  recommended that she try Lidoderm patch and provided reassurance today --If pain worsens or continues that I would recommend she consult back with Dr. Marcello Moores who performed her sigmoid resection and  also that we consider cross-sectional imaging to evaluate the abdominal wall.  We could consider colonoscopy if pain changes in nature.  2.  Fatty liver --we again discussed fatty liver and she is following with [facility].  She does not have elevated liver enzymes and so reassurance provided.  She is well aware of the metabolic nature of fatty liver.  She should continue her goal of regular exercise as well as her dietary modifications/avoiding carbohydrates/processed foods.  Mediterranean diet recommended.  3.  Left liver lesion, 2 cm --adenoma versus hemangioma.  She will follow at [facility] in December and likely have reimaging in about a year.  No concern for aggressive lesion and certainly not in need of any surgical intervention at present.  She is off of OCPs in the event that this does represent an adenoma.  4.  CRC screening --hyperplastic, subcentimeter, left-sided polyps only in 2019.  Recall colonoscopy 10 years 2029      LO:[id number], Zoe A, [facility] [address] [place],  [place] [id number]

## 2020-07-04 ENCOUNTER — Ambulatory Visit: Payer: 59 | Admitting: Physician Assistant

## 2020-07-04 ENCOUNTER — Encounter: Payer: Self-pay | Admitting: Physician Assistant

## 2020-07-04 ENCOUNTER — Other Ambulatory Visit: Payer: Self-pay

## 2020-07-04 DIAGNOSIS — Z1283 Encounter for screening for malignant neoplasm of skin: Secondary | ICD-10-CM

## 2020-07-04 DIAGNOSIS — R61 Generalized hyperhidrosis: Secondary | ICD-10-CM

## 2020-07-04 DIAGNOSIS — L570 Actinic keratosis: Secondary | ICD-10-CM

## 2020-07-04 DIAGNOSIS — B070 Plantar wart: Secondary | ICD-10-CM

## 2020-07-04 DIAGNOSIS — D485 Neoplasm of uncertain behavior of skin: Secondary | ICD-10-CM

## 2020-07-04 DIAGNOSIS — L820 Inflamed seborrheic keratosis: Secondary | ICD-10-CM | POA: Diagnosis not present

## 2020-07-04 MED ORDER — DRYSOL 20 % EX SOLN: Freq: Every day | CUTANEOUS | 3 refills | Status: DC

## 2020-07-04 NOTE — Patient Instructions (Addendum)
Certin dry over the counter. Biopsy, Surgery (Curettage) & Surgery (Excision) Aftercare Instructions  1. Okay to remove bandage in 24 hours  2. Wash area with soap and water  3. Apply Vaseline to area twice daily until healed (Not Neosporin)  4. Okay to cover with a Band-Aid to decrease the chance of infection or prevent irritation from clothing; also it's okay to uncover lesion at home.  5. Suture instructions: return to our office in 7-10 or 10-14 days for a nurse visit for suture removal. Variable healing with sutures, if pain or itching occurs call our office. It's okay to shower or bathe 24 hours after sutures are given.  6. The following risks may occur after a biopsy, curettage or excision: bleeding, scarring, discoloration, recurrence, infection (redness, yellow drainage, pain or swelling).  7. For questions, concerns and results call our office at [place] before 4pm & Friday before 3pm. Biopsy results will be available in 1 week.

## 2020-07-16 ENCOUNTER — Telehealth: Payer: Self-pay

## 2020-07-16 ENCOUNTER — Encounter: Payer: Self-pay | Admitting: Physician Assistant

## 2020-07-16 NOTE — Telephone Encounter (Signed)
Phone call to patient with her pathology results. Patient aware of results.  

## 2020-07-16 NOTE — Telephone Encounter (Signed)
-----   Message from Warren Danes, [place] sent at 07/16/2020  2:03 PM EDT ----- RTC if recurs

## 2020-07-16 NOTE — Progress Notes (Signed)
Follow-Up Visit   Subjective  Sydney Alvarado is a 55 y.o. female who presents for the following: Annual Exam (Here for full body skin examination- new lesion on right temple and chest- red and then go away.  Possible warts on bottom of right foot- ln2 at last office visit.  No personal history of melanoma or non mole skin cancers.  Patient does state that dad has recently had several non mole skin cancers. ).   The following portions of the chart were reviewed this encounter and updated as appropriate:  Tobacco  Allergies  Meds  Problems  Med Hx  Surg Hx  Fam Hx      Objective  Well appearing patient in no apparent distress; mood and affect are within normal limits.  A full examination was performed including scalp, head, eyes, ears, nose, lips, neck, chest, axillae, abdomen, back, buttocks, bilateral upper extremities, bilateral lower extremities, hands, feet, fingers, toes, fingernails, and toenails. All findings within normal limits unless otherwise noted below.  Objective  Left Hand - Anterior, Left Middle Plantar Surface, Right Hand - Anterior, Right Middle Plantar Surface: Excessive sweating  Objective  Left chest: Hyperkeratotic scale with pink base      Objective  Right Temple: Erythematous patches with gritty scale.  Objective  Left Abdomen (side) - Upper (10): Erythematous stuck-on, plaques.   Objective  Right 2nd Metatarsal Plantar Area: Verrucous papule-- Discussed viral etiology and contagion.    Assessment & Plan  Hyperhidrosis (4) Left Hand - Anterior; Right Hand - Anterior; Left Middle Plantar Surface; Right Middle Plantar Surface    aluminum chloride (DRYSOL) 20 % external solution - Left Hand - Anterior, Left Middle Plantar Surface, Right Hand - Anterior, Right Middle Plantar Surface  Neoplasm of uncertain behavior of skin Left chest  Skin / nail biopsy Type of biopsy: tangential   Informed consent: discussed and consent obtained    Timeout: patient name, date of birth, surgical site, and procedure verified   Anesthesia: the lesion was anesthetized in a standard fashion   Anesthetic:  1% lidocaine w/ epinephrine 1-100,000 local infiltration Instrument used: flexible razor blade   Hemostasis achieved with: ferric subsulfate   Outcome: patient tolerated procedure well   Post-procedure details: wound care instructions given    Specimen 1 - Surgical pathology Differential Diagnosis: scc vs bcc  Check Margins: No  AK (actinic keratosis) Right Temple  Destruction of lesion - Right Temple Complexity: simple   Destruction method: cryotherapy   Informed consent: discussed and consent obtained   Timeout:  patient name, date of birth, surgical site, and procedure verified Lesion destroyed using liquid nitrogen: Yes   Cryotherapy cycles:  1 Outcome: patient tolerated procedure well with no complications   Post-procedure details: wound care instructions given    Inflamed seborrheic keratosis (10) Left Abdomen (side) - Upper  Destruction of lesion - Left Abdomen (side) - Upper Complexity: simple   Destruction method: cryotherapy   Informed consent: discussed and consent obtained   Timeout:  patient name, date of birth, surgical site, and procedure verified Lesion destroyed using liquid nitrogen: Yes   Cryotherapy cycles:  1 Outcome: patient tolerated procedure well with no complications   Post-procedure details: wound care instructions given    Plantar wart Right 2nd Metatarsal Plantar Area  Destruction of lesion - Right 2nd Metatarsal Plantar Area Complexity: simple   Destruction method: cryotherapy   Informed consent: discussed and consent obtained   Timeout:  patient name, date of birth, surgical site,  and procedure verified Lesion destroyed using liquid nitrogen: Yes   Cryotherapy cycles:  1 Outcome: patient tolerated procedure well with no complications   Post-procedure details: wound care instructions  given      I, Viki Carrera, PA-C, have reviewed all documentation's for this visit.  The documentation on 07/16/20 for the exam, diagnosis, procedures and orders are all accurate and complete.

## 2020-07-17 ENCOUNTER — Other Ambulatory Visit: Payer: Self-pay

## 2020-07-17 ENCOUNTER — Ambulatory Visit: Payer: 59 | Admitting: Podiatry

## 2020-07-17 DIAGNOSIS — R1013 Epigastric pain: Secondary | ICD-10-CM | POA: Insufficient documentation

## 2020-07-17 DIAGNOSIS — R933 Abnormal findings on diagnostic imaging of other parts of digestive tract: Secondary | ICD-10-CM | POA: Insufficient documentation

## 2020-07-17 DIAGNOSIS — K635 Polyp of colon: Secondary | ICD-10-CM | POA: Insufficient documentation

## 2020-07-17 DIAGNOSIS — B070 Plantar wart: Secondary | ICD-10-CM

## 2020-07-17 DIAGNOSIS — R194 Change in bowel habit: Secondary | ICD-10-CM | POA: Insufficient documentation

## 2020-07-17 DIAGNOSIS — L74513 Primary focal hyperhidrosis, soles: Secondary | ICD-10-CM | POA: Diagnosis not present

## 2020-07-17 DIAGNOSIS — Z1211 Encounter for screening for malignant neoplasm of colon: Secondary | ICD-10-CM | POA: Insufficient documentation

## 2020-07-17 DIAGNOSIS — K219 Gastro-esophageal reflux disease without esophagitis: Secondary | ICD-10-CM | POA: Insufficient documentation

## 2020-07-17 DIAGNOSIS — G8929 Other chronic pain: Secondary | ICD-10-CM | POA: Insufficient documentation

## 2020-07-17 MED ORDER — DIAZEPAM 5 MG PO TABS: 5.0000 mg | ORAL_TABLET | ORAL | 0 refills | Status: DC

## 2020-07-17 NOTE — Progress Notes (Signed)
  Subjective:  Patient ID: Sydney Alvarado, female    DOB: [birth year],  MRN: [id number]  Chief Complaint  Patient presents with  . Plantar Warts    Wart on bottom of foot that has been going on since last year. Has had a couple visits with derm and had cryo done however is still returning.     55 y.o. female presents with the above complaint. History confirmed with patient.  Also wondering about hyperhidrosis today.  She has hyperhidrosis in her feet and hands and these are quite symptomatic.  She has failed treatment with both Drysol and glycopyrrolate.  Glycopyrrolate was not tolerated and discontinued due to side effects of excessive dryness of mouth and headaches.  She is currently using Drysol but this does not help as it causes dryness and skin issues without adequately controlling the sweating in her feet. States this has caused anxiety about the sweating which makes her swelling worse and affected her personal and professional life by preventing her from wearing certain shoes comfortably.  Objective:  Physical Exam: warm, good capillary refill, normal DP and PT pulses and normal sensory exam. 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 Plantar skin xerotic, cracking.  Right plantar midfoot verruca with pain to palpation, central petechiae. Assessment:   1. Verruca plantaris   2. Hyperhidrosis of feet      Plan:  Patient was evaluated and treated and all questions answered.  Verruca plantar foot right -Debrided and destroyed as below  Procedure: Destruction of Lesion Location: right plantar midfoot NWB area Anesthesia: none Instrumentation: 15 blade. Technique: Debridement of lesion to petechial bleeding. Aperture pad applied around lesion. Small amount of canthrone applied to the base of the lesion. Dressing: Dry, sterile, compression dressing. Disposition: Patient tolerated procedure well. Advised to leave dressing on for 6-8 hours. Thereafter patient to wash  the area with soap and water and applied band-aid. Off-loading pads dispensed. Patient to return in 2 weeks for follow-up.   Hyperhidrosis -I think she is good candidate to try Botox injections given failure of glycopyrrolate and drysol.  We will prior authorize the medication for possible injection next visit.  Given Valium for preprocedure for patient comfort.  Return in about 6 weeks (around 08/28/2020) for Botox injections.

## 2020-07-24 ENCOUNTER — Encounter: Payer: Self-pay | Admitting: Podiatry

## 2020-07-25 IMAGING — MR MR ABDOMEN WO/W CM
11 of 17 series · 25 of 48 positions shown · IV contrast (multihance)
Comparison: No prior abdominal MRI. CT the abdomen and pelvis
10/25/2018.

CLINICAL DATA: 53-year-old female with history of indeterminate
liver lesion noted on prior CT examination. Follow-up study.

EXAM:
MRI ABDOMEN WITHOUT AND WITH CONTRAST
TECHNIQUE: Multiplanar multisequence MR imaging of the abdomen was performed
both before and after the administration of intravenous contrast.
CONTRAST:  17mL MULTIHANCE GADOBENATE DIMEGLUMINE 529 MG/ML IV SOLN

[Series 4: T2 · coronal · 5.0mm · 1.56mm/px · 1 of 30 slices shown (1 of 3)]
[im 1/30]
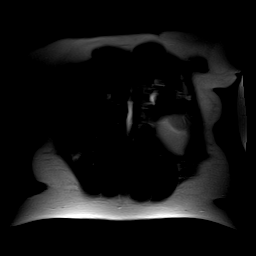

[Series 5: axial tru fisp · axial · 5.0mm · 1.41mm/px · 1 of 37 slices shown]
[im 1/37]
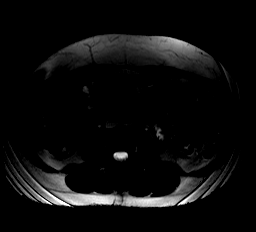

[Series 6: T2 · axial · 5.0mm · 1.37mm/px · 1 of 35 slices shown (2 of 3)]
[im 1/35]
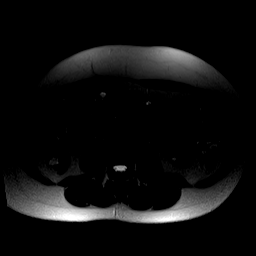

[Series 7: axial in out · axial · 6.0mm · 0.74mm/px · 1 of 60 slices shown]
[im 1/60]
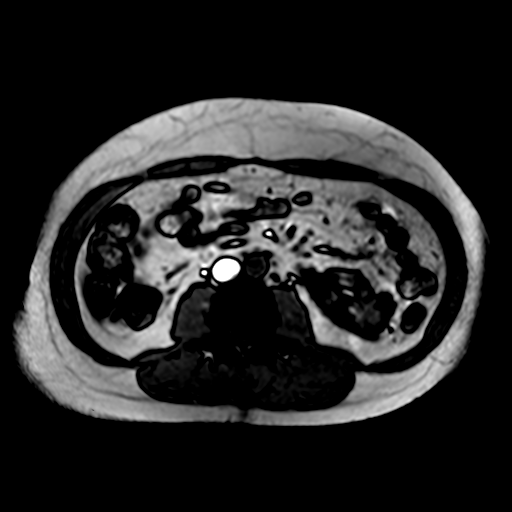

[Series 8: T2 · axial · 6.5mm · 0.74mm/px · 1 of 30 slices shown (3 of 3)]
[im 1/30]
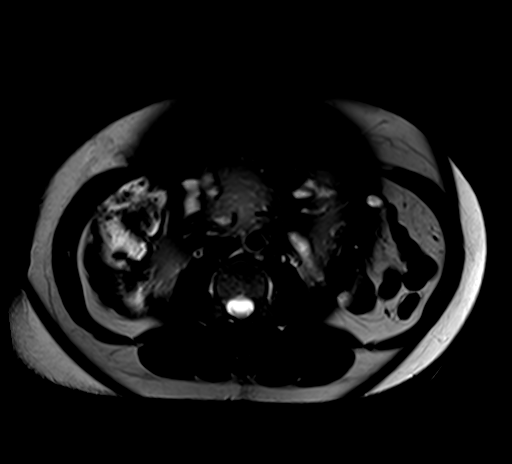

[Series 9: ep2d_[id number]_500_800_p2 · axial · 6.5mm · 1.98mm/px · z∈[-16,+211]mm · 3 of 90 slices shown]
[im 1/90]
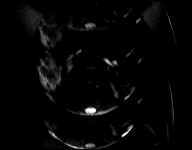
[im 45/90]
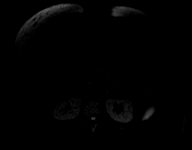
[im 90/90]
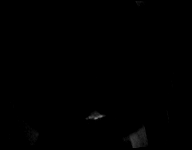

[Series 10: [id number]_[id number]_adc · axial · 6.5mm · 1.98mm/px · 1 of 30 slices shown]
[im 1/30]
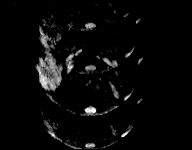

[Series 11: T1 dynamic · axial · non-contrast · 2.5mm · 0.78mm/px · z∈[-24,+194]mm · 4 of 88 slices shown]
[im 1/88]
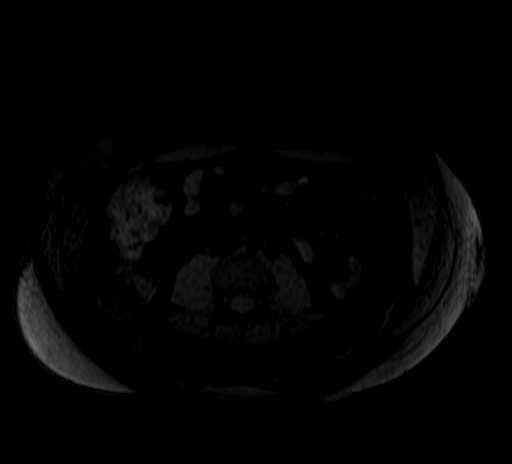
[im 30/88]
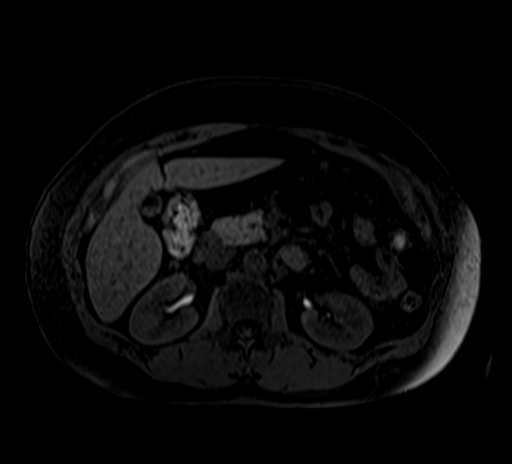
[im 59/88]
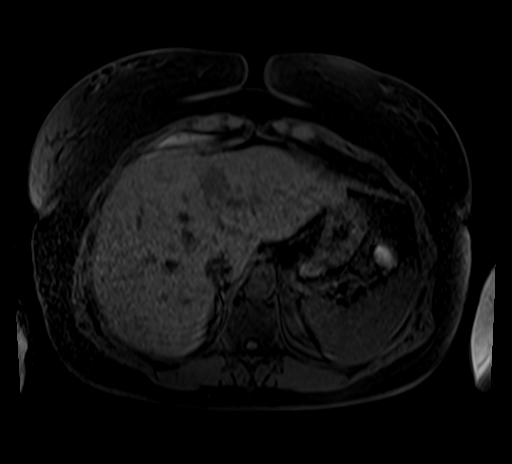
[im 88/88]
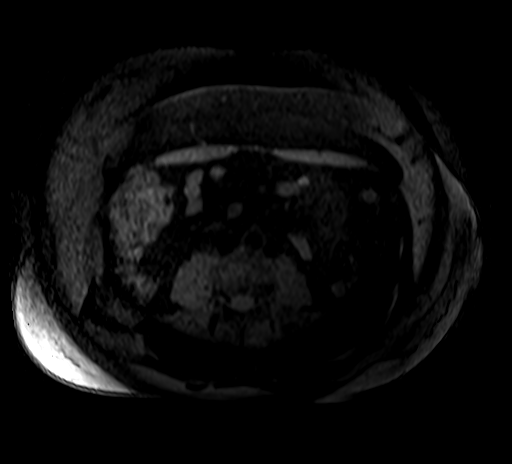

[Series 12: post 25 sec · axial · 2.5mm · 0.78mm/px · z∈[-24,+194]mm · 4 of 88 slices shown]
[im 1/88]
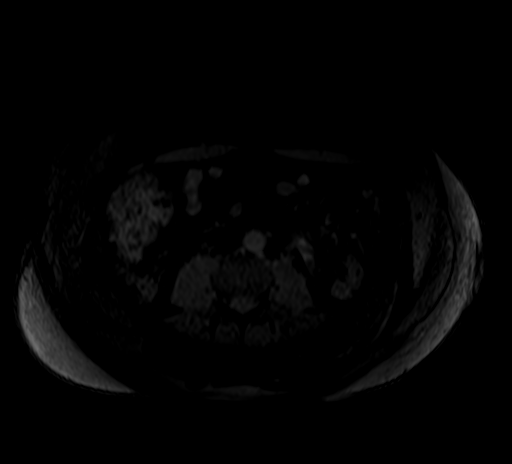
[im 30/88]
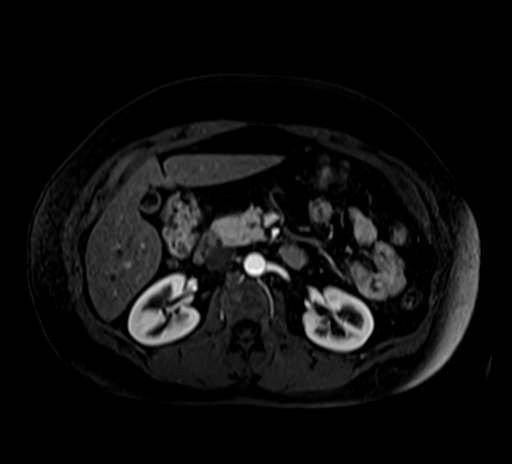
[im 59/88]
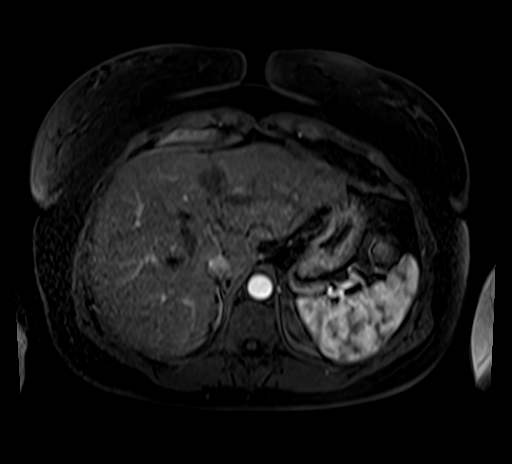
[im 88/88]
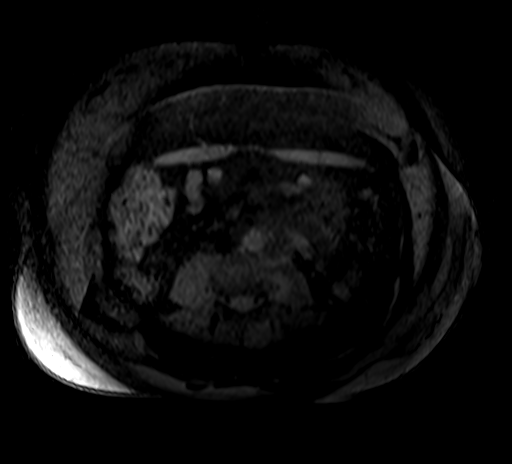

[Series 13: post 25 sec_sub · axial · 2.5mm · 0.78mm/px · z∈[-24,+194]mm · 4 of 88 slices shown]
[im 1/88]
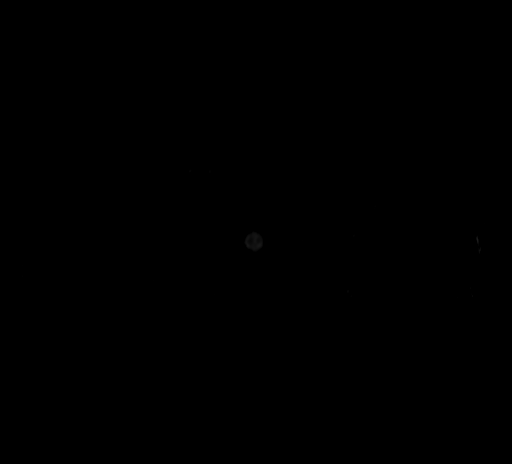
[im 30/88]
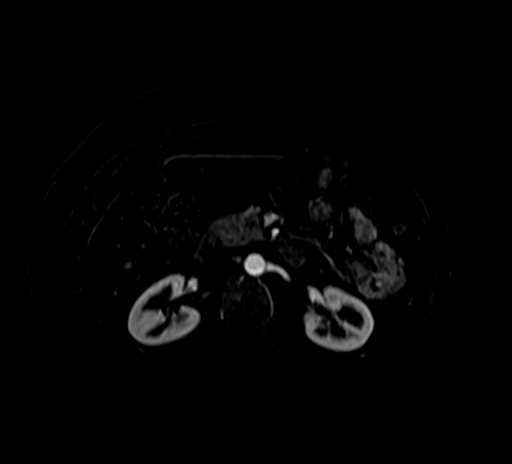
[im 59/88]
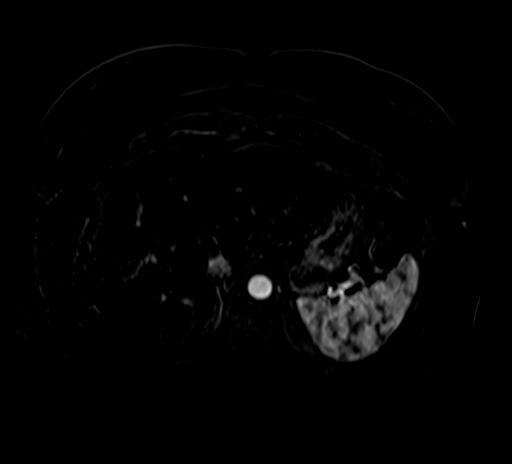
[im 88/88]
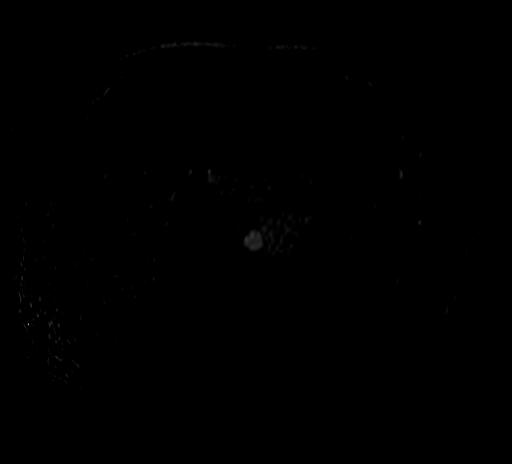

[Series 14: post 45 sec · axial · 2.5mm · 0.78mm/px · z∈[-24,+194]mm · 4 of 88 slices shown]
[im 1/88]
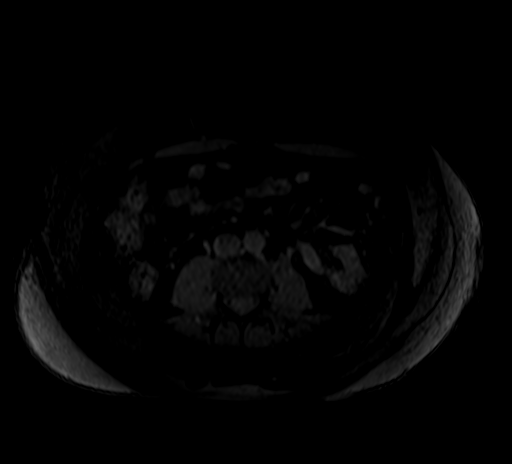
[im 30/88]
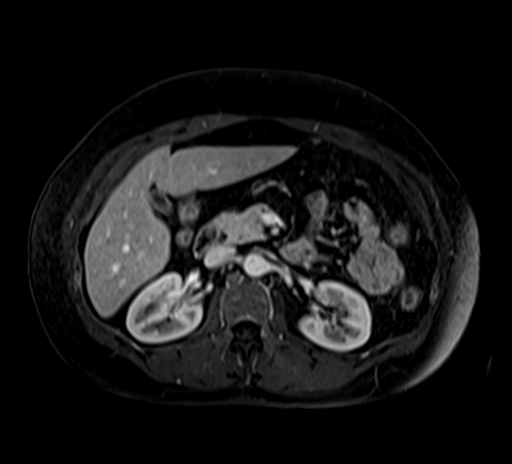
[im 59/88]
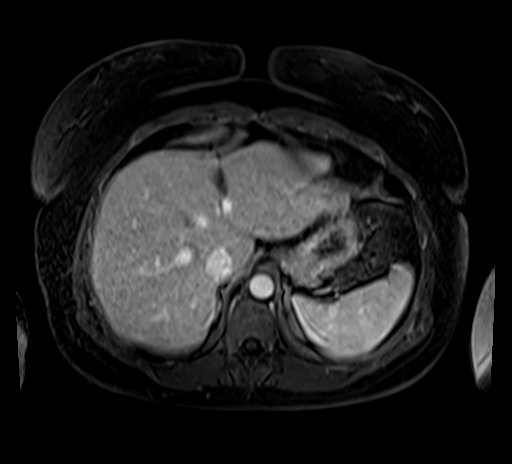
[im 88/88]
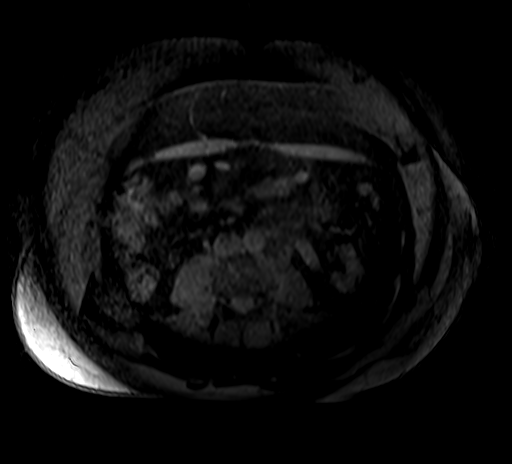

[25 of 48 positions shown; findings below may reference images not displayed]

FINDINGS: Lower chest: Unremarkable.

Hepatobiliary: Diffuse loss of signal intensity throughout the
hepatic parenchyma on out of phase dual echo images, indicative of
hepatic steatosis. The lesion of concern in the left lobe of the
liver is located between segments 2 and 4A. This lesion is best
appreciated on post gadolinium images and is estimated to measure
approximately 2.8 x 2.2 cm (axial image 34 of series 14),
demonstrating mild T1 hypointensity, minimal T2 hyperintensity
(apparent only on the fat saturated images), no definite diffusion
restriction, and a suggestion of some very mild progressive
peripheral predominant enhancement with centripetal filling. No
other suspicious hepatic lesions are noted. No intra or extrahepatic
biliary ductal dilatation. Gallbladder is normal in appearance.

Pancreas: No pancreatic mass. No pancreatic ductal dilatation. No
pancreatic or peripancreatic fluid collections or inflammatory
changes.

Spleen:  Unremarkable.

Adrenals/Urinary Tract: Bilateral kidneys and adrenal glands are
normal in appearance. No hydroureteronephrosis in the visualized
portions of the abdomen.

Stomach/Bowel: Visualized portions are unremarkable.

Vascular/Lymphatic: No aneurysm identified in the visualized
abdominal vasculature. No lymphadenopathy noted in the abdomen.

Other: No significant volume of ascites noted in the visualized
portions of the peritoneal cavity.

Musculoskeletal: No aggressive appearing osseous lesions are noted
in the visualized portions of the skeleton.
IMPRESSION: 1. The lesion of concern in the left lobe of the liver has
indeterminate imaging characteristics, but is favored to represent a
benign lesion, possibly an atypical hemangioma. Short-term interval
follow-up with repeat abdominal MRI with and without IV gadolinium
is recommended in 3 months to ensure the stability of this finding.
2. Hepatic steatosis.

## 2020-07-31 ENCOUNTER — Encounter: Payer: Self-pay | Admitting: Podiatry

## 2020-08-02 ENCOUNTER — Encounter: Payer: Self-pay | Admitting: Podiatry

## 2020-08-03 ENCOUNTER — Ambulatory Visit: Payer: 59 | Admitting: Podiatry

## 2020-09-04 ENCOUNTER — Ambulatory Visit: Payer: 59 | Admitting: Podiatry

## 2020-09-04 ENCOUNTER — Other Ambulatory Visit: Payer: Self-pay

## 2020-09-04 DIAGNOSIS — L74513 Primary focal hyperhidrosis, soles: Secondary | ICD-10-CM

## 2020-09-04 MED ORDER — ONABOTULINUMTOXINA 100 UNITS IJ SOLR
200.0000 [IU] | Freq: Once | INTRAMUSCULAR | Status: AC
  Administered 2020-09-04: 200 [IU] via INTRADERMAL

## 2020-09-04 NOTE — Progress Notes (Signed)
  Subjective:  Patient ID: Sydney Alvarado, female    DOB: 14-Apr-1965,  MRN: [id number]  Chief Complaint  Patient presents with   Follow-up    Denies any changes since last visit.     55 y.o. female presents with the above complaint. History confirmed with patient.   Objective:  Physical Exam: warm, good capillary refill, normal DP and PT pulses and normal sensory exam. 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 Plantar skin xerotic, cracking.  Right plantar midfoot verruca with pain to palpation, central petechiae. Assessment:   1. Hyperhidrosis of feet    Plan:  Patient was evaluated and treated and all questions answered.  Plantar Hyperhidrosis -Botox injections as below.  Procedure: Chemodenervation of Plantar Foot for Hyperhidrosis Location: Bilateral feet Skin Prep: Alcohol. Injectate: 100 units for each foot - 200 units total. Procedure in detail: Each foot was anesthetized with a total of 10 mL lidocaine 1% plain at the sural nerve and posterior tibial nerve to anesthetize the plantar surface of the foot.  Start iodine test was used to locate areas of maximal swelling.  A grid was drawn on the bottom of each foot.   Botox Lot [id number] expiration 07/2022 was reconstituted in 4 mL of saline per 100 unit vial. A total of 100 units was injected into each foot for a total of 200 units. There was no waste.  Disposition: Patient tolerated procedure well. Injection site dressed with a band-aid.   No follow-ups on file.

## 2020-09-25 ENCOUNTER — Ambulatory Visit: Payer: 59 | Admitting: Podiatry

## 2020-09-25 ENCOUNTER — Other Ambulatory Visit: Payer: Self-pay

## 2020-09-25 DIAGNOSIS — L74513 Primary focal hyperhidrosis, soles: Secondary | ICD-10-CM

## 2020-09-25 NOTE — Progress Notes (Signed)
  Subjective:  Patient ID: Sydney Alvarado, female    DOB: [birth year],  MRN: [id number]  Chief Complaint  Patient presents with   Excessive Sweating    Follow up Botox treatment of both feet. Pt states Botox did not work.2 days after injections pt feet was excessively sweating.    55 y.o. female presents with the above complaint. History confirmed with patient.   Objective:  Physical Exam: warm, good capillary refill, normal DP and PT pulses and normal sensory exam. 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 Plantar skin xerotic, cracking.  Right plantar midfoot verruca with pain to palpation, central petechiae. Assessment:   1. Hyperhidrosis of feet    Plan:  Patient was evaluated and treated and all questions answered.  Plantar Hyperhidrosis -She has severe hyperhidrosis today. Distribution does suggest that the bottom is improved c/t prior. I think that the anti-anxiety medication last visit did significantly reduce her sweating. Will d/c this for next visit so we can better target therapy. -Patient open to repeat application. Trial in 6 weeks.  Return in about 6 weeks (around 11/06/2020).

## 2020-11-02 ENCOUNTER — Ambulatory Visit: Payer: 59 | Admitting: Podiatry

## 2020-11-02 ENCOUNTER — Other Ambulatory Visit: Payer: Self-pay

## 2020-11-02 DIAGNOSIS — L74513 Primary focal hyperhidrosis, soles: Secondary | ICD-10-CM | POA: Diagnosis not present

## 2020-11-02 MED ORDER — ONABOTULINUMTOXINA 100 UNITS IJ SOLR
200.0000 [IU] | Freq: Once | INTRAMUSCULAR | Status: AC
  Administered 2020-11-02: 200 [IU] via INTRAMUSCULAR

## 2020-11-02 NOTE — Progress Notes (Signed)
  Subjective:  Patient ID: Sydney Alvarado, female    DOB: Jun 06, 1965,  MRN: [id number]  Chief Complaint  Patient presents with   Foot Problem    Bilateral botox injections. Pt states previous injections did not work like she thought they would.    55 y.o. female presents with the above complaint. History confirmed with patient.   Objective:  Physical Exam: warm, good capillary refill, normal DP and PT pulses and normal sensory exam. 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 Plantar skin xerotic, cracking.  Assessment:   1. Hyperhidrosis of feet     Plan:  Patient was evaluated and treated and all questions answered.  Plantar Hyperhidrosis -Repeat Botox injections for hyperhidrosis  Procedure: Chemodenervation of Plantar Foot for Hyperhidrosis Location: Bilateral feet Skin Prep: Alcohol. Injectate: 100 units for each foot - 200 units total. Procedure in detail: Each foot was anesthetized with a total of 10 mL lidocaine 1% plain at the sural nerve and posterior tibial nerve to anesthetize the plantar surface of the foot.  Start iodine test was used to locate areas of maximal swelling.  A grid was drawn on the bottom of each foot.   Botox Lot [id number] expiration 11/2021 (both vials) was reconstituted in 4 mL of saline per 100 unit vial. A total of 100 units was injected into each foot for a total of 200 units. There was no waste.  Disposition: Patient tolerated procedure well. Injection site dressed with a band-aid.  No follow-ups on file.

## 2020-11-13 ENCOUNTER — Encounter: Payer: Self-pay | Admitting: Podiatry

## 2020-11-27 ENCOUNTER — Ambulatory Visit: Payer: 59 | Admitting: Podiatry

## 2021-01-01 ENCOUNTER — Ambulatory Visit: Payer: Self-pay | Admitting: General Surgery

## 2021-01-01 NOTE — H&P (Signed)
History of Present Illness: Sydney Alvarado is a 55 y.o. female who is seen today for L trunk mass.   I have known her previously from colon surgery.  She had a lipoma at that time and it was asymptomatic.  We have decided to monitor this.  She reports that the lipoma appears to be growing in size.  It is becoming painful to lie on that side.  She would like to be evaluated for excision.       Past Medical History:  Diagnosis Date   Allergy    Aortic atherosclerosis ([place]) 04/16/2019   Cholelithiasis 04/16/2019   Diverticulitis 10/2018   perforated   Hepatic steatosis    Hyperhidrosis    Liver lesion    PONV (postoperative nausea and vomiting)    Sigmoid diverticulosis    Past Surgical History:  Procedure Laterality Date   ANTERIOR CRUCIATE LIGAMENT REPAIR     DILATION AND CURETTAGE OF UTERUS  2001   KNEE ARTHROSCOPY Left    KNEE SURGERY     multiple left knee surgery (x4)   MOUTH SURGERY  2016   PLANTAR FASCIECTOMY Bilateral    03/2018, 07/2018   Family History  Problem Relation Age of Onset   Diabetes Mother    Hypertension Mother    Hypertension Father    Atrial fibrillation Father    Skin cancer Father    Hypertension Brother    Hypertension Maternal Grandmother    Heart disease Maternal Grandfather        heart attack   Hypertension Paternal Grandmother    Stroke Paternal Grandmother    Liver cancer Paternal Grandfather    Colon cancer Neg Hx    Breast cancer Neg Hx    Cervical cancer Neg Hx    Ovarian cancer Neg Hx    Endometrial cancer Neg Hx    Social History   Socioeconomic History   Marital status: Married    Spouse name: Not on file   Number of children: Not on file   Years of education: Not on file   Highest education level: Not on file  Occupational History   Occupation: Pharmacist, community  Tobacco Use   Smoking status: Never   Smokeless tobacco: Never  Substance and Sexual Activity   Alcohol use: No   Drug use: No   Sexual activity: Yes  Other  Topics Concern   Not on file  Social History Narrative   Marital status: married x 29 years.      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      Seatbelt: 100%; no texting   Social Determinants of Radio broadcast assistant Strain: Not on file  Food Insecurity: Not on file  Transportation Needs: Not on file  Physical Activity: Not on file  Stress: Not on file  Social Connections: Not on file  Intimate Partner Violence: Not on file    Current Outpatient Medications:    aluminum chloride (DRYSOL) 20 % external solution, Apply topically at bedtime., Disp: 35 mL, Rfl: 3   calcium carbonate (TUMS EX)  750 MG chewable tablet, Chew 2 tablets by mouth daily as needed for heartburn., Disp: , Rfl:    Calcium-Magnesium-Vitamin D 600-300-400 LIQD, Take by mouth., Disp: , Rfl:    Cholecalciferol (VITAMIN D) 125 MCG (5000 UT) CAPS, Take 5,000 Units by mouth daily., Disp: , Rfl:    Coenzyme Q10 (CO Q 10 PO), Take 1 capsule by mouth daily., Disp: , Rfl:    COVID-19 Specimen Collection KIT, TEST AS DIRECTED TODAY, Disp: , Rfl:    diazepam (VALIUM) 5 MG tablet, Take by mouth., Disp: , Rfl:    diazepam (VALIUM) 5 MG tablet, Take 1 tablet (5 mg total) by mouth 30 (thirty) minutes before procedure for 1 dose., Disp: 2 tablet, Rfl: 0   Multiple Vitamin (MULTIVITAMIN) capsule, Take 1 capsule by mouth daily., Disp: , Rfl:    Multiple Vitamins-Minerals (MULTIVITAMIN GUMMIES WOMENS) CHEW, Chew 2 each by mouth daily., Disp: , Rfl:    Omega-3 1000 MG CAPS, Take 1 capsule by mouth daily., Disp: , Rfl:    Probiotic Product (PROBIOTIC DAILY PO), Take 1 capsule by mouth daily., Disp: , Rfl:    psyllium (METAMUCIL) 58.6 % packet, Take 1 packet by mouth  daily., Disp: , Rfl:    vitamin C (ASCORBIC ACID) 500 MG tablet, Take 500 mg by mouth daily., Disp: , Rfl:   Allergies  Allergen Reactions   Sulfa Antibiotics Hives   Review of Systems - Negative except as stated in HPI   Objective:         Vitals:    01/01/21 1135  BP: 128/82  Pulse: 68  Temp: 36.6 C (97.9 F)  SpO2: 98%  Weight: 88.6 kg (195 lb 6.4 oz)  Height: 162.6 cm (5' 4" )        General appearance - alert, well appearing, and in no distress CV: RRR Lungs:CTA Abd: soft Skin - There is an approximately 6 cm subcutaneous mass noted in the posterior axillary region.  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  I have recommended excisional biopsy.  We have discussed the typical risk associated with this procedure which include bleeding, damage to adjacent structures, recurrence and infection.  I believe all of these risks are very low.  She has elected to proceed.

## 2021-01-01 NOTE — H&P (View-Only) (Signed)
History of Present Illness: Sydney Alvarado is a 55 y.o. female who is seen today for L trunk mass.   I have known her previously from colon surgery.  She had a lipoma at that time and it was asymptomatic.  We have decided to monitor this.  She reports that the lipoma appears to be growing in size.  It is becoming painful to lie on that side.  She would like to be evaluated for excision.       Past Medical History:  Diagnosis Date   Allergy    Aortic atherosclerosis ([place]) 04/16/2019   Cholelithiasis 04/16/2019   Diverticulitis 10/2018   perforated   Hepatic steatosis    Hyperhidrosis    Liver lesion    PONV (postoperative nausea and vomiting)    Sigmoid diverticulosis    Past Surgical History:  Procedure Laterality Date   ANTERIOR CRUCIATE LIGAMENT REPAIR     DILATION AND CURETTAGE OF UTERUS  2001   KNEE ARTHROSCOPY Left    KNEE SURGERY     multiple left knee surgery (x4)   MOUTH SURGERY  2016   PLANTAR FASCIECTOMY Bilateral    03/2018, 07/2018   Family History  Problem Relation Age of Onset   Diabetes Mother    Hypertension Mother    Hypertension Father    Atrial fibrillation Father    Skin cancer Father    Hypertension Brother    Hypertension Maternal Grandmother    Heart disease Maternal Grandfather        heart attack   Hypertension Paternal Grandmother    Stroke Paternal Grandmother    Liver cancer Paternal Grandfather    Colon cancer Neg Hx    Breast cancer Neg Hx    Cervical cancer Neg Hx    Ovarian cancer Neg Hx    Endometrial cancer Neg Hx    Social History   Socioeconomic History   Marital status: Married    Spouse name: Not on file   Number of children: Not on file   Years of education: Not on file   Highest education level: Not on file  Occupational History   Occupation: Pharmacist, community  Tobacco Use   Smoking status: Never   Smokeless tobacco: Never  Substance and Sexual Activity   Alcohol use: No   Drug use: No   Sexual activity: Yes  Other  Topics Concern   Not on file  Social History Narrative   Marital status: married x 29 years.      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      Seatbelt: 100%; no texting   Social Determinants of Radio broadcast assistant Strain: Not on file  Food Insecurity: Not on file  Transportation Needs: Not on file  Physical Activity: Not on file  Stress: Not on file  Social Connections: Not on file  Intimate Partner Violence: Not on file    Current Outpatient Medications:    aluminum chloride (DRYSOL) 20 % external solution, Apply topically at bedtime., Disp: 35 mL, Rfl: 3   calcium carbonate (TUMS EX)  750 MG chewable tablet, Chew 2 tablets by mouth daily as needed for heartburn., Disp: , Rfl:    Calcium-Magnesium-Vitamin D 600-300-400 LIQD, Take by mouth., Disp: , Rfl:    Cholecalciferol (VITAMIN D) 125 MCG (5000 UT) CAPS, Take 5,000 Units by mouth daily., Disp: , Rfl:    Coenzyme Q10 (CO Q 10 PO), Take 1 capsule by mouth daily., Disp: , Rfl:    COVID-19 Specimen Collection KIT, TEST AS DIRECTED TODAY, Disp: , Rfl:    diazepam (VALIUM) 5 MG tablet, Take by mouth., Disp: , Rfl:    diazepam (VALIUM) 5 MG tablet, Take 1 tablet (5 mg total) by mouth 30 (thirty) minutes before procedure for 1 dose., Disp: 2 tablet, Rfl: 0   Multiple Vitamin (MULTIVITAMIN) capsule, Take 1 capsule by mouth daily., Disp: , Rfl:    Multiple Vitamins-Minerals (MULTIVITAMIN GUMMIES WOMENS) CHEW, Chew 2 each by mouth daily., Disp: , Rfl:    Omega-3 1000 MG CAPS, Take 1 capsule by mouth daily., Disp: , Rfl:    Probiotic Product (PROBIOTIC DAILY PO), Take 1 capsule by mouth daily., Disp: , Rfl:    psyllium (METAMUCIL) 58.6 % packet, Take 1 packet by mouth  daily., Disp: , Rfl:    vitamin C (ASCORBIC ACID) 500 MG tablet, Take 500 mg by mouth daily., Disp: , Rfl:   Allergies  Allergen Reactions   Sulfa Antibiotics Hives   Review of Systems - Negative except as stated in HPI   Objective:         Vitals:    01/01/21 1135  BP: 128/82  Pulse: 68  Temp: 36.6 C (97.9 F)  SpO2: 98%  Weight: 88.6 kg (195 lb 6.4 oz)  Height: 162.6 cm (5' 4" )        General appearance - alert, well appearing, and in no distress CV: RRR Lungs:CTA Abd: soft Skin - There is an approximately 6 cm subcutaneous mass noted in the posterior axillary region.  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  I have recommended excisional biopsy.  We have discussed the typical risk associated with this procedure which include bleeding, damage to adjacent structures, recurrence and infection.  I believe all of these risks are very low.  She has elected to proceed.

## 2021-01-25 ENCOUNTER — Other Ambulatory Visit: Payer: Self-pay

## 2021-01-25 ENCOUNTER — Encounter (HOSPITAL_BASED_OUTPATIENT_CLINIC_OR_DEPARTMENT_OTHER): Payer: Self-pay | Admitting: General Surgery

## 2021-01-25 NOTE — Progress Notes (Signed)
Spoke w/ via phone for pre-op interview---pt  Lab needs dos----  UPT             Lab results------none COVID test -----patient states asymptomatic no test needed Arrive at -------0800 01/30/2021 NPO after MN NO Solid Food.  Clear liquids from MN until---0700, 01/30/2021 Med rec completed Medications to take morning of surgery -----probiotic, Zyrtec, MV, Vit D,Vit C, coenzyme. Diabetic medication -----n/a Patient instructed no nail polish to be worn day of surgery Patient instructed to bring photo id and insurance card day of surgery Patient aware to have Driver (ride ) / caregiver husband tim    for 24 hours after surgery  Patient Special Instructions -----none Pre-Op special Istructions -----none Patient verbalized understanding of instructions that were given at this phone interview. Patient denies shortness of breath, chest pain, fever, cough at this phone interview.

## 2021-01-30 ENCOUNTER — Other Ambulatory Visit: Payer: Self-pay

## 2021-01-30 ENCOUNTER — Ambulatory Visit (HOSPITAL_BASED_OUTPATIENT_CLINIC_OR_DEPARTMENT_OTHER)
Admission: RE | Admit: 2021-01-30 | Discharge: 2021-01-30 | Disposition: A | Discharge status: Home / Self Care | Payer: 59 | Attending: General Surgery | Admitting: General Surgery

## 2021-01-30 ENCOUNTER — Encounter (HOSPITAL_BASED_OUTPATIENT_CLINIC_OR_DEPARTMENT_OTHER): Payer: Self-pay | Admitting: General Surgery

## 2021-01-30 ENCOUNTER — Encounter (HOSPITAL_BASED_OUTPATIENT_CLINIC_OR_DEPARTMENT_OTHER)
Admission: RE | Disposition: A | Discharge status: Home / Self Care | Payer: Self-pay | Source: Home / Self Care | Attending: General Surgery

## 2021-01-30 ENCOUNTER — Ambulatory Visit (HOSPITAL_BASED_OUTPATIENT_CLINIC_OR_DEPARTMENT_OTHER): Payer: 59 | Admitting: Anesthesiology

## 2021-01-30 DIAGNOSIS — K219 Gastro-esophageal reflux disease without esophagitis: Secondary | ICD-10-CM | POA: Insufficient documentation

## 2021-01-30 DIAGNOSIS — D171 Benign lipomatous neoplasm of skin and subcutaneous tissue of trunk: Secondary | ICD-10-CM | POA: Diagnosis not present

## 2021-01-30 DIAGNOSIS — R222 Localized swelling, mass and lump, trunk: Secondary | ICD-10-CM | POA: Diagnosis present

## 2021-01-30 HISTORY — DX: Prediabetes: R73.03

## 2021-01-30 HISTORY — DX: COVID-19: U07.1

## 2021-01-30 HISTORY — DX: Unspecified osteoarthritis, unspecified site: M19.90

## 2021-01-30 HISTORY — DX: Presence of spectacles and contact lenses: Z97.3

## 2021-01-30 HISTORY — PX: MASS EXCISION: SHX2000

## 2021-01-30 LAB — POCT PREGNANCY, URINE: Preg Test, Ur: NEGATIVE

## 2021-01-30 SURGERY — EXCISION MASS
Anesthesia: Monitor Anesthesia Care | Site: Axilla | Laterality: Left

## 2021-01-30 MED ORDER — MIDAZOLAM HCL 5 MG/5ML IJ SOLN
INTRAMUSCULAR | Status: DC | PRN
  Administered 2021-01-30: 2 mg via INTRAVENOUS
  Administered 2021-01-30 (×2): 1 mg via INTRAVENOUS

## 2021-01-30 MED ORDER — OXYCODONE HCL 5 MG/5ML PO SOLN: 5.0000 mg | Freq: Once | ORAL | Status: DC | PRN

## 2021-01-30 MED ORDER — ONDANSETRON HCL 4 MG/2ML IJ SOLN
INTRAMUSCULAR | Status: DC | PRN
  Administered 2021-01-30: 4 mg via INTRAVENOUS

## 2021-01-30 MED ORDER — HYDROCODONE-ACETAMINOPHEN 5-325 MG PO TABS: 1.0000 | ORAL_TABLET | Freq: Four times a day (QID) | ORAL | 0 refills | Status: DC | PRN

## 2021-01-30 MED ORDER — FENTANYL CITRATE (PF) 100 MCG/2ML IJ SOLN
INTRAMUSCULAR | Status: AC
  Filled 2021-01-30: qty 2

## 2021-01-30 MED ORDER — CEFAZOLIN SODIUM-DEXTROSE 2-4 GM/100ML-% IV SOLN
2.0000 g | INTRAVENOUS | Status: AC
  Administered 2021-01-30: 2 g via INTRAVENOUS

## 2021-01-30 MED ORDER — PROPOFOL 1000 MG/100ML IV EMUL
INTRAVENOUS | Status: AC
  Filled 2021-01-30: qty 100

## 2021-01-30 MED ORDER — DEXAMETHASONE SODIUM PHOSPHATE 4 MG/ML IJ SOLN
INTRAMUSCULAR | Status: DC | PRN
  Administered 2021-01-30: 5 mg via INTRAVENOUS

## 2021-01-30 MED ORDER — PROPOFOL 500 MG/50ML IV EMUL
INTRAVENOUS | Status: AC
  Filled 2021-01-30: qty 50

## 2021-01-30 MED ORDER — LACTATED RINGERS IV SOLN: INTRAVENOUS | Status: DC

## 2021-01-30 MED ORDER — OXYCODONE HCL 5 MG PO TABS: 5.0000 mg | ORAL_TABLET | Freq: Once | ORAL | Status: DC | PRN

## 2021-01-30 MED ORDER — ACETAMINOPHEN 10 MG/ML IV SOLN: 1000.0000 mg | Freq: Once | INTRAVENOUS | Status: DC | PRN

## 2021-01-30 MED ORDER — CEFAZOLIN SODIUM-DEXTROSE 2-4 GM/100ML-% IV SOLN
INTRAVENOUS | Status: AC
  Filled 2021-01-30: qty 100

## 2021-01-30 MED ORDER — SODIUM CHLORIDE 0.9% FLUSH: 3.0000 mL | Freq: Two times a day (BID) | INTRAVENOUS | Status: DC

## 2021-01-30 MED ORDER — PROPOFOL 500 MG/50ML IV EMUL
INTRAVENOUS | Status: DC | PRN
  Administered 2021-01-30: 100 ug/kg/min via INTRAVENOUS

## 2021-01-30 MED ORDER — ACETAMINOPHEN 325 MG PO TABS: 650.0000 mg | ORAL_TABLET | ORAL | Status: DC | PRN

## 2021-01-30 MED ORDER — KETOROLAC TROMETHAMINE 30 MG/ML IJ SOLN
INTRAMUSCULAR | Status: DC | PRN
  Administered 2021-01-30: 30 mg via INTRAVENOUS

## 2021-01-30 MED ORDER — ACETAMINOPHEN 325 MG RE SUPP: 650.0000 mg | RECTAL | Status: DC | PRN

## 2021-01-30 MED ORDER — KETOROLAC TROMETHAMINE 30 MG/ML IJ SOLN
INTRAMUSCULAR | Status: AC
  Filled 2021-01-30: qty 1

## 2021-01-30 MED ORDER — SODIUM CHLORIDE 0.9 % IV SOLN: 250.0000 mL | INTRAVENOUS | Status: DC | PRN

## 2021-01-30 MED ORDER — MIDAZOLAM HCL 2 MG/2ML IJ SOLN
INTRAMUSCULAR | Status: AC
  Filled 2021-01-30: qty 2

## 2021-01-30 MED ORDER — FENTANYL CITRATE (PF) 100 MCG/2ML IJ SOLN
INTRAMUSCULAR | Status: DC | PRN
  Administered 2021-01-30 (×4): 25 ug via INTRAVENOUS

## 2021-01-30 MED ORDER — BUPIVACAINE-EPINEPHRINE 0.5% -1:200000 IJ SOLN
INTRAMUSCULAR | Status: DC | PRN
  Administered 2021-01-30: 35 mL

## 2021-01-30 MED ORDER — ACETAMINOPHEN 160 MG/5ML PO SOLN: 325.0000 mg | ORAL | Status: DC | PRN

## 2021-01-30 MED ORDER — OXYCODONE HCL 5 MG PO TABS: 5.0000 mg | ORAL_TABLET | ORAL | Status: DC | PRN

## 2021-01-30 MED ORDER — ACETAMINOPHEN 325 MG PO TABS: 325.0000 mg | ORAL_TABLET | ORAL | Status: DC | PRN

## 2021-01-30 MED ORDER — ACETAMINOPHEN 500 MG PO TABS
1000.0000 mg | ORAL_TABLET | ORAL | Status: AC
  Administered 2021-01-30: 1000 mg via ORAL

## 2021-01-30 MED ORDER — SODIUM CHLORIDE 0.9% FLUSH: 3.0000 mL | INTRAVENOUS | Status: DC | PRN

## 2021-01-30 MED ORDER — PROMETHAZINE HCL 25 MG/ML IJ SOLN: 6.2500 mg | INTRAMUSCULAR | Status: DC | PRN

## 2021-01-30 MED ORDER — DEXAMETHASONE SODIUM PHOSPHATE 10 MG/ML IJ SOLN
INTRAMUSCULAR | Status: AC
  Filled 2021-01-30: qty 1

## 2021-01-30 MED ORDER — ACETAMINOPHEN 500 MG PO TABS
ORAL_TABLET | ORAL | Status: AC
  Filled 2021-01-30: qty 2

## 2021-01-30 MED ORDER — FENTANYL CITRATE (PF) 100 MCG/2ML IJ SOLN: 25.0000 ug | INTRAMUSCULAR | Status: DC | PRN

## 2021-01-30 MED ORDER — AMISULPRIDE (ANTIEMETIC) 5 MG/2ML IV SOLN: 10.0000 mg | Freq: Once | INTRAVENOUS | Status: DC | PRN

## 2021-01-30 MED ORDER — ONDANSETRON HCL 4 MG/2ML IJ SOLN
INTRAMUSCULAR | Status: AC
  Filled 2021-01-30: qty 2

## 2021-01-30 MED ORDER — 0.9 % SODIUM CHLORIDE (POUR BTL) OPTIME
TOPICAL | Status: DC | PRN
  Administered 2021-01-30: 500 mL

## 2021-01-30 SURGICAL SUPPLY — 49 items
ADH SKN CLS APL DERMABOND .7 (GAUZE/BANDAGES/DRESSINGS) ×1
APL PRP STRL LF DISP 70% ISPRP (MISCELLANEOUS) ×1
APL SKNCLS STERI-STRIP NONHPOA (GAUZE/BANDAGES/DRESSINGS)
BENZOIN TINCTURE PRP APPL 2/3 (GAUZE/BANDAGES/DRESSINGS) IMPLANT
BLADE CLIPPER SENSICLIP SURGIC (BLADE) IMPLANT
BLADE EXTENDED COATED 6.5IN (ELECTRODE) IMPLANT
BLADE SURG 10 STRL SS (BLADE) ×3 IMPLANT
CHLORAPREP W/TINT 26 (MISCELLANEOUS) ×3 IMPLANT
CLOSURE WOUND 1/2 X4 (GAUZE/BANDAGES/DRESSINGS)
COVER BACK TABLE 60X90IN (DRAPES) ×3 IMPLANT
COVER MAYO STAND STRL (DRAPES) ×3 IMPLANT
DECANTER SPIKE VIAL GLASS SM (MISCELLANEOUS) IMPLANT
DERMABOND ADVANCED (GAUZE/BANDAGES/DRESSINGS) ×2
DERMABOND ADVANCED .7 DNX12 (GAUZE/BANDAGES/DRESSINGS) IMPLANT
DRAPE LAPAROTOMY 100X72 PEDS (DRAPES) ×3 IMPLANT
DRAPE UTILITY XL STRL (DRAPES) ×3 IMPLANT
DRSG TEGADERM 4X4.75 (GAUZE/BANDAGES/DRESSINGS) IMPLANT
ELECT REM PT RETURN 9FT ADLT (ELECTROSURGICAL) ×3
ELECTRODE REM PT RTRN 9FT ADLT (ELECTROSURGICAL) ×1 IMPLANT
GAUZE 4X4 16PLY ~~LOC~~+RFID DBL (SPONGE) ×3 IMPLANT
GAUZE SPONGE 4X4 12PLY STRL (GAUZE/BANDAGES/DRESSINGS) ×3 IMPLANT
GLOVE SURG ENC MOIS LTX SZ6.5 (GLOVE) ×6 IMPLANT
GLOVE SURG UNDER LTX SZ6.5 (GLOVE) ×3 IMPLANT
GLOVE SURG UNDER POLY LF SZ7.5 (GLOVE) ×2 IMPLANT
KIT TURNOVER CYSTO (KITS) ×3 IMPLANT
NEEDLE HYPO 22GX1.5 SAFETY (NEEDLE) ×3 IMPLANT
NS IRRIG 500ML POUR BTL (IV SOLUTION) ×2 IMPLANT
PACK BASIN DAY SURGERY FS (CUSTOM PROCEDURE TRAY) ×3 IMPLANT
PAD ARMBOARD 7.5X6 YLW CONV (MISCELLANEOUS) IMPLANT
PENCIL SMOKE EVACUATOR (MISCELLANEOUS) ×3 IMPLANT
STRIP CLOSURE SKIN 1/2X4 (GAUZE/BANDAGES/DRESSINGS) IMPLANT
SUT ETHILON 2 0 FS 18 (SUTURE) IMPLANT
SUT ETHILON 4 0 PS 2 18 (SUTURE) IMPLANT
SUT SILK 2 0 SH (SUTURE) IMPLANT
SUT VIC AB 2-0 SH 27 (SUTURE) ×3
SUT VIC AB 2-0 SH 27XBRD (SUTURE) IMPLANT
SUT VIC AB 3-0 SH 18 (SUTURE) IMPLANT
SUT VIC AB 4-0 PS2 18 (SUTURE) IMPLANT
SUT VIC AB 4-0 SH 18 (SUTURE) IMPLANT
SWAB CULTURE ESWAB REG 1ML (MISCELLANEOUS) IMPLANT
SYR BULB IRRIG 60ML STRL (SYRINGE) ×3 IMPLANT
SYR CONTROL 10ML LL (SYRINGE) ×3 IMPLANT
TOWEL OR 17X26 10 PK STRL BLUE (TOWEL DISPOSABLE) ×3 IMPLANT
TRAY DSU PREP LF (CUSTOM PROCEDURE TRAY) IMPLANT
TUBE CONNECTING 12'X1/4 (SUCTIONS) ×1
TUBE CONNECTING 12X1/4 (SUCTIONS) ×2 IMPLANT
UNDERPAD 30X36 HEAVY ABSORB (UNDERPADS AND DIAPERS) IMPLANT
WATER STERILE IRR 500ML POUR (IV SOLUTION) ×3 IMPLANT
YANKAUER SUCT BULB TIP NO VENT (SUCTIONS) ×3 IMPLANT

## 2021-01-30 NOTE — Discharge Instructions (Addendum)
GENERAL SURGERY: POST OP INSTRUCTIONS  DIET: Follow a light bland diet the first 24 hours after arrival home, such as soup, liquids, crackers, etc.  Be sure to include lots of fluids daily.  Avoid fast food or heavy meals as your are more likely to get nauseated.   Take your usually prescribed home medications unless otherwise directed. PAIN CONTROL: Pain is best controlled by a usual combination of three different methods TOGETHER: Ice/Heat Over the counter pain medication Prescription pain medication Most patients will experience some swelling and bruising around the incisions.  Ice packs or heating pads (30-60 minutes up to 6 times a day) will help. Use ice for the first few days to help decrease swelling and bruising, then switch to heat to help relax tight/sore spots and speed recovery.  Some people prefer to use ice alone, heat alone, alternating between ice & heat.  Experiment to what works for you.  Swelling and bruising can take several weeks to resolve.   It is helpful to take an over-the-counter pain medication regularly for the first few weeks.  Choose one of the following that works best for you: Naproxen (Aleve, etc)  Two 220mg  tabs twice a day Ibuprofen (Advil, etc) Three 200mg  tabs four times a day (every meal & bedtime) A  prescription for pain medication (such as Percocet, oxycodone, hydrocodone, etc) should be given to you upon discharge.  Take your pain medication as prescribed.  If you are having problems/concerns with the prescription medicine (does not control pain, nausea, vomiting, rash, itching, etc), please call us [phone] to see if we need to switch you to a different pain medicine that will work better for you and/or control your side effect better. If you need a refill on your pain medication, please contact your pharmacy.  They will contact our office to request authorization. Prescriptions will not be filled after 5 pm or on week-ends. Avoid getting constipated.   Between the surgery and the pain medications, it is common to experience some constipation.  Increasing fluid intake and taking a fiber supplement (such as Metamucil, Citrucel, FiberCon, MiraLax, etc) 1-2 times a day regularly will usually help prevent this problem from occurring.  A mild laxative (prune juice, Milk of Magnesia, MiraLax, etc) should be taken according to package directions if there are no bowel movements after 48 hours.   Wash / shower every day.  You may shower over the dressings as they are waterproof.  Continue to shower over incision(s) after the dressing is off. You have skin glue on your incision.  You may cover this for comfort.  Ok to shower and wash gently over this.  Pat dry afterwards.  The glue with flake off in 1-2 weeks.  ACTIVITIES as tolerated:   You may resume regular (light) daily activities beginning the next day--such as daily self-care, walking, climbing stairs--gradually increasing activities as tolerated.  If you can walk 30 minutes without difficulty, it is safe to try more intense activity such as jogging, treadmill, bicycling, low-impact aerobics, swimming, etc. Save the most intensive and strenuous activity for last such as sit-ups, heavy lifting, contact sports, etc  Refrain from any heavy lifting or straining until you are off narcotics for pain control.   DO NOT PUSH THROUGH PAIN.  Let pain be your guide: If it hurts to do something, don't do it.  Pain is your body warning you to avoid that activity for another week until the pain goes down. You may drive when you  are no longer taking prescription pain medication, you can comfortably wear a seatbelt, and you can safely maneuver your car and apply brakes. You may have sexual intercourse when it is comfortable.  FOLLOW UP in our office Please call [facility] at [phone] to set up an appointment to see your surgeon in the office for a follow-up appointment approximately 2-3 weeks after your surgery. Make sure  that you call for this appointment the day you arrive home to insure a convenient appointment time. 9. IF YOU HAVE DISABILITY OR FAMILY LEAVE FORMS, BRING THEM TO THE OFFICE FOR PROCESSING.  DO NOT GIVE THEM TO YOUR DOCTOR.   WHEN TO CALL us [phone]: Poor pain control Reactions / problems with new medications (rash/itching, nausea, etc)  Fever over 101.5 F (38.5 C) Worsening swelling or bruising Continued bleeding from incision. Increased pain, redness, or drainage from the incision   The clinic staff is available to answer your questions during regular business hours (8:30am-5pm).  Please don't hesitate to call and ask to speak to one of our nurses for clinical concerns.   If you have a medical emergency, go to the nearest emergency room or call 911.  A surgeon from [facility] is always on call at the [facility], [place], [place], [place], [place]  [address] ? MAIN: [phone] ? TOLL FREE: [phone] ?  FAX [phone] [email or web address]   Post Anesthesia Home Care Instructions  Activity: Get plenty of rest for the remainder of the day. A responsible adult should stay with you for 24 hours following the procedure.  For the next 24 hours, DO NOT: -Drive a car -Paediatric nurse -Drink alcoholic beverages -Take any medication unless instructed by your physician -Make any legal decisions or sign important papers.  Meals: Start with liquid foods such as gelatin or soup. Progress to regular foods as tolerated. Avoid greasy, spicy, heavy foods. If nausea and/or vomiting occur, drink only clear liquids until the nausea and/or vomiting subsides. Call your physician if vomiting continues.  Special Instructions/Symptoms: Your throat may feel dry or sore from the anesthesia or the breathing tube placed in your throat during surgery. If this causes discomfort, gargle with warm salt water. The discomfort  should disappear within 24 hours.  No ibuprofen, Advil, Aleve, Motrin, ketorolac, meloxicam, naproxen, or other NSAIDS until after 4:15 pm today if needed.

## 2021-01-30 NOTE — Anesthesia Procedure Notes (Signed)
Procedure Name: MAC Date/Time: 01/30/2021 9:50 AM Performed by: Justice Rocher, CRNA Pre-anesthesia Checklist: Timeout performed, Patient being monitored, Suction available, Emergency Drugs available and Patient identified Patient Re-evaluated:Patient Re-evaluated prior to induction Oxygen Delivery Method: Simple face mask Preoxygenation: Pre-oxygenation with 100% oxygen Induction Type: IV induction Placement Confirmation: breath sounds checked- equal and bilateral, CO2 detector and positive ETCO2

## 2021-01-30 NOTE — Interval H&P Note (Signed)
History and Physical Interval Note:  01/30/2021 8:27 AM  Sydney Alvarado  has presented today for surgery, with the diagnosis of LIPOMA 6x6cm LEFT TRUNK.  The various methods of treatment have been discussed with the patient and family. After consideration of risks, benefits and other options for treatment, the patient has consented to  Procedure(s): EXCISION MASS LEFT TRUNK (Left) as a surgical intervention.  The patient's history has been reviewed, patient examined, no change in status, stable for surgery.  I have reviewed the patient's chart and labs.  Questions were answered to the patient's satisfaction.     Rosario Adie, MD  [facility]

## 2021-01-30 NOTE — Anesthesia Postprocedure Evaluation (Signed)
Anesthesia Post Note  Patient: Sydney Alvarado  Procedure(s) Performed: EXCISION MASS LEFT TRUNK (Left: Axilla)     Patient location during evaluation: PACU Anesthesia Type: MAC Level of consciousness: awake and alert Pain management: pain level controlled Vital Signs Assessment: post-procedure vital signs reviewed and stable Respiratory status: spontaneous breathing, nonlabored ventilation, respiratory function stable and patient connected to nasal cannula oxygen Cardiovascular status: stable and blood pressure returned to baseline Postop Assessment: no apparent nausea or vomiting Anesthetic complications: no   No notable events documented.  Last Vitals:  Vitals:   01/30/21 1144 01/30/21 1213  BP:  (!) 139/99  Pulse: 62 65  Resp: 17 14  Temp:  36.5 C  SpO2: 94% 96%    Last Pain:  Vitals:   01/30/21 1213  TempSrc:   PainSc: 0-No pain                 Effie Berkshire

## 2021-01-30 NOTE — Anesthesia Preprocedure Evaluation (Signed)
Anesthesia Evaluation  Patient identified by MRN, date of birth, ID band Patient awake    Reviewed: Allergy & Precautions, NPO status , Patient's Chart, lab work & pertinent test results  History of Anesthesia Complications (+) PONV and history of anesthetic complications  Airway Mallampati: II  TM Distance: >3 FB Neck ROM: Full    Dental   - Upper bridge:   Pulmonary    Pulmonary exam normal        Cardiovascular negative cardio ROS Normal cardiovascular exam     Neuro/Psych  Headaches, negative psych ROS   GI/Hepatic Neg liver ROS, GERD  ,  Endo/Other  negative endocrine ROS  Renal/GU negative Renal ROS     Musculoskeletal  (+) Arthritis ,   Abdominal Normal abdominal exam  (+)   Peds  Hematology negative hematology ROS (+)   Anesthesia Other Findings   Reproductive/Obstetrics                             Anesthesia Physical Anesthesia Plan  ASA: 2  Anesthesia Plan: MAC   Post-op Pain Management:    Induction: Intravenous  PONV Risk Score and Plan: 4 or greater and Ondansetron, Dexamethasone, Propofol infusion and Midazolam  Airway Management Planned: Natural Airway and Simple Face Mask  Additional Equipment: None  Intra-op Plan:   Post-operative Plan:   Informed Consent: I have reviewed the patients History and Physical, chart, labs and discussed the procedure including the risks, benefits and alternatives for the proposed anesthesia with the patient or authorized representative who has indicated his/her understanding and acceptance.       Plan Discussed with: CRNA  Anesthesia Plan Comments:         Anesthesia Quick Evaluation

## 2021-01-30 NOTE — Transfer of Care (Signed)
Immediate Anesthesia Transfer of Care Note  Patient: Sydney Alvarado  Procedure(s) Performed: Procedure(s) (LRB): EXCISION MASS LEFT TRUNK (Left)  Patient Location: PACU  Anesthesia Type: MAC  Level of Consciousness: awake, sedated, patient cooperative and responds to stimulation  Airway & Oxygen Therapy: Patient Spontanous Breathing and Patient connected to [place] 02 and soft FM   Post-op Assessment: Report given to PACU RN, Post -op Vital signs reviewed and stable and Patient moving all extremities  Post vital signs: Reviewed and stable  Complications: No apparent anesthesia complications

## 2021-01-30 NOTE — Op Note (Signed)
01/30/2021  10:22 AM  PATIENT:  Sydney Alvarado  55 y.o. female  Patient Care Team: Pcp, No as PCP - General Newt Minion, MD as Attending Physician (Orthopedic Surgery) Paula Compton, MD as Attending Physician (Obstetrics and Gynecology) Warren Danes, PA-C as Physician Assistant (Dermatology)  PRE-OPERATIVE DIAGNOSIS:  LIPOMA 6x6cm LEFT TRUNK  POST-OPERATIVE DIAGNOSIS:  LIPOMA 6x6cm LEFT TRUNK  PROCEDURE:  EXCISIONAL BIOPSY LIPOMA LEFT TRUNK    Surgeon(s): Leighton Ruff, MD  ASSISTANT: none   ANESTHESIA:   local and MAC  EBL: 1 ml Total I/O In: -  Out: 1 [Blood:1]  DRAINS: none   SPECIMEN:  Source of Specimen:  L side lipoma  DISPOSITION OF SPECIMEN:  PATHOLOGY  COUNTS:  YES  PLAN OF CARE: Discharge to home after PACU  PATIENT DISPOSITION:  PACU - hemodynamically stable.  INDICATION: 55 y.o. F with L side lipoma just posterior to her scapula   OR FINDINGS: lipoma 6 x8 x3 cm  DESCRIPTION: the patient was identified in the preoperative holding area and taken to the OR where they were laid supine on the operating room table.  MAC anesthesia was induced without difficulty. SCDs were also noted to be in place prior to the initiation of anesthesia.  She was then propped on her right side and then prepped and draped in the usual sterile fashion.   A surgical timeout was performed indicating the correct patient, procedure, positioning and need for preoperative antibiotics.   I began by palpating the lipoma and making an incision laterally across the skin using a 15 blade scalpel.  Dissection was carried down through the subcutaneous tissues using electrocautery obtaining hemostasis as we went.  The lipoma was identified and bluntly dissected.  Electrocautery was used to dissect the posterior attachments.  The entire lipoma was removed.  This was sent to pathology for further examination.  It measured 6 x 8 x 3 cm in size.  The cavity was inspected for hemostasis.   There was no active bleeding.  The cavity was closed in layers with a posterior layer being closed with interrupted 2-0 Vicryl sutures.  The dermal layer was closed using interrupted 2-0 Vicryl sutures as well.  The skin was then closed using a 4-0 Vicryl subcuticular suture and Dermabond.  The patient was awakened from anesthesia and sent to the postanesthesia care unit in stable condition.  All counts were correct per operating room staff.

## 2021-01-31 ENCOUNTER — Encounter (HOSPITAL_BASED_OUTPATIENT_CLINIC_OR_DEPARTMENT_OTHER): Payer: Self-pay | Admitting: General Surgery

## 2021-01-31 LAB — SURGICAL PATHOLOGY

## 2021-04-08 ENCOUNTER — Other Ambulatory Visit: Payer: Self-pay

## 2021-04-08 ENCOUNTER — Telehealth: Payer: Self-pay | Admitting: Nurse Practitioner

## 2021-04-08 ENCOUNTER — Encounter: Payer: Self-pay | Admitting: Nurse Practitioner

## 2021-04-08 ENCOUNTER — Ambulatory Visit: Payer: 59 | Admitting: Nurse Practitioner

## 2021-04-08 VITALS — BP 138/88 | HR 55 | Temp 97.4°F | Resp 18 | Ht 64.0 in | Wt 194.6 lb

## 2021-04-08 DIAGNOSIS — Z23 Encounter for immunization: Secondary | ICD-10-CM

## 2021-04-08 DIAGNOSIS — Z0001 Encounter for general adult medical examination with abnormal findings: Secondary | ICD-10-CM

## 2021-04-08 DIAGNOSIS — E7800 Pure hypercholesterolemia, unspecified: Secondary | ICD-10-CM | POA: Diagnosis not present

## 2021-04-08 DIAGNOSIS — R739 Hyperglycemia, unspecified: Secondary | ICD-10-CM | POA: Diagnosis not present

## 2021-04-08 LAB — CBC WITH DIFFERENTIAL/PLATELET
Basophils Absolute: 0.1 10*3/uL (ref 0.0–0.1)
Basophils Relative: 0.9 % (ref 0.0–3.0)
Eosinophils Absolute: 0.3 10*3/uL (ref 0.0–0.7)
Eosinophils Relative: 3.3 % (ref 0.0–5.0)
HCT: 42.3 % (ref 36.0–46.0)
Hemoglobin: 13.7 g/dL (ref 12.0–15.0)
Lymphocytes Relative: 15.5 % (ref 12.0–46.0)
Lymphs Abs: 1.5 10*3/uL (ref 0.7–4.0)
MCHC: 32.4 g/dL (ref 30.0–36.0)
MCV: 89.4 fl (ref 78.0–100.0)
Monocytes Absolute: 0.5 10*3/uL (ref 0.1–1.0)
Monocytes Relative: 5.2 % (ref 3.0–12.0)
Neutro Abs: 7.1 10*3/uL (ref 1.4–7.7)
Neutrophils Relative %: 75.1 % (ref 43.0–77.0)
Platelets: 181 10*3/uL (ref 150.0–400.0)
RBC: 4.73 Mil/uL (ref 3.87–5.11)
RDW: 13.9 % (ref 11.5–15.5)
WBC: 9.5 10*3/uL (ref 4.0–10.5)

## 2021-04-08 LAB — LIPID PANEL
Cholesterol: 180 mg/dL (ref 0–200)
HDL: 51.3 mg/dL (ref >39.00)
LDL Cholesterol: 91 mg/dL (ref 0–99)
NonHDL: 129
Total CHOL/HDL Ratio: 4
Triglycerides: 189 mg/dL — ABNORMAL HIGH (ref 0.0–149.0)
VLDL: 37.8 mg/dL (ref 0.0–40.0)

## 2021-04-08 LAB — COMPREHENSIVE METABOLIC PANEL
ALT: 14 U/L (ref 0–35)
AST: 16 U/L (ref 0–37)
Albumin: 4.4 g/dL (ref 3.5–5.2)
Alkaline Phosphatase: 63 U/L (ref 39–117)
BUN: 7 mg/dL (ref 6–23)
CO2: 26 mEq/L (ref 19–32)
Calcium: 9.4 mg/dL (ref 8.4–10.5)
Chloride: 102 mEq/L (ref 96–112)
Creatinine, Ser: 0.72 mg/dL (ref 0.40–1.20)
GFR: 95.24 mL/min (ref >60.00)
Glucose, Bld: 93 mg/dL (ref 70–99)
Potassium: 3.7 mEq/L (ref 3.5–5.1)
Sodium: 137 mEq/L (ref 135–145)
Total Bilirubin: 0.4 mg/dL (ref 0.2–1.2)
Total Protein: 7.8 g/dL (ref 6.0–8.3)

## 2021-04-08 LAB — TSH: TSH: 2.64 u[IU]/mL (ref 0.35–5.50)

## 2021-04-08 LAB — HEMOGLOBIN A1C: Hgb A1c MFr Bld: 5.5 % (ref 4.6–6.5)

## 2021-04-08 NOTE — Telephone Encounter (Signed)
Patient notified and verbalized understanding. 

## 2021-04-08 NOTE — Telephone Encounter (Signed)
Based on her documented medical history I will not be able to complete her work form. Since she is currently under the care of an orthopedist for chronic knee pain, I recommend she discuss completion of her form with he/she  Thank you

## 2021-04-08 NOTE — Progress Notes (Signed)
Subjective:    Patient ID: Sydney Alvarado, female    DOB: 26-Sep-1965, 56 y.o.   MRN: [id number]  Patient presents today for CPE   HPI Hx of shingles, and varicella, no shingle outbreak within the last 1year. Agreed to 1st shingix vaccine today.  Followed by Sydney Alvarado with [facility]  Vision:up to date Dental:up to date Diet:none Exercise:none Weight:  Wt Readings from Last 3 Encounters:  04/08/21 194 lb 9.6 oz (88.3 kg)  01/30/21 190 lb (86.2 kg)  06/21/20 191 lb (86.6 kg)   Sexual History (orientation,birth control, marital status, STD): breast and pelvic exam completed by Sydney Alvarado, No STD tested. Up to date with colonoscopy Upcoming appt with Sydney Alvarado for annual mammogram, report requested Up to date with PAP  Depression/Suicide: Depression screen Sydney Alvarado 2/9 04/08/2021 06/15/2019 06/14/2019 06/01/2017 06/12/2015 02/01/2015  Decreased Interest 0 0 - 0 0 0  Down, Depressed, Hopeless 0 0 0 0 0 0  PHQ - 2 Score 0 0 0 0 0 0  Altered sleeping 2 - - - - -  Tired, decreased energy 2 - - - - -  Change in appetite 2 - - - - -  Feeling bad or failure about yourself  0 - - - - -  Trouble concentrating 0 - - - - -  Moving slowly or fidgety/restless 0 - - - - -  Suicidal thoughts 0 - - - - -  PHQ-9 Score 6 - - - - -  Difficult doing work/chores Not difficult at all - - - - -   GAD 7 : Generalized Anxiety Score 04/08/2021 06/14/2019  Nervous, Anxious, on Edge 0 0  Control/stop worrying 2 0  Worry too much - different things 2 0  Trouble relaxing 2 0  Restless 0 0  Easily annoyed or irritable 0 0  Afraid - awful might happen 0 0  Total GAD 7 Score 6 0  Anxiety Difficulty Not difficult at all Not difficult at all   Immunizations: (TDAP, Hep C screen, Pneumovax, Influenza, zoster)  Health Maintenance  Topic Date Due   Hepatitis C Screening: USPSTF Recommendation to screen - Ages 51-79 yo.  Never done   COVID-19 Vaccine (3 - Pfizer risk series) 08/17/2019   Mammogram   05/15/2021*   Flu Shot  06/14/2021*   Zoster (Shingles) Vaccine (2 of 2) 06/03/2021   Pap Smear  04/20/2022   Tetanus Vaccine  06/11/2025   Colon Cancer Screening  11/13/2027   HIV Screening  Completed   HPV Vaccine  Aged Out  *Topic was postponed. The date shown is not the original due date.   Fall Risk: Fall Risk  04/08/2021 06/15/2019 06/14/2019 06/01/2017 06/12/2015  Falls in the past year? 0 0 0 No Yes  Number falls in past yr: 0 0 0 - 1  Injury with Fall? 0 0 0 - No  Follow up - Falls evaluation completed Falls evaluation completed - -   Advanced Directive: Advanced Directives 01/30/2021  Does Patient Have a Medical Advance Directive? No  Would patient like information on creating a medical advance directive? No - Patient declined    Medications and allergies reviewed with patient and updated if appropriate.  Patient Active Problem List   Diagnosis Date Noted   Pure hypercholesterolemia 04/08/2021   Hyperglycemia 04/08/2021   Change in bowel habit 07/17/2020   Colon cancer screening 07/17/2020   Epigastric pain 07/17/2020   Gastroesophageal reflux disease 07/17/2020   Imaging of gastrointestinal tract  abnormal 07/17/2020   Liver lesion 01/04/2020   Diverticular disease 02/25/2019   Diverticulitis of large intestine with perforation without abscess 10/26/2018   Abdominal pain 10/25/2018   Acute diverticulitis 10/25/2018   Leukocytosis 10/25/2018   Current Outpatient Medications on File Prior to Visit  Medication Sig Dispense Refill   Calcium-Magnesium-Vitamin D [phone] LIQD Take by mouth.     cetirizine (ZYRTEC) 10 MG chewable tablet Chew 10 mg by mouth daily.     Cholecalciferol (VITAMIN D) 125 MCG (5000 UT) CAPS Take 5,000 Units by mouth daily.     Coenzyme Q10 (CO Q 10 PO) Take 1 capsule by mouth daily.     COVID-19 Specimen Collection KIT TEST AS DIRECTED TODAY     Multiple Vitamin (MULTIVITAMIN) capsule Take 1 capsule by mouth daily.     Multiple  Vitamins-Minerals (MULTIVITAMIN GUMMIES WOMENS) CHEW Chew 2 each by mouth daily.     Omega-3 1000 MG CAPS Take 1 capsule by mouth daily.     Probiotic Product (PROBIOTIC DAILY PO) Take 1 capsule by mouth daily. Take in morning     psyllium (METAMUCIL) 58.6 % packet Take 1 packet by mouth daily.     vitamin C (ASCORBIC ACID) 500 MG tablet Take 500 mg by mouth daily.     diazepam (VALIUM) 5 MG tablet Take 1 tablet (5 mg total) by mouth 30 (thirty) minutes before procedure for 1 dose. (Patient taking differently: Take 5 mg by mouth 30 (thirty) minutes before procedure. Not taking right now takes for MRI) 2 tablet 0   HYDROcodone-acetaminophen (NORCO/VICODIN) 5-325 MG tablet Take 1-2 tablets by mouth every 6 (six) hours as needed. 15 tablet 0   No current facility-administered medications on file prior to visit.   Past Medical History:  Diagnosis Date   Allergy    Aortic atherosclerosis ([place]) 04/16/2019   Arthritis    left knee, has had 5 surgery on left knee 2 ACL repairs   Cholelithiasis 04/16/2019   COVID    july 2022, tired, body aches, chills and fever. lasted10-14 days. 01/30/2021   Diverticulitis 10/2018   perforated   Hepatic steatosis    Hyperhidrosis    Liver lesion    PONV (postoperative nausea and vomiting)    Pre-diabetes    Sigmoid diverticulosis    Wears glasses     Past Surgical History:  Procedure Laterality Date   ANTERIOR CRUCIATE LIGAMENT REPAIR     DILATION AND CURETTAGE OF UTERUS  2001   KNEE ARTHROSCOPY Left    KNEE SURGERY     multiple left knee surgery (x4)   LAPAROSCOPIC SIGMOID COLECTOMY     2020   MASS EXCISION Left 01/30/2021   Procedure: EXCISION MASS LEFT TRUNK;  Surgeon: Sydney Ruff, MD;  Location: [facility];  Service: General;  Laterality: Left;   MOUTH SURGERY  2016   PLANTAR FASCIECTOMY Bilateral    03/2018, 07/2018    Social History   Socioeconomic History   Marital status: Married    Spouse name: Not on file    Number of children: Not on file   Years of education: Not on file   Highest education level: Not on file  Occupational History   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      Seatbelt: 100%; no texting   Social Determinants of Radio broadcast assistant Strain: Not on file  Food Insecurity: Not on file  Transportation Needs: Not on file  Physical Activity: Not on file  Stress: Not on file  Social Connections: Not on file   Family History  Problem Relation Age of Onset   Diabetes Mother    Hypertension Mother    Hypertension Father    Atrial fibrillation Father    Skin cancer Father    Hypertension Brother    Hypertension Maternal Grandmother    Heart disease Maternal Grandfather        heart attack   Hypertension Paternal Grandmother    Stroke Paternal Grandmother    Liver cancer Paternal Grandfather    Colon cancer Neg Hx    Breast cancer Neg Hx    Cervical cancer Neg Hx    Ovarian cancer Neg Hx    Endometrial cancer Neg Hx        Review of Systems  Constitutional:  Negative for fever, malaise/fatigue and weight loss.  HENT:  Negative for congestion and sore throat.   Eyes:        Negative for visual changes  Respiratory:  Negative for cough and shortness of breath.   Cardiovascular:  Negative for chest pain, palpitations and leg swelling.  Gastrointestinal:  Negative for blood  in stool, constipation, diarrhea and heartburn.  Genitourinary:  Negative for dysuria, frequency and urgency.  Musculoskeletal:  Negative for falls, joint pain and myalgias.  Skin:  Negative for rash.  Neurological:  Negative for dizziness, sensory change and headaches.  Endo/Heme/Allergies:  Does not bruise/bleed easily.  Psychiatric/Behavioral:  Negative for depression, hallucinations, substance abuse and suicidal ideas. The patient is not nervous/anxious and does not have insomnia.    Objective:   Vitals:   04/08/21 0936  BP: 138/88  Pulse: (!) 55  Resp: 18  Temp: (!) 97.4 F (36.3 C)  SpO2: 97%   Body mass index is 33.4 kg/m.  Physical Examination:  Physical Exam Vitals reviewed.  Constitutional:      General: She is not in acute distress.    Appearance: She is well-developed.  HENT:     Right Ear: Tympanic membrane, ear canal and external ear normal.     Left Ear: Tympanic membrane, ear canal and external ear normal.  Eyes:     Extraocular Movements: Extraocular movements intact.     Conjunctiva/sclera: Conjunctivae normal.  Cardiovascular:     Rate and Rhythm: Normal rate and regular rhythm.     Pulses: Normal pulses.     Heart sounds: Normal heart sounds.  Pulmonary:     Effort: Pulmonary effort is normal. No respiratory distress.     Breath sounds: Normal breath sounds.  Chest:     Chest wall: No tenderness.  Abdominal:     General: Bowel sounds  are normal. There is no distension.     Palpations: Abdomen is soft.     Tenderness: There is no abdominal tenderness.  Genitourinary:    Comments: Deferred breast and pelvic exam to GYN per patient Musculoskeletal:        General: Normal range of motion.     Cervical back: Normal range of motion and neck supple.     Right lower leg: No edema.     Left lower leg: No edema.  Skin:    General: Skin is warm and dry.  Neurological:     Mental Status: She is alert and oriented to person, place, and time.     Deep  Tendon Reflexes: Reflexes are normal and symmetric.  Psychiatric:        Mood and Affect: Mood normal.        Behavior: Behavior normal.        Thought Content: Thought content normal.   ASSESSMENT and PLAN: This visit occurred during the SARS-CoV-2 public health emergency.  Safety protocols were in place, including screening questions prior to the visit, additional usage of staff PPE, and extensive cleaning of exam room while observing appropriate contact time as indicated for disinfecting solutions.   Lavera was seen today for establish care.  Diagnoses and all orders for this visit:  Encounter for preventative adult health care exam with abnormal findings -     CBC with Differential/Platelet -     Comprehensive metabolic panel -     TSH  Pure hypercholesterolemia -     Lipid panel  Hyperglycemia -     Hemoglobin A1c  Need for shingles vaccine -     Varicella-zoster vaccine IM      Problem List Items Addressed This Visit       Other   Hyperglycemia   Relevant Orders   Hemoglobin A1c   Pure hypercholesterolemia   Relevant Orders   Lipid panel   Other Visit Diagnoses     Encounter for preventative adult health care exam with abnormal findings    -  Primary   Relevant Orders   CBC with Differential/Platelet   Comprehensive metabolic panel   TSH   Need for shingles vaccine       Relevant Orders   Varicella-zoster vaccine IM (Completed)       Follow up: Return in about 1 year (around 04/08/2022) for CPE (fasting).  Wilfred Lacy, NP

## 2021-04-08 NOTE — Patient Instructions (Signed)
Go to lab for blood draw  Sign medical release to get records from [facility] (mammogram and dexa scan)  Preventive Care 54-54 Years Old, Female Preventive care refers to lifestyle choices and visits with your health care provider that can promote health and wellness. Preventive care visits are also called wellness exams. What can I expect for my preventive care visit? Counseling Your health care provider may ask you questions about your: Medical history, including: Past medical problems. Family medical history. Pregnancy history. Current health, including: Menstrual cycle. Method of birth control. Emotional well-being. Home life and relationship well-being. Sexual activity and sexual health. Lifestyle, including: Alcohol, nicotine or tobacco, and drug use. Access to firearms. Diet, exercise, and sleep habits. Work and work Statistician. Sunscreen use. Safety issues such as seatbelt and bike helmet use. Physical exam Your health care provider will check your: Height and weight. These may be used to calculate your BMI (body mass index). BMI is a measurement that tells if you are at a healthy weight. Waist circumference. This measures the distance around your waistline. This measurement also tells if you are at a healthy weight and may help predict your risk of certain diseases, such as type 2 diabetes and high blood pressure. Heart rate and blood pressure. Body temperature. Skin for abnormal spots. What immunizations do I need? Vaccines are usually given at various ages, according to a schedule. Your health care provider will recommend vaccines for you based on your age, medical history, and lifestyle or other factors, such as travel or where you work. What tests do I need? Screening Your health care provider may recommend screening tests for certain conditions. This may include: Lipid and cholesterol levels. Diabetes screening. This is done by checking your blood sugar (glucose)  after you have not eaten for a while (fasting). Pelvic exam and Pap test. Hepatitis B test. Hepatitis C test. HIV (human immunodeficiency virus) test. STI (sexually transmitted infection) testing, if you are at risk. Lung cancer screening. Colorectal cancer screening. Mammogram. Talk with your health care provider about when you should start having regular mammograms. This may depend on whether you have a family history of breast cancer. BRCA-related cancer screening. This may be done if you have a family history of breast, ovarian, tubal, or peritoneal cancers. Bone density scan. This is done to screen for osteoporosis. Talk with your health care provider about your test results, treatment options, and if necessary, the need for more tests. Follow these instructions at home: Eating and drinking  Eat a diet that includes fresh fruits and vegetables, whole grains, lean protein, and low-fat dairy products. Take vitamin and mineral supplements as recommended by your health care provider. Do not drink alcohol if: Your health care provider tells you not to drink. You are pregnant, may be pregnant, or are planning to become pregnant. If you drink alcohol: Limit how much you have to 0-1 drink a day. Know how much alcohol is in your drink. In the [place] one drink equals one 12 oz bottle of beer (355 mL), one 5 oz glass of wine (148 mL), or one 1 oz glass of hard liquor (44 mL). Lifestyle Brush your teeth every morning and night with fluoride toothpaste. Floss one time each day. Exercise for at least 30 minutes 5 or more days each week. Do not use any products that contain nicotine or tobacco. These products include cigarettes, chewing tobacco, and vaping devices, such as e-cigarettes. If you need help quitting, ask your health care provider. Do not  use drugs. If you are sexually active, practice safe sex. Use a condom or other form of protection to prevent STIs. If you do not wish to become  pregnant, use a form of birth control. If you plan to become pregnant, see your health care provider for a prepregnancy visit. Take aspirin only as told by your health care provider. Make sure that you understand how much to take and what form to take. Work with your health care provider to find out whether it is safe and beneficial for you to take aspirin daily. Find healthy ways to manage stress, such as: Meditation, yoga, or listening to music. Journaling. Talking to a trusted person. Spending time with friends and family. Minimize exposure to UV radiation to reduce your risk of skin cancer. Safety Always wear your seat belt while driving or riding in a vehicle. Do not drive: If you have been drinking alcohol. Do not ride with someone who has been drinking. When you are tired or distracted. While texting. If you have been using any mind-altering substances or drugs. Wear a helmet and other protective equipment during sports activities. If you have firearms in your house, make sure you follow all gun safety procedures. Seek help if you have been physically or sexually abused. What's next? Visit your health care provider once a year for an annual wellness visit. Ask your health care provider how often you should have your eyes and teeth checked. Stay up to date on all vaccines. This information is not intended to replace advice given to you by your health care provider. Make sure you discuss any questions you have with your health care provider. Document Revised: 08/29/2020 Document Reviewed: 08/29/2020 Elsevier Patient Education  [place].

## 2021-04-22 LAB — HM MAMMOGRAPHY

## 2021-05-07 ENCOUNTER — Ambulatory Visit: Payer: 59 | Admitting: Nurse Practitioner

## 2021-07-16 ENCOUNTER — Ambulatory Visit: Payer: 59 | Admitting: Physician Assistant

## 2021-07-16 DIAGNOSIS — R61 Generalized hyperhidrosis: Secondary | ICD-10-CM

## 2021-07-16 DIAGNOSIS — L820 Inflamed seborrheic keratosis: Secondary | ICD-10-CM | POA: Diagnosis not present

## 2021-07-16 DIAGNOSIS — D224 Melanocytic nevi of scalp and neck: Secondary | ICD-10-CM | POA: Diagnosis not present

## 2021-07-16 DIAGNOSIS — D485 Neoplasm of uncertain behavior of skin: Secondary | ICD-10-CM

## 2021-07-16 MED ORDER — QBREXZA 2.4 % EX PADS: 1.0000 "application " | MEDICATED_PAD | Freq: Every day | CUTANEOUS | 6 refills | Status: DC

## 2021-07-16 NOTE — Patient Instructions (Signed)

## 2021-07-18 ENCOUNTER — Encounter: Payer: Self-pay | Admitting: Physician Assistant

## 2021-07-18 NOTE — Progress Notes (Signed)
? ?  Follow-Up Visit ?  ?Subjective  ?Sydney Alvarado is a 56 y.o. female who presents for the following: Annual Exam (Here for annual skin exams. Concerns lower back. And left shoulder. Its been itchy but it has since healed up. Also check right neck. Also check cyst in top of scalp. /No history of skin cancers. ). ? ? ?The following portions of the chart were reviewed this encounter and updated as appropriate:  Tobacco  Allergies  Meds  Problems  Med Hx  Surg Hx  Fam Hx   ?  ? ?Objective  ?Well appearing patient in no apparent distress; mood and affect are within normal limits. ? ?A full examination was performed including scalp, head, eyes, ears, nose, lips, neck, chest, axillae, abdomen, back, buttocks, bilateral upper extremities, bilateral lower extremities, hands, feet, fingers, toes, fingernails, and toenails. All findings within normal limits unless otherwise noted below. ? ?Left Axilla, Left Foot - Posterior, Left Hand - Anterior, Right Axilla, Right Foot - Posterior, Right Hand - Anterior ?Severe and constant dripping of hands, axillae and feet.  ? ?Right Anterior Neck front ?Peduculated brown crust.  ? ? ? ? ? ? ?Right Anterior Neck back ?Thick brown crust ? ? ? ? ? ? ? ?Assessment & Plan  ?Hyperhidrosis (6) ?Left Hand - Anterior; Right Hand - Anterior; Left Foot - Posterior; Right Foot - Posterior; Left Axilla; Right Axilla ? ?Glycopyrronium Tosylate (QBREXZA) 2.4 % PADS - Left Axilla, Left Foot - Posterior, Left Hand - Anterior, Right Axilla, Right Foot - Posterior, Right Hand - Anterior ?Apply 1 application. topically daily. ? ?Neoplasm of uncertain behavior of skin (2) ?Right Anterior Neck front ? ?Skin / nail biopsy ?Type of biopsy: tangential   ?Informed consent: discussed and consent obtained   ?Timeout: patient name, date of birth, surgical site, and procedure verified   ?Anesthesia: the lesion was anesthetized in a standard fashion   ?Anesthetic:  1% lidocaine w/ epinephrine 1-100,000 local  infiltration ?Instrument used: flexible razor blade   ?Hemostasis achieved with: ferric subsulfate   ?Outcome: patient tolerated procedure well   ?Post-procedure details: wound care instructions given   ? ?Specimen 1 - Surgical pathology ?Differential Diagnosis: scc vs bcc ? ?Check Margins: No ? ?Right Anterior Neck back ? ?Skin / nail biopsy ?Type of biopsy: tangential   ?Informed consent: discussed and consent obtained   ?Timeout: patient name, date of birth, surgical site, and procedure verified   ?Anesthesia: the lesion was anesthetized in a standard fashion   ?Anesthetic:  1% lidocaine w/ epinephrine 1-100,000 local infiltration ?Instrument used: flexible razor blade   ?Hemostasis achieved with: ferric subsulfate   ?Outcome: patient tolerated procedure well   ?Post-procedure details: wound care instructions given   ? ?Specimen 2 - Surgical pathology ?Differential Diagnosis: scc vs bcc ? ?Check Margins: No ? ? ? ?I, Tesean Stump, PA-C, have reviewed all documentation's for this visit.  The documentation on 07/18/21 for the exam, diagnosis, procedures and orders are all accurate and complete. ?

## 2021-09-20 ENCOUNTER — Ambulatory Visit: Payer: 59 | Admitting: Family Medicine

## 2021-09-20 ENCOUNTER — Encounter: Payer: Self-pay | Admitting: Family Medicine

## 2021-09-20 VITALS — BP 152/90 | HR 62 | Temp 97.6°F | Ht 64.0 in | Wt 193.2 lb

## 2021-09-20 DIAGNOSIS — H60502 Unspecified acute noninfective otitis externa, left ear: Secondary | ICD-10-CM

## 2021-09-20 DIAGNOSIS — R61 Generalized hyperhidrosis: Secondary | ICD-10-CM | POA: Insufficient documentation

## 2021-09-20 DIAGNOSIS — L74512 Primary focal hyperhidrosis, palms: Secondary | ICD-10-CM | POA: Insufficient documentation

## 2021-09-20 MED ORDER — NEOMYCIN-POLYMYXIN-HC 3.5-10000-1 OT SOLN: 3.0000 [drp] | Freq: Four times a day (QID) | OTIC | 0 refills | Status: DC

## 2021-09-20 NOTE — Progress Notes (Signed)
[facility] [address] [place] [address] Dept: [phone] Dept Fax: [phone]  Office Visit  Subjective:    Patient ID: Sydney Alvarado, female    DOB: [birth year], 56 y.o..   MRN: [id number]  Chief Complaint  Patient presents with   Acute Visit    C/o having LT ear pain/itching x 3 days.  Has been taking Allegra with little relief.      History of Present Illness:  Patient is in today for with a 2-day history of left ear pain. She notes the ear hurts to the touch around the outer ear. She notes she has chronic itching of her ear canals. She had an infection of her ear that felt similar about 10 years ago.  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   Pure hypercholesterolemia 04/08/2021   Hyperglycemia 04/08/2021   Change in bowel habit 07/17/2020   Colon cancer screening 07/17/2020   Epigastric pain 07/17/2020   Gastroesophageal reflux disease 07/17/2020   Imaging of gastrointestinal tract abnormal 07/17/2020   Liver lesion 01/04/2020   Diverticular disease 02/25/2019   Diverticulitis of large intestine with perforation without abscess 10/26/2018   Abdominal pain 10/25/2018   Acute diverticulitis 10/25/2018   Leukocytosis 10/25/2018   Past Surgical History:  Procedure Laterality Date   ANTERIOR CRUCIATE LIGAMENT REPAIR     DILATION AND CURETTAGE OF UTERUS  2001   KNEE ARTHROSCOPY Left    KNEE SURGERY     multiple left knee surgery (x4)   LAPAROSCOPIC SIGMOID COLECTOMY     2020   MASS EXCISION Left 01/30/2021   Procedure: EXCISION MASS LEFT TRUNK;  Surgeon: Leighton Ruff, MD;  Location: [facility];  Service: General;  Laterality: Left;   MOUTH SURGERY  2016   PLANTAR FASCIECTOMY Bilateral    03/2018, 07/2018   Family History  Problem Relation Age of Onset   Osteoporosis Mother 59   Diabetes Mother     Hypertension Mother    Hypertension Father    Atrial fibrillation Father    Skin cancer Father    Arthritis Brother    Hypertension Brother    Hypertension Maternal Grandmother    Osteoporosis Maternal Grandmother 2   Heart disease Maternal Grandfather        heart attack   Osteoporosis Paternal Grandmother 40   Hypertension Paternal Grandmother    Stroke Paternal Grandmother    Arthritis Paternal Grandfather    Liver cancer Paternal Grandfather    Colon cancer Neg Hx    Breast cancer Neg Hx    Cervical cancer Neg Hx    Ovarian cancer Neg Hx    Endometrial cancer Neg Hx     Outpatient Medications Prior to Visit  Medication Sig Dispense Refill   Calcium-Magnesium-Vitamin D 600-300-400 LIQD Take by mouth.     cetirizine (ZYRTEC) 10 MG chewable tablet Chew 10 mg by mouth daily.     Cholecalciferol (VITAMIN D) 125 MCG (5000 UT) CAPS Take 5,000 Units by mouth daily.     Coenzyme Q10 (CO Q 10 PO) Take 1 capsule by mouth daily.     diazepam (VALIUM) 5 MG tablet PLEASE SEE ATTACHED FOR DETAILED DIRECTIONS     Multiple Vitamin (MULTIVITAMIN) capsule Take 1 capsule by mouth daily.     Multiple Vitamins-Minerals (MULTIVITAMIN GUMMIES WOMENS) CHEW Chew 2 each by mouth daily.     Omega-3 1000  MG CAPS Take 1 capsule by mouth daily.     Probiotic Product (PROBIOTIC DAILY PO) Take 1 capsule by mouth daily. Take in morning     psyllium (METAMUCIL) 58.6 % packet Take 1 packet by mouth daily.     vitamin C (ASCORBIC ACID) 500 MG tablet Take 500 mg by mouth daily.     Glycopyrronium Tosylate (QBREXZA) 2.4 % PADS Apply 1 application. topically daily. 1 each 6   No facility-administered medications prior to visit.   Allergies  Allergen Reactions   Sulfa Antibiotics Hives and Other (See Comments)    Objective:   Today's Vitals   09/20/21 1122  BP: (!) 152/90  Pulse: 62  Temp: 97.6 F (36.4 C)  TempSrc: Temporal  SpO2: 98%  Weight: 193 lb 3.2 oz (87.6 kg)  Height: '5\' 4"'$  (1.626 m)    Body mass index is 33.16 kg/m.   General: Well developed, well nourished. No acute distress. HEENT: Normocephalic, non-traumatic. Left External ear appears normal. There is pain with tragal manipulation. The   left EAC is mildly swollen, but without any slough. The left TM is mildly injected. Psych: Alert and oriented. Normal mood and affect.  Health Maintenance Due  Topic Date Due   Zoster Vaccines- Shingrix (2 of 2) 06/03/2021     Assessment & Plan:   1. Acute otitis externa of left ear, unspecified type Exam and history consistent with an external otitis. I will treat her with a course of Cortisporin drops. I recommend she use Tylenol or ibuprofen for pain management. We did discuss use of HC cream on an ear swab to help manage chronic flares of itching.  - neomycin-polymyxin-hydrocortisone (CORTISPORIN) OTIC solution; Place 3 drops into the left ear 4 (four) times daily.  Dispense: 10 mL; Refill: 0   Return if symptoms worsen or fail to improve.   [name], MD

## 2022-03-06 ENCOUNTER — Encounter: Payer: Self-pay | Admitting: Nurse Practitioner

## 2022-03-06 ENCOUNTER — Ambulatory Visit: Payer: 59 | Admitting: Nurse Practitioner

## 2022-03-06 VITALS — BP 112/80 | HR 60 | Temp 97.4°F | Ht 64.0 in | Wt 193.8 lb

## 2022-03-06 DIAGNOSIS — Z01818 Encounter for other preprocedural examination: Secondary | ICD-10-CM | POA: Diagnosis not present

## 2022-03-06 DIAGNOSIS — M1712 Unilateral primary osteoarthritis, left knee: Secondary | ICD-10-CM | POA: Diagnosis not present

## 2022-03-06 DIAGNOSIS — K8020 Calculus of gallbladder without cholecystitis without obstruction: Secondary | ICD-10-CM | POA: Insufficient documentation

## 2022-03-06 DIAGNOSIS — E6609 Other obesity due to excess calories: Secondary | ICD-10-CM

## 2022-03-06 DIAGNOSIS — E7800 Pure hypercholesterolemia, unspecified: Secondary | ICD-10-CM

## 2022-03-06 DIAGNOSIS — K219 Gastro-esophageal reflux disease without esophagitis: Secondary | ICD-10-CM | POA: Diagnosis not present

## 2022-03-06 DIAGNOSIS — E66811 Obesity, class 1: Secondary | ICD-10-CM

## 2022-03-06 DIAGNOSIS — Z6833 Body mass index (BMI) 33.0-33.9, adult: Secondary | ICD-10-CM

## 2022-03-06 DIAGNOSIS — Z6834 Body mass index (BMI) 34.0-34.9, adult: Secondary | ICD-10-CM | POA: Insufficient documentation

## 2022-03-06 MED ORDER — WEGOVY 0.5 MG/0.5ML ~~LOC~~ SOAJ: 0.5000 mg | SUBCUTANEOUS | 0 refills | Status: DC

## 2022-03-06 NOTE — Assessment & Plan Note (Addendum)
Upcoming surgery for left total arthroplasty with spinal anesthesia by [facility]. Possibly 04/2022. Pre op clearance completed today: ordered cbc, cmp and ECG (NSR, no T wave or ST segment abnormality. No previous Ecg to compare) Previous surgical procedures: sigmoid colectomy in 2020, left knee arthroscopy, and dilation and curettage of uterus in 2001. No Hx of DVT/PE No Fhx of anesthesia complication.

## 2022-03-06 NOTE — Progress Notes (Signed)
Established Patient Visit  Patient: Sydney Alvarado   DOB: 1965/05/18   56 y.o. Female  MRN: [id number] Visit Date: 03/06/2022  Subjective:    Chief Complaint  Patient presents with   Acute Visit    Surgical clearance, knee replacement   Weight gain, interested in wegovy     HPI Gastroesophageal reflux disease Stable with diet modification  Liver lesion Followed by GI at [facility] annual MRI ABD  Degenerative arthritis of left knee Upcoming surgery for left total arthroplasty by [facility] [place]. Possibly 04/2022 Pre op clearance completed today  Reviewed medical, surgical, and social history today  Medications: Outpatient Medications Prior to Visit  Medication Sig   Calcium-Magnesium-Vitamin D 600-300-400 LIQD Take by mouth.   cetirizine (ZYRTEC) 10 MG chewable tablet Chew 10 mg by mouth daily.   Cholecalciferol (VITAMIN D) 125 MCG (5000 UT) CAPS Take 5,000 Units by mouth daily.   Coenzyme Q10 (CO Q 10 PO) Take 1 capsule by mouth daily.   diazepam (VALIUM) 5 MG tablet PLEASE SEE ATTACHED FOR DETAILED DIRECTIONS   Multiple Vitamin (MULTIVITAMIN) capsule Take 1 capsule by mouth daily.   Multiple Vitamins-Minerals (MULTIVITAMIN GUMMIES WOMENS) CHEW Chew 2 each by mouth daily.   Omega-3 1000 MG CAPS Take 1 capsule by mouth daily.   Probiotic Product (PROBIOTIC DAILY PO) Take 1 capsule by mouth daily. Take in morning   psyllium (METAMUCIL) 58.6 % packet Take 1 packet by mouth daily.   vitamin C (ASCORBIC ACID) 500 MG tablet Take 500 mg by mouth daily.   [DISCONTINUED] neomycin-polymyxin-hydrocortisone (CORTISPORIN) OTIC solution Place 3 drops into the left ear 4 (four) times daily. (Patient not taking: Reported on 03/06/2022)   No facility-administered medications prior to visit.   Reviewed past medical and social history.   ROS per HPI above      Objective:  BP 112/80 (BP Location: Left Arm, Patient Position: Sitting, Cuff Size:  Normal)   Pulse 60   Temp (!) 97.4 F (36.3 C) (Temporal)   Ht '5\' 4"'$  (1.626 m)   Wt 193 lb 12.8 oz (87.9 kg)   SpO2 98%   BMI 33.27 kg/m      Physical Exam  No results found for any visits on 03/06/22.    Assessment & Plan:    Problem List Items Addressed This Visit       Digestive   Gastroesophageal reflux disease    Stable with diet modification      Liver lesion    Followed by GI at [facility] annual MRI ABD        Musculoskeletal and Integument   Degenerative arthritis of left knee    Upcoming surgery for left total arthroplasty by [facility]. Possibly 04/2022 Pre op clearance completed today      Relevant Medications   Semaglutide-Weight Management (WEGOVY) 0.5 MG/0.5ML SOAJ     Other   Pure hypercholesterolemia   Relevant Medications   Semaglutide-Weight Management (WEGOVY) 0.5 MG/0.5ML SOAJ   Other Visit Diagnoses     Pre-op evaluation    -  Primary   Relevant Orders   EKG 12-Lead (Completed)   CBC   Comprehensive metabolic panel   Class 1 obesity due to excess calories with serious comorbidity and body mass index (BMI) of 33.0 to 33.9 in adult       Relevant Medications   Semaglutide-Weight Management (WEGOVY) 0.5  MG/0.5ML SOAJ      Return in about 4 weeks (around 04/03/2022) for Weight management.     Wilfred Lacy, NP

## 2022-03-06 NOTE — Assessment & Plan Note (Addendum)
Persistent difficulty with weight loss despite diet modifications. Unable to increase exercise intensity due to chronic knee pain secondary to DJD. Hx of hyperlipidemia and prediabetes Wt Readings from Last 3 Encounters:  03/06/22 193 lb 12.8 oz (87.9 kg)  09/20/21 193 lb 3.2 oz (87.6 kg)  04/08/21 194 lb 9.6 oz (88.3 kg)    We discussed use of wegovy injection, possible side effects and contraindications. She verbalized understanding. Wegovy 0.'5mg'$  weekly F/up in 33month

## 2022-03-06 NOTE — Patient Instructions (Addendum)
Go to lab Normal ECG. Will fax form with lab results Continue heart healthy diet  Calorie Counting for Weight Loss Calories are units of energy. Your body needs a certain number of calories from food to keep going throughout the day. When you eat or drink more calories than your body needs, your body stores the extra calories mostly as fat. When you eat or drink fewer calories than your body needs, your body burns fat to get the energy it needs. Calorie counting means keeping track of how many calories you eat and drink each day. Calorie counting can be helpful if you need to lose weight. If you eat fewer calories than your body needs, you should lose weight. Ask your health care provider what a healthy weight is for you. For calorie counting to work, you will need to eat the right number of calories each day to lose a healthy amount of weight per week. A dietitian can help you figure out how many calories you need in a day and will suggest ways to reach your calorie goal. A healthy amount of weight to lose each week is usually 1-2 lb (0.5-0.9 kg). This usually means that your daily calorie intake should be reduced by 500-750 calories. Eating 1,200-1,500 calories a day can help most women lose weight. Eating 1,500-1,800 calories a day can help most men lose weight. What do I need to know about calorie counting? Work with your health care provider or dietitian to determine how many calories you should get each day. To meet your daily calorie goal, you will need to: Find out how many calories are in each food that you would like to eat. Try to do this before you eat. Decide how much of the food you plan to eat. Keep a food log. Do this by writing down what you ate and how many calories it had. To successfully lose weight, it is important to balance calorie counting with a healthy lifestyle that includes regular activity. Where do I find calorie information?  The number of calories in a food can be  found on a Nutrition Facts label. If a food does not have a Nutrition Facts label, try to look up the calories online or ask your dietitian for help. Remember that calories are listed per serving. If you choose to have more than one serving of a food, you will have to multiply the calories per serving by the number of servings you plan to eat. For example, the label on a package of bread might say that a serving size is 1 slice and that there are 90 calories in a serving. If you eat 1 slice, you will have eaten 90 calories. If you eat 2 slices, you will have eaten 180 calories. How do I keep a food log? After each time that you eat, record the following in your food log as soon as possible: What you ate. Be sure to include toppings, sauces, and other extras on the food. How much you ate. This can be measured in cups, ounces, or number of items. How many calories were in each food and drink. The total number of calories in the food you ate. Keep your food log near you, such as in a pocket-sized notebook or on an app or website on your mobile phone. Some programs will calculate calories for you and show you how many calories you have left to meet your daily goal. What are some portion-control tips? Know how many calories are in  a serving. This will help you know how many servings you can have of a certain food. Use a measuring cup to measure serving sizes. You could also try weighing out portions on a kitchen scale. With time, you will be able to estimate serving sizes for some foods. Take time to put servings of different foods on your favorite plates or in your favorite bowls and cups so you know what a serving looks like. Try not to eat straight from a food's packaging, such as from a bag or box. Eating straight from the package makes it hard to see how much you are eating and can lead to overeating. Put the amount you would like to eat in a cup or on a plate to make sure you are eating the right  portion. Use smaller plates, glasses, and bowls for smaller portions and to prevent overeating. Try not to multitask. For example, avoid watching TV or using your computer while eating. If it is time to eat, sit down at a table and enjoy your food. This will help you recognize when you are full. It will also help you be more mindful of what and how much you are eating. What are tips for following this plan? Reading food labels Check the calorie count compared with the serving size. The serving size may be smaller than what you are used to eating. Check the source of the calories. Try to choose foods that are high in protein, fiber, and vitamins, and low in saturated fat, trans fat, and sodium. Shopping Read nutrition labels while you shop. This will help you make healthy decisions about which foods to buy. Pay attention to nutrition labels for low-fat or fat-free foods. These foods sometimes have the same number of calories or more calories than the full-fat versions. They also often have added sugar, starch, or salt to make up for flavor that was removed with the fat. Make a grocery list of lower-calorie foods and stick to it. Cooking Try to cook your favorite foods in a healthier way. For example, try baking instead of frying. Use low-fat dairy products. Meal planning Use more fruits and vegetables. One-half of your plate should be fruits and vegetables. Include lean proteins, such as chicken, [place], and fish. Lifestyle Each week, aim to do one of the following: 150 minutes of moderate exercise, such as walking. 75 minutes of vigorous exercise, such as running. General information Know how many calories are in the foods you eat most often. This will help you calculate calorie counts faster. Find a way of tracking calories that works for you. Get creative. Try different apps or programs if writing down calories does not work for you. What foods should I eat?  Eat nutritious foods. It is  better to have a nutritious, high-calorie food, such as an avocado, than a food with few nutrients, such as a bag of potato chips. Use your calories on foods and drinks that will fill you up and will not leave you hungry soon after eating. Examples of foods that fill you up are nuts and nut butters, vegetables, lean proteins, and high-fiber foods such as whole grains. High-fiber foods are foods with more than 5 g of fiber per serving. Pay attention to calories in drinks. Low-calorie drinks include water and unsweetened drinks. The items listed above may not be a complete list of foods and beverages you can eat. Contact a dietitian for more information. What foods should I limit? Limit foods or drinks that are  not good sources of vitamins, minerals, or protein or that are high in unhealthy fats. These include: Candy. Other sweets. Sodas, specialty coffee drinks, alcohol, and juice. The items listed above may not be a complete list of foods and beverages you should avoid. Contact a dietitian for more information. How do I count calories when eating out? Pay attention to portions. Often, portions are much larger when eating out. Try these tips to keep portions smaller: Consider sharing a meal instead of getting your own. If you get your own meal, eat only half of it. Before you start eating, ask for a container and put half of your meal into it. When available, consider ordering smaller portions from the menu instead of full portions. Pay attention to your food and drink choices. Knowing the way food is cooked and what is included with the meal can help you eat fewer calories. If calories are listed on the menu, choose the lower-calorie options. Choose dishes that include vegetables, fruits, whole grains, low-fat dairy products, and lean proteins. Choose items that are boiled, broiled, grilled, or steamed. Avoid items that are buttered, battered, fried, or served with cream sauce. Items labeled as  crispy are usually fried, unless stated otherwise. Choose water, low-fat milk, unsweetened iced tea, or other drinks without added sugar. If you want an alcoholic beverage, choose a lower-calorie option, such as a glass of wine or light beer. Ask for dressings, sauces, and syrups on the side. These are usually high in calories, so you should limit the amount you eat. If you want a salad, choose a garden salad and ask for grilled meats. Avoid extra toppings such as bacon, cheese, or fried items. Ask for the dressing on the side, or ask for olive oil and vinegar or lemon to use as dressing. Estimate how many servings of a food you are given. Knowing serving sizes will help you be aware of how much food you are eating at restaurants. Where to find more information Centers for Disease Control and Prevention: http://www.wolf.info/ U.[place]. Department of Agriculture: http://www.wilson-mendoza.org/ Summary Calorie counting means keeping track of how many calories you eat and drink each day. If you eat fewer calories than your body needs, you should lose weight. A healthy amount of weight to lose per week is usually 1-2 lb (0.5-0.9 kg). This usually means reducing your daily calorie intake by 500-750 calories. The number of calories in a food can be found on a Nutrition Facts label. If a food does not have a Nutrition Facts label, try to look up the calories online or ask your dietitian for help. Use smaller plates, glasses, and bowls for smaller portions and to prevent overeating. Use your calories on foods and drinks that will fill you up and not leave you hungry shortly after a meal. This information is not intended to replace advice given to you by your health care provider. Make sure you discuss any questions you have with your health care provider. Document Revised: 04/14/2019 Document Reviewed: 04/14/2019 Elsevier Patient Education  [place].

## 2022-03-06 NOTE — Progress Notes (Signed)
Established Patient Visit  Patient: Sydney Alvarado   DOB: [birth year]   56 y.o. Female  MRN: [id number] Visit Date: 03/06/2022  Subjective:    Chief Complaint  Patient presents with   Acute Visit    Surgical clearance, knee replacement   Weight gain, interested in wegovy     HPI Gastroesophageal reflux disease Stable with diet modification  Liver lesion Followed by GI at [facility]. [place] annual MRI ABD  Degenerative arthritis of left knee Upcoming surgery for left total arthroplasty with spinal anesthesia by [place]. Possibly 04/2022. Pre op clearance completed today: ordered cbc, cmp and ECG (NSR, no T wave or ST segment abnormality. No previous Ecg to compare) Previous surgical procedures: sigmoid colectomy in 2020, left knee arthroscopy, and dilation and curettage of uterus in 2001. No Hx of DVT/PE No Fhx of anesthesia complication.  Class 1 obesity due to excess calories with serious comorbidity and body mass index (BMI) of 33.0 to 33.9 in adult Persistent difficulty with weight loss despite diet modifications. Unable to increase exercise intensity due to chronic knee pain secondary to DJD. Hx of hyperlipidemia and prediabetes Wt Readings from Last 3 Encounters:  03/06/22 193 lb 12.8 oz (87.9 kg)  09/20/21 193 lb 3.2 oz (87.6 kg)  04/08/21 194 lb 9.6 oz (88.3 kg)    We discussed use of wegovy injection, possible side effects and contraindications. She verbalized understanding. MGQQPY 0.'5mg'$  weekly F/up in 16month Reviewed medical, surgical, and social history today Past Surgical History:  Procedure Laterality Date   ANTERIOR CRUCIATE LIGAMENT REPAIR     DILATION AND CURETTAGE OF UTERUS  2001   KNEE ARTHROSCOPY Left    KNEE SURGERY     multiple left knee surgery (x4)   LAPAROSCOPIC SIGMOID COLECTOMY     2020   MASS EXCISION Left 01/30/2021   Procedure: EXCISION MASS LEFT TRUNK;  Surgeon: TLeighton Ruff MD;  [facility]  Service: General;  Laterality: Left;   MOUTH SURGERY  2016   PLANTAR FASCIECTOMY Bilateral    03/2018, 07/2018    Medications: Outpatient Medications Prior to Visit  Medication Sig   Calcium-Magnesium-Vitamin D [id number] Take by mouth.   cetirizine (ZYRTEC) 10 MG chewable tablet Chew 10 mg by mouth daily.   Cholecalciferol (VITAMIN D) 125 MCG (5000 UT) CAPS Take 5,000 Units by mouth daily.   Coenzyme Q10 (CO Q 10 PO) Take 1 capsule by mouth daily.   diazepam (VALIUM) 5 MG tablet PLEASE SEE ATTACHED FOR DETAILED DIRECTIONS   Multiple Vitamin (MULTIVITAMIN) capsule Take 1 capsule by mouth daily.   Multiple Vitamins-Minerals (MULTIVITAMIN GUMMIES WOMENS) CHEW Chew 2 each by mouth daily.   Omega-3 1000 MG CAPS Take 1 capsule by mouth daily.   Probiotic Product (PROBIOTIC DAILY PO) Take 1 capsule by mouth daily. Take in morning   psyllium (METAMUCIL) 58.6 % packet Take 1 packet by mouth daily.   vitamin C (ASCORBIC ACID) 500 MG tablet Take 500 mg by mouth daily.   [DISCONTINUED] neomycin-polymyxin-hydrocortisone (CORTISPORIN) OTIC solution Place 3 drops into the left ear 4 (four) times daily. (Patient not taking: Reported on 03/06/2022)   No facility-administered medications prior to visit.   Review of Systems  Constitutional: Negative.   Respiratory: Negative.    Cardiovascular: Negative.   Gastrointestinal: Negative.   Genitourinary: Negative.   Endo/Heme/Allergies: Negative.   Psychiatric/Behavioral: Negative.  Objective:  BP 112/80 (BP Location: Left Arm, Patient Position: Sitting, Cuff Size: Normal)   Pulse 60   Temp (!) 97.4 F (36.3 C) (Temporal)   Ht '5\' 4"'$  (1.626 m)   Wt 193 lb 12.8 oz (87.9 kg)   SpO2 98%   BMI 33.27 kg/m   ECG: NSR, no T-wave or ST segment abnormality    Physical Exam Vitals and nursing note reviewed.  Constitutional:      General: She is not in acute distress. Cardiovascular:     Rate and Rhythm:  Normal rate and regular rhythm.     Pulses: Normal pulses.     Heart sounds: Normal heart sounds.  Pulmonary:     Effort: Pulmonary effort is normal. No respiratory distress.     Breath sounds: Normal breath sounds.  Musculoskeletal:        General: Normal range of motion.     Right lower leg: No edema.     Left lower leg: No edema.  Lymphadenopathy:     Cervical: No cervical adenopathy.  Skin:    General: Skin is warm and dry.  Neurological:     Mental Status: She is alert and oriented to person, place, and time.  Psychiatric:        Mood and Affect: Mood normal.        Behavior: Behavior normal.     No results found for any visits on 03/06/22.    Assessment & Plan:    Problem List Items Addressed This Visit       Digestive   Gastroesophageal reflux disease    Stable with diet modification        Musculoskeletal and Integument   Degenerative arthritis of left knee    Upcoming surgery for left total arthroplasty with spinal anesthesia by [place]. Possibly 04/2022. Pre op clearance completed today: ordered cbc, cmp and ECG (NSR, no T wave or ST segment abnormality. No previous Ecg to compare) Previous surgical procedures: sigmoid colectomy in 2020, left knee arthroscopy, and dilation and curettage of uterus in 2001. No Hx of DVT/PE No Fhx of anesthesia complication.      Relevant Medications   Semaglutide-Weight Management (WEGOVY) 0.5 MG/0.5ML SOAJ     Other   Class 1 obesity due to excess calories with serious comorbidity and body mass index (BMI) of 33.0 to 33.9 in adult    Persistent difficulty with weight loss despite diet modifications. Unable to increase exercise intensity due to chronic knee pain secondary to DJD. Hx of hyperlipidemia and prediabetes Wt Readings from Last 3 Encounters:  03/06/22 193 lb 12.8 oz (87.9 kg)  09/20/21 193 lb 3.2 oz (87.6 kg)  04/08/21 194 lb 9.6 oz (88.3 kg)    We discussed use of wegovy injection, possible  side effects and contraindications. She verbalized understanding. Wegovy 0.'5mg'$  weekly F/up in 68month     Relevant Medications   Semaglutide-Weight Management (WEGOVY) 0.5 MG/0.5ML SOAJ   Pure hypercholesterolemia   Relevant Medications   Semaglutide-Weight Management (WEGOVY) 0.5 MG/0.5ML SOAJ   Other Visit Diagnoses     Pre-op evaluation    -  Primary   Relevant Orders   EKG 12-Lead (Completed)   CBC   Comprehensive metabolic panel      Return in about 4 weeks (around 04/03/2022) for Weight management.     CWilfred Lacy NP

## 2022-03-06 NOTE — Assessment & Plan Note (Signed)
Stable with diet modification

## 2022-03-06 NOTE — Assessment & Plan Note (Signed)
Followed by GI at [place] annual MRI ABD

## 2022-03-07 LAB — COMPREHENSIVE METABOLIC PANEL
ALT: 22 U/L (ref 0–35)
AST: 18 U/L (ref 0–37)
Albumin: 4.4 g/dL (ref 3.5–5.2)
Alkaline Phosphatase: 70 U/L (ref 39–117)
BUN: 9 mg/dL (ref 6–23)
CO2: 27 mEq/L (ref 19–32)
Calcium: 9.6 mg/dL (ref 8.4–10.5)
Chloride: 103 mEq/L (ref 96–112)
Creatinine, Ser: 0.78 mg/dL (ref 0.40–1.20)
GFR: 85.97 mL/min (ref >60.00)
Glucose, Bld: 86 mg/dL (ref 70–99)
Potassium: 4.1 mEq/L (ref 3.5–5.1)
Sodium: 141 mEq/L (ref 135–145)
Total Bilirubin: 0.5 mg/dL (ref 0.2–1.2)
Total Protein: 7.5 g/dL (ref 6.0–8.3)

## 2022-03-07 LAB — CBC
HCT: 42.7 % (ref 36.0–46.0)
Hemoglobin: 13.9 g/dL (ref 12.0–15.0)
MCHC: 32.4 g/dL (ref 30.0–36.0)
MCV: 89.9 fl (ref 78.0–100.0)
Platelets: 210 10*3/uL (ref 150.0–400.0)
RBC: 4.75 Mil/uL (ref 3.87–5.11)
RDW: 14 % (ref 11.5–15.5)
WBC: 9.2 10*3/uL (ref 4.0–10.5)

## 2022-03-21 ENCOUNTER — Telehealth: Payer: Self-pay

## 2022-03-21 ENCOUNTER — Other Ambulatory Visit (HOSPITAL_COMMUNITY): Payer: Self-pay

## 2022-03-21 ENCOUNTER — Telehealth: Payer: Self-pay | Admitting: Nurse Practitioner

## 2022-03-21 NOTE — Telephone Encounter (Signed)
Pharmacy Patient Advocate Encounter   Received notification from [place] that prior authorization for [facility] 0.'5MG'$ /0.5ML auto-injectors is required/requested.   PA submitted on 03/21/22 to (ins) [employer] via CoverMyMeds Key [id number] Status is pending

## 2022-03-21 NOTE — Telephone Encounter (Signed)
PA pending. Telephone encounter created for PA

## 2022-03-21 NOTE — Telephone Encounter (Signed)
Pt called and stated that she have Prior athorization for wygovy  and pt stated that it is one more part that need to be filled out by you to get this done . Pt gave me a prior authorization team phone number is [phone] . Pt stated if you have any other questions please give her a call . Pt stated that this needs to be done by today in order for them to proceed

## 2022-03-25 NOTE — Telephone Encounter (Signed)
Pharmacy Patient Advocate Encounter  Received notification from [place] that the request for prior authorization for Sydney Alvarado 0.'5MG'$ /0.5ML auto-injectors has been denied due to no documentation that patient has been in a comprehensive weight management program for at least 6 months before starting this drug.      How would you like to proceed?  Please be advised appeals may take up to 5 business days to be submitted as pharmacist prepares necessary documentation.  Thank you!

## 2022-04-10 ENCOUNTER — Ambulatory Visit (INDEPENDENT_AMBULATORY_CARE_PROVIDER_SITE_OTHER): Payer: 59 | Admitting: Nurse Practitioner

## 2022-04-10 ENCOUNTER — Encounter: Payer: Self-pay | Admitting: Nurse Practitioner

## 2022-04-10 VITALS — BP 118/72 | HR 66 | Temp 97.4°F | Ht 64.0 in | Wt 196.8 lb

## 2022-04-10 DIAGNOSIS — Z6833 Body mass index (BMI) 33.0-33.9, adult: Secondary | ICD-10-CM

## 2022-04-10 DIAGNOSIS — E6609 Other obesity due to excess calories: Secondary | ICD-10-CM | POA: Diagnosis not present

## 2022-04-10 DIAGNOSIS — Z0001 Encounter for general adult medical examination with abnormal findings: Secondary | ICD-10-CM | POA: Diagnosis not present

## 2022-04-10 NOTE — Progress Notes (Signed)
Complete physical exam  Patient: Sydney Alvarado   DOB: [birth year]   57 y.o. Female  MRN: [id number] Visit Date: 04/10/2022  Subjective:    Chief Complaint  Patient presents with   Annual Exam    CPE,Breast exam, wants pap done at GYN Pt fasting  Wegovy alternative     Sydney Alvarado is a 57 y.o. female who presents today for a complete physical exam. She reports consuming a general diet.  No exercise  She generally feels well. She reports sleeping well. She does have additional problems to discuss today.  Vision:Yes Dental: yes STD Screen:No  BP Readings from Last 3 Encounters:  04/10/22 118/72  03/06/22 112/80  09/20/21 (!) 152/90    Wt Readings from Last 3 Encounters:  04/10/22 196 lb 12.8 oz (89.3 kg)  03/06/22 193 lb 12.8 oz (87.9 kg)  09/20/21 193 lb 3.2 oz (87.6 kg)    Most recent fall risk assessment:    04/08/2021    9:38 AM  Fall Risk   Falls in the past year? 0  Number falls in past yr: 0  Injury with Fall? 0   Depression screen:Yes - No Depression  Most recent depression screenings:    04/10/2022    9:46 AM 03/06/2022    3:15 PM  PHQ 2/9 Scores  PHQ - 2 Score 0 0  PHQ- 9 Score 5 6   HPI  Degenerative arthritis of left knee Scheduled for knee surgery 03/07/2023  Class 1 obesity due to excess calories with serious comorbidity and body mass index (BMI) of 33.0 to 33.9 in adult Unable to afford wegovy rx. She agreed to referral to [facility] weight management clinic. We discussed use of phentermine and possible side effects. She agreed to use med but will like to wait till after knee surgery on 05/07/2022. BP Readings from Last 3 Encounters:  04/10/22 118/72  03/06/22 112/80  09/20/21 (!) 152/90    Entered referral to weight management clinic Advised to maintain heart healthy diet, daily strength training exercise at home. F/up in 54month  Hyperhidrosis of palms and soles No improvement with drysol and botox injection  Colon polyp,  hyperplastic Last colonocsopy 2019 Repeat in 2029 per Dr. PHilarie Fredrickson  Past Medical History:  Diagnosis Date   Allergy    Aortic atherosclerosis [place] 04/16/2019   Arthritis    left knee, has had 5 surgery on left knee 2 ACL repairs   Cholelithiasis 04/16/2019   COVID    july 2022, tired, body aches, chills and fever. lasted10-14 days. 01/30/2021   Diverticulitis 10/2018   perforated   Hepatic steatosis    Hyperhidrosis    Liver lesion    PONV (postoperative nausea and vomiting)    Pre-diabetes    Sigmoid diverticulosis    Wears glasses    Past Surgical History:  Procedure Laterality Date   ANTERIOR CRUCIATE LIGAMENT REPAIR     DILATION AND CURETTAGE OF UTERUS  2001   KNEE ARTHROSCOPY Left    KNEE SURGERY     multiple left knee surgery (x4)   LAPAROSCOPIC SIGMOID COLECTOMY     2020   MASS EXCISION Left 01/30/2021   Procedure: EXCISION MASS LEFT TRUNK;  Surgeon: TLeighton Ruff MD;  [facility]  Service: General;  Laterality: Left;   MOUTH SURGERY  2016   PLANTAR FASCIECTOMY Bilateral    03/2018, 07/2018   Social History   Socioeconomic History   Marital status: Married    Spouse  name: Not on file   Number of children: 2   Years of education: Not on file   Highest education level: Not on file  Occupational History   Occupation: system analyst  Tobacco Use   Smoking status: Never   Smokeless tobacco: Never  Vaping Use   Vaping Use: Never used  Substance and Sexual Activity   Alcohol use: No   Drug use: No   Sexual activity: Yes    Birth control/protection: None  Other Topics Concern   Not on file  Social History Narrative   Marital status: married x 29 years.      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; loves job; 30  years      Seatbelt: 100%; no texting   Social Determinants of Radio broadcast assistant  Strain: Not on file  Food Insecurity: Not on file  Transportation Needs: Not on file  Physical Activity: Not on file  Stress: Not on file  Social Connections: Not on file  Intimate Partner Violence: Not on file   Family Status  Relation Name Status   Mother  Alive   Father  Alive   Sister  Alive   Brother  Alive   Daughter  Alive   Son  Alive   MGM  Alive   MGF  Deceased   PGM  Deceased   PGF  Deceased   Neg Hx  (Not Specified)   Family History  Problem Relation Age of Onset   Osteoporosis Mother 75   Diabetes Mother    Hypertension Mother    Hypertension Father    Atrial fibrillation Father    Skin cancer Father    Arthritis Brother    Hypertension Brother    Cancer Daughter 6       melanoma   Hypertension Maternal Grandmother    Osteoporosis Maternal Grandmother 9   Heart disease Maternal Grandfather        heart attack   Osteoporosis Paternal Grandmother 35   Hypertension Paternal Grandmother    Stroke Paternal Grandmother    Arthritis Paternal Grandfather    Liver cancer Paternal Grandfather    Colon cancer Neg Hx    Breast cancer Neg Hx    Cervical cancer Neg Hx    Ovarian cancer Neg Hx    Endometrial cancer Neg Hx    Allergies  Allergen Reactions   Sulfa Antibiotics Hives and Other (See Comments)    Patient Care Team: Latif Nazareno, Charlene Brooke, NP as PCP - General (Internal Medicine) Newt Minion, MD as Attending Physician (Orthopedic Surgery) Paula Compton, MD as Attending Physician (Obstetrics and Gynecology) Warren Danes, PA-C as Physician Assistant (Dermatology)   Medications: Outpatient Medications Prior to Visit  Medication Sig   Calcium-Magnesium-Vitamin D 600-300-400 LIQD Take by mouth.   cetirizine (ZYRTEC) 10 MG chewable tablet Chew 10 mg by mouth daily.   Cholecalciferol (VITAMIN D) 125 MCG (5000 UT) CAPS Take 5,000 Units by mouth daily.   Coenzyme Q10 (CO Q 10 PO) Take 1 capsule by mouth daily.   Multiple Vitamin  (MULTIVITAMIN) capsule Take 1 capsule by mouth daily.   Multiple Vitamins-Minerals (MULTIVITAMIN GUMMIES WOMENS) CHEW Chew 2 each by mouth daily.   Omega-3 1000 MG CAPS Take 1 capsule by mouth daily.   Probiotic Product (PROBIOTIC DAILY PO) Take 1 capsule  by mouth daily. Take in morning   psyllium (METAMUCIL) 58.6 % packet Take 1 packet by mouth daily.   vitamin C (ASCORBIC ACID) 500 MG tablet Take 500 mg by mouth daily.   [DISCONTINUED] diazepam (VALIUM) 5 MG tablet PLEASE SEE ATTACHED FOR DETAILED DIRECTIONS (Patient not taking: Reported on 04/10/2022)   [DISCONTINUED] Semaglutide-Weight Management (WEGOVY) 0.5 MG/0.5ML SOAJ Inject 0.5 mg into the skin once a week. (Patient not taking: Reported on 04/10/2022)   No facility-administered medications prior to visit.    Review of Systems  Constitutional:  Negative for fever.  HENT:  Negative for congestion and sore throat.   Eyes:        Negative for visual changes  Respiratory:  Negative for cough and shortness of breath.   Cardiovascular:  Negative for chest pain, palpitations and leg swelling.  Gastrointestinal:  Negative for blood in stool, constipation and diarrhea.  Genitourinary:  Negative for dysuria, frequency and urgency.  Musculoskeletal:  Negative for myalgias.  Skin:  Negative for rash.  Neurological:  Negative for dizziness and headaches.  Hematological:  Does not bruise/bleed easily.  Psychiatric/Behavioral:  Negative for suicidal ideas. The patient is not nervous/anxious.         Objective:  BP 118/72 (BP Location: Left Arm, Patient Position: Sitting, Cuff Size: Normal)   Pulse 66   Temp (!) 97.4 F (36.3 C) (Temporal)   Ht '5\' 4"'$  (1.626 m)   Wt 196 lb 12.8 oz (89.3 kg)   LMP 02/03/2022 (Exact Date)   SpO2 98%   BMI 33.78 kg/m     Physical Exam Vitals reviewed.  Constitutional:      General: She is not in acute distress.    Appearance: She is well-developed.  HENT:     Right Ear: Tympanic membrane, ear  canal and external ear normal.     Left Ear: Tympanic membrane, ear canal and external ear normal.     Nose: Nose normal.     Mouth/Throat:     Pharynx: No oropharyngeal exudate.  Eyes:     Extraocular Movements: Extraocular movements intact.     Conjunctiva/sclera: Conjunctivae normal.     Pupils: Pupils are equal, round, and reactive to light.  Neck:     Thyroid: No thyroid mass, thyromegaly or thyroid tenderness.  Cardiovascular:     Rate and Rhythm: Normal rate and regular rhythm.     Pulses: Normal pulses.     Heart sounds: Normal heart sounds.  Pulmonary:     Effort: Pulmonary effort is normal. No respiratory distress.     Breath sounds: Normal breath sounds.  Chest:     Chest wall: No tenderness.  Breasts:    Breasts are symmetrical.     Right: Normal.     Left: Normal.  Abdominal:     General: Bowel sounds are normal.     Palpations: Abdomen is soft.  Genitourinary:    Comments: Deferred to GYN Musculoskeletal:        General: Normal range of motion.     Cervical back: Normal range of motion and neck supple.     Right lower leg: No edema.     Left lower leg: No edema.  Lymphadenopathy:     Cervical: No cervical adenopathy.     Upper Body:     Right upper body: No supraclavicular, axillary or pectoral adenopathy.     Left upper body: No supraclavicular, axillary or pectoral adenopathy.  Skin:    General: Skin is warm and  dry.  Neurological:     Mental Status: She is alert and oriented to person, place, and time.     Deep Tendon Reflexes: Reflexes are normal and symmetric.  Psychiatric:        Mood and Affect: Mood normal.        Behavior: Behavior normal.        Thought Content: Thought content normal.      No results found for any visits on 04/10/22.    Assessment & Plan:    Routine Health Maintenance and Physical Exam  Immunization History  Administered Date(s) Administered   Influenza, Seasonal, Injecte, Preservative Fre 03/26/2012    Influenza,inj,Quad PF,6+ Mos 02/01/2015   Moderna Sars-Covid-2 Vaccination 03/14/2020   PFIZER(Purple Top)SARS-COV-2 Vaccination 06/23/2019, 07/20/2019   Tdap 06/12/2015   Zoster Recombinat (Shingrix) 04/08/2021   Health Maintenance  Topic Date Due   Hepatitis C Screening  Never done   PAP SMEAR-Modifier  04/20/2022   COVID-19 Vaccine (4 - 2023-24 season) 04/26/2022 (Originally 11/15/2021)   Zoster Vaccines- Shingrix (2 of 2) 06/05/2022 (Originally 06/03/2021)   INFLUENZA VACCINE  06/15/2022 (Originally 10/15/2021)   MAMMOGRAM  04/23/2023   DTaP/Tdap/Td (2 - Td or Tdap) 06/11/2025   COLONOSCOPY (Pts 45-36yr Insurance coverage will need to be confirmed)  11/13/2027   HIV Screening  Completed   HPV VACCINES  Aged Out   Discussed health benefits of physical activity, and encouraged her to engage in regular exercise appropriate for her age and condition.  Problem List Items Addressed This Visit       Other   Class 1 obesity due to excess calories with serious comorbidity and body mass index (BMI) of 33.0 to 33.9 in adult - Primary    Unable to afford wegovy rx. [facility] We discussed use of phentermine and possible side effects. She agreed to use med but will like to wait till after knee surgery on 05/07/2022. BP Readings from Last 3 Encounters:  04/10/22 118/72  03/06/22 112/80  09/20/21 (!) 152/90    Entered referral to weight management clinic Advised to maintain heart healthy diet, daily strength training exercise at home. F/up in 258month     Relevant Orders   Amb Ref to Medical Weight Management   Return in about 2 months (around 06/09/2022) for Weight management (video).     ChWilfred LacyNP

## 2022-04-10 NOTE — Patient Instructions (Addendum)
Phentermine or qsymia for weight loss Start daily leg exercise to strengthen muscle around the knee. You will be contacted to schedule appt with weight management clinic Schedule appt with dermatology and GYN  Preventive Care 55-55 Years Old, Female Preventive care refers to lifestyle choices and visits with your health care provider that can promote health and wellness. Preventive care visits are also called wellness exams. What can I expect for my preventive care visit? Counseling Your health care provider may ask you questions about your: Medical history, including: Past medical problems. Family medical history. Pregnancy history. Current health, including: Menstrual cycle. Method of birth control. Emotional well-being. Home life and relationship well-being. Sexual activity and sexual health. Lifestyle, including: Alcohol, nicotine or tobacco, and drug use. Access to firearms. Diet, exercise, and sleep habits. Work and work Statistician. Sunscreen use. Safety issues such as seatbelt and bike helmet use. Physical exam Your health care provider will check your: Height and weight. These may be used to calculate your BMI (body mass index). BMI is a measurement that tells if you are at a healthy weight. Waist circumference. This measures the distance around your waistline. This measurement also tells if you are at a healthy weight and may help predict your risk of certain diseases, such as type 2 diabetes and high blood pressure. Heart rate and blood pressure. Body temperature. Skin for abnormal spots. What immunizations do I need?  Vaccines are usually given at various ages, according to a schedule. Your health care provider will recommend vaccines for you based on your age, medical history, and lifestyle or other factors, such as travel or where you work. What tests do I need? Screening Your health care provider may recommend screening tests for certain conditions. This may  include: Lipid and cholesterol levels. Diabetes screening. This is done by checking your blood sugar (glucose) after you have not eaten for a while (fasting). Pelvic exam and Pap test. Hepatitis B test. Hepatitis C test. HIV (human immunodeficiency virus) test. STI (sexually transmitted infection) testing, if you are at risk. Lung cancer screening. Colorectal cancer screening. Mammogram. Talk with your health care provider about when you should start having regular mammograms. This may depend on whether you have a family history of breast cancer. BRCA-related cancer screening. This may be done if you have a family history of breast, ovarian, tubal, or peritoneal cancers. Bone density scan. This is done to screen for osteoporosis. Talk with your health care provider about your test results, treatment options, and if necessary, the need for more tests. Follow these instructions at home: Eating and drinking  Eat a diet that includes fresh fruits and vegetables, whole grains, lean protein, and low-fat dairy products. Take vitamin and mineral supplements as recommended by your health care provider. Do not drink alcohol if: Your health care provider tells you not to drink. You are pregnant, may be pregnant, or are planning to become pregnant. If you drink alcohol: Limit how much you have to 0-1 drink a day. Know how much alcohol is in your drink. In the [place] one drink equals one 12 oz bottle of beer (355 mL), one 5 oz glass of wine (148 mL), or one 1 oz glass of hard liquor (44 mL). Lifestyle Brush your teeth every morning and night with fluoride toothpaste. Floss one time each day. Exercise for at least 30 minutes 5 or more days each week. Do not use any products that contain nicotine or tobacco. These products include cigarettes, chewing tobacco, and vaping devices,  such as e-cigarettes. If you need help quitting, ask your health care provider. Do not use drugs. If you are sexually  active, practice safe sex. Use a condom or other form of protection to prevent STIs. If you do not wish to become pregnant, use a form of birth control. If you plan to become pregnant, see your health care provider for a prepregnancy visit. Take aspirin only as told by your health care provider. Make sure that you understand how much to take and what form to take. Work with your health care provider to find out whether it is safe and beneficial for you to take aspirin daily. Find healthy ways to manage stress, such as: Meditation, yoga, or listening to music. Journaling. Talking to a trusted person. Spending time with friends and family. Minimize exposure to UV radiation to reduce your risk of skin cancer. Safety Always wear your seat belt while driving or riding in a vehicle. Do not drive: If you have been drinking alcohol. Do not ride with someone who has been drinking. When you are tired or distracted. While texting. If you have been using any mind-altering substances or drugs. Wear a helmet and other protective equipment during sports activities. If you have firearms in your house, make sure you follow all gun safety procedures. Seek help if you have been physically or sexually abused. What's next? Visit your health care provider once a year for an annual wellness visit. Ask your health care provider how often you should have your eyes and teeth checked. Stay up to date on all vaccines. This information is not intended to replace advice given to you by your health care provider. Make sure you discuss any questions you have with your health care provider. Document Revised: 08/29/2020 Document Reviewed: 08/29/2020 Elsevier Patient Education  [place].

## 2022-04-10 NOTE — Assessment & Plan Note (Signed)
No improvement with drysol and botox injection

## 2022-04-10 NOTE — Assessment & Plan Note (Signed)
Last colonocsopy 2019 Repeat in 2029 per Dr. Hilarie Fredrickson

## 2022-04-10 NOTE — Assessment & Plan Note (Signed)
Scheduled for knee surgery 03/07/2023

## 2022-04-10 NOTE — Assessment & Plan Note (Signed)
Unable to afford wegovy rx. She agreed to referral to [facility] weight management clinic. We discussed use of phentermine and possible side effects. She agreed to use med but will like to wait till after knee surgery on 05/07/2022. BP Readings from Last 3 Encounters:  04/10/22 118/72  03/06/22 112/80  09/20/21 (!) 152/90    Entered referral to weight management clinic Advised to maintain heart healthy diet, daily strength training exercise at home. F/up in 43month

## 2022-04-28 LAB — HM MAMMOGRAPHY

## 2022-04-29 ENCOUNTER — Encounter: Payer: Self-pay | Admitting: Nurse Practitioner

## 2022-05-09 ENCOUNTER — Other Ambulatory Visit (HOSPITAL_COMMUNITY): Payer: Self-pay | Admitting: Orthopedic Surgery

## 2022-05-09 ENCOUNTER — Ambulatory Visit (HOSPITAL_COMMUNITY)
Admission: RE | Admit: 2022-05-09 | Discharge: 2022-05-09 | Disposition: A | Discharge status: Home / Self Care | Payer: 59 | Source: Ambulatory Visit | Attending: Orthopedic Surgery | Admitting: Orthopedic Surgery

## 2022-05-09 DIAGNOSIS — M79605 Pain in left leg: Secondary | ICD-10-CM | POA: Diagnosis not present

## 2022-05-09 DIAGNOSIS — M79604 Pain in right leg: Secondary | ICD-10-CM

## 2022-05-12 ENCOUNTER — Encounter (INDEPENDENT_AMBULATORY_CARE_PROVIDER_SITE_OTHER): Payer: 59 | Admitting: Family Medicine

## 2022-05-12 DIAGNOSIS — Z0289 Encounter for other administrative examinations: Secondary | ICD-10-CM

## 2022-06-03 ENCOUNTER — Other Ambulatory Visit (HOSPITAL_COMMUNITY): Payer: Self-pay

## 2022-06-09 ENCOUNTER — Ambulatory Visit: Payer: 59 | Admitting: Nurse Practitioner

## 2022-06-10 ENCOUNTER — Ambulatory Visit (INDEPENDENT_AMBULATORY_CARE_PROVIDER_SITE_OTHER): Payer: 59 | Admitting: Family Medicine

## 2022-06-10 ENCOUNTER — Encounter (INDEPENDENT_AMBULATORY_CARE_PROVIDER_SITE_OTHER): Payer: Self-pay | Admitting: Family Medicine

## 2022-06-10 VITALS — BP 165/85 | HR 67 | Temp 98.0°F | Ht 64.0 in | Wt 198.0 lb

## 2022-06-10 DIAGNOSIS — Z6834 Body mass index (BMI) 34.0-34.9, adult: Secondary | ICD-10-CM

## 2022-06-10 DIAGNOSIS — R5383 Other fatigue: Secondary | ICD-10-CM | POA: Diagnosis not present

## 2022-06-10 DIAGNOSIS — E7800 Pure hypercholesterolemia, unspecified: Secondary | ICD-10-CM

## 2022-06-10 DIAGNOSIS — E669 Obesity, unspecified: Secondary | ICD-10-CM

## 2022-06-10 DIAGNOSIS — Z1331 Encounter for screening for depression: Secondary | ICD-10-CM | POA: Diagnosis not present

## 2022-06-10 DIAGNOSIS — R7303 Prediabetes: Secondary | ICD-10-CM | POA: Diagnosis not present

## 2022-06-10 DIAGNOSIS — E569 Vitamin deficiency, unspecified: Secondary | ICD-10-CM | POA: Diagnosis not present

## 2022-06-10 DIAGNOSIS — K760 Fatty (change of) liver, not elsewhere classified: Secondary | ICD-10-CM | POA: Diagnosis not present

## 2022-06-10 DIAGNOSIS — R0602 Shortness of breath: Secondary | ICD-10-CM

## 2022-06-10 DIAGNOSIS — R030 Elevated blood-pressure reading, without diagnosis of hypertension: Secondary | ICD-10-CM

## 2022-06-10 DIAGNOSIS — Z9049 Acquired absence of other specified parts of digestive tract: Secondary | ICD-10-CM

## 2022-06-10 NOTE — Progress Notes (Signed)
Chief Complaint:   OBESITY Sydney Alvarado (MR# [id number]) is a 57 y.o. female who presents for evaluation and treatment of obesity and related comorbidities. Current BMI is Body mass index is 33.99 kg/m. Sydney Alvarado has been struggling with her weight for many years and has been unsuccessful in either losing weight, maintaining weight loss, or reaching her healthy weight goal.  New patient.  Came to information session.  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  Lives at home with husband Sydney Alvarado) and son Sydney Alvarado age 36).  Anticipates unintentional sabotage as family will likely not eat same foods as she does.  Desired weight of 125lb- last time she was 125lb was 25 years ago.  Thinks weight gain was due to life stressors.  Previously did Weight Watchers and liked that because of variety of food.  Eats out 3-4 times a week at some fast food places.  She skips part due to habit.  She gets [place] about 10:11 AM but sometimes does not have time to eat.  Then she eats 1 cup of cottage cheese, 10 olives and 1 sun drop daily.  She may go to [place] get a sandwich and fries and will eat at all (satisfied).  6 to 8 hours later she will eat cereal, raisin bran crunch, 3 cups, 1% milk, (she feels full)..  She ate ice cream daily and a Nutty bar cone.  Sharonica is currently in the action stage of change and ready to dedicate time achieving and maintaining a healthier weight. Adejah is interested in becoming our patient and working on intensive lifestyle modifications including (but not limited to) diet and exercise for weight loss.  Margeret's habits were reviewed today and are as follows: Her family eats meals together, she thinks her family will eat healthier with her, she struggles with family and or coworkers weight loss sabotage, her desired weight loss is 73 lbs, she has been heavy most of her life, she started gaining weight in the last 20 years, her heaviest weight ever was  207 pounds, she has significant food cravings issues, she snacks frequently in the evenings, she skips meals frequently, she is frequently drinking liquids with calories, she frequently makes poor food choices, she has problems with excessive hunger, she frequently eats larger portions than normal, and she struggles with emotional eating.  Depression Screen Sydney Alvarado's Food and Mood (modified PHQ-9) score was 12.  Subjective:   1. Other fatigue Winta admits to daytime somnolence and admits to waking up still tired. Patient has a history of symptoms of daytime fatigue and morning fatigue. Patrice generally gets 4 or 5 hours of sleep per night, and states that she has nightime awakenings. Snoring is present. Apneic episodes are not present. Epworth Sleepiness Score is 7.  EKG done on March 06, 2022, sinus bradycardia with significant artifact.  2. SOBOE (shortness of breath on exertion) Hadeel notes increasing shortness of breath with exercising and seems to be worsening over time with weight gain. She notes getting out of breath sooner with activity than she used to. This has not gotten worse recently. Makinley denies shortness of breath at rest or orthopnea.  3. Vitamin deficiency Patient is on 125 mcg vitamin D daily.  Patient is positive for fatigue.  4. NAFLD (nonalcoholic fatty liver disease) Patient sees transplant doctor at [facility].  Patient has a lesion on her liver seen incidentally.  5. Prediabetes Last A1c was 5.5, but previously  in prediabetic range.  Patient is not on any medications.  6. Pure hypercholesterolemia Patient has elevated triglycerides on last FLP but previously LDL and triglycerides were elevated.  7.  S/P partial colectomy Patient has some symptoms of diarrhea and abdominal pain after eating fatty foods.  8. Elevated blood pressure reading in office without diagnosis of hypertension Elevated blood pressure.  Strong family history of hypertension.  Patient does not have a  diagnosis of hypertension.  Patient denies chest pain, chest pressure, headache.    Assessment/Plan:   1. Other fatigue Sydney Alvarado does feel that her weight is causing her energy to be lower than it should be. Fatigue may be related to obesity, depression or many other causes. Labs will be ordered, and in the meanwhile, Sydney Alvarado will focus on self care including making healthy food choices, increasing physical activity and focusing on stress reduction.  Check IC and labs today.  - Folate - T3 - T4, free - TSH - Vitamin B12  2. SOBOE (shortness of breath on exertion) Sydney Alvarado does feel that she gets out of breath more easily that she used to when she exercises. Sydney Alvarado's shortness of breath appears to be obesity related and exercise induced. She has agreed to work on weight loss and gradually increase exercise to treat her exercise induced shortness of breath. Will continue to monitor closely.  3. Vitamin deficiency Check vitamin D level today.  - VITAMIN D 25 Hydroxy (Vit-D Deficiency, Fractures)  4. NAFLD (nonalcoholic fatty liver disease) Follow-up in 1 year with hepatologist.  5. Prediabetes Check labs today.  - Hemoglobin A1c - Insulin, random  6. Pure hypercholesterolemia Check labs today.  - Lipid Panel With LDL/HDL Ratio  7. S/P partial colectomy Follow-up on symptoms at next appointment.  8. Elevated blood pressure reading in office without diagnosis of hypertension Follow-up on blood pressure at next appointment  9. Depression screening Sydney Alvarado had a positive depression screening. Depression is commonly associated with obesity and often results in emotional eating behaviors. We will monitor this closely and work on CBT to help improve the non-hunger eating patterns. Referral to Psychology may be required if no improvement is seen as she continues in our clinic.  10. Class 1 obesity with serious comorbidity and body mass index (BMI) of 34.0 to 34.9 in adult, unspecified obesity  type Sydney Alvarado is currently in the action stage of change and her goal is to continue with weight loss efforts. I recommend Shaindel begin the structured treatment plan as follows:  She has agreed to the Category 3 Plan.  Exercise goals: No exercise has been prescribed at this time.   Behavioral modification strategies: increasing lean protein intake, meal planning and cooking strategies, keeping healthy foods in the home, and planning for success.  She was informed of the importance of frequent follow-up visits to maximize her success with intensive lifestyle modifications for her multiple health conditions. She was informed we would discuss her lab results at her next visit unless there is a critical issue that needs to be addressed sooner. Juline agreed to keep her next visit at the agreed upon time to discuss these results.  Objective:   Blood pressure (!) 165/85, pulse 67, temperature 98 F (36.7 C), height 5\' 4"  (1.626 m), weight 198 lb (89.8 kg), last menstrual period 01/28/2022, SpO2 98 %. Body mass index is 33.99 kg/m.  EKG: Normal sinus rhythm, rate 55 bpm.  Indirect Calorimeter completed today shows a VO2 of 258 and a REE of 1786.  Her  calculated basal metabolic rate is [id number] thus her basal metabolic rate is better than expected.  General: Cooperative, alert, well developed, in no acute distress. HEENT: Conjunctivae and lids unremarkable. Cardiovascular: Regular rhythm.  Lungs: Normal work of breathing. Neurologic: No focal deficits.   Lab Results  Component Value Date   CREATININE 0.78 03/06/2022   BUN 9 03/06/2022   NA 141 03/06/2022   K 4.1 03/06/2022   CL 103 03/06/2022   CO2 27 03/06/2022   Lab Results  Component Value Date   ALT 22 03/06/2022   AST 18 03/06/2022   ALKPHOS 70 03/06/2022   BILITOT 0.5 03/06/2022   Lab Results  Component Value Date   HGBA1C 5.8 (H) 06/10/2022   HGBA1C 5.5 04/08/2021   HGBA1C 5.7 (H) 06/15/2019   HGBA1C 5.8 (H) 06/12/2015   Lab  Results  Component Value Date   INSULIN 21.0 06/10/2022   Lab Results  Component Value Date   TSH 2.120 06/10/2022   Lab Results  Component Value Date   CHOL 256 (H) 06/10/2022   HDL 60 06/10/2022   LDLCALC 155 (H) 06/10/2022   TRIG 228 (H) 06/10/2022   CHOLHDL 4 04/08/2021   Lab Results  Component Value Date   WBC 9.2 03/06/2022   HGB 13.9 03/06/2022   HCT 42.7 03/06/2022   MCV 89.9 03/06/2022   PLT 210.0 03/06/2022   No results found for: "IRON", "TIBC", "FERRITIN"  Attestation Statements:   Reviewed by clinician on day of visit: allergies, medications, problem list, medical history, surgical history, family history, social history, and previous encounter notes.  I, Davy Pique, RMA, am acting as transcriptionist for Coralie Common, MD. This is the patient's first visit at [facility]. The patient's NEW PATIENT PACKET was reviewed at length. Included in the packet: current and past health history, medications, allergies, ROS, gynecologic history (women only), surgical history, family history, social history, weight history, weight loss surgery history (for those that have had weight loss surgery), nutritional evaluation, mood and food questionnaire, PHQ9, Epworth questionnaire, sleep habits questionnaire, patient life and health improvement goals questionnaire. These will all be scanned into the patient's chart under media.   During the visit, I independently reviewed the patient's EKG, bioimpedance scale results, and indirect calorimeter results. I used this information to tailor a meal plan for the patient that will help her to lose weight and will improve her obesity-related conditions going forward. I performed a medically necessary appropriate examination and/or evaluation. I discussed the assessment and treatment plan with the patient. The patient was provided an opportunity to ask questions and all were answered. The patient agreed with the plan and  demonstrated an understanding of the instructions. Labs were ordered at this visit and will be reviewed at the next visit unless more critical results need to be addressed immediately. Clinical information was updated and documented in the EMR.   Time spent on visit including pre-visit chart review and post-visit care was 45 minutes.     I have reviewed the above documentation for accuracy and completeness, and I agree with the above. - Coralie Common, MD

## 2022-06-11 LAB — LIPID PANEL WITH LDL/HDL RATIO
Cholesterol, Total: 256 mg/dL — ABNORMAL HIGH (ref 100–199)
HDL: 60 mg/dL (ref >39)
LDL Chol Calc (NIH): 155 mg/dL — ABNORMAL HIGH (ref 0–99)
LDL/HDL Ratio: 2.6 ratio (ref 0.0–3.2)
Triglycerides: 228 mg/dL — ABNORMAL HIGH (ref 0–149)
VLDL Cholesterol Cal: 41 mg/dL — ABNORMAL HIGH (ref 5–40)

## 2022-06-11 LAB — T4, FREE: Free T4: 1.1 ng/dL (ref 0.82–1.77)

## 2022-06-11 LAB — HEMOGLOBIN A1C
Est. average glucose Bld gHb Est-mCnc: 120 mg/dL
Hgb A1c MFr Bld: 5.8 % — ABNORMAL HIGH (ref 4.8–5.6)

## 2022-06-11 LAB — T3: T3, Total: 126 ng/dL (ref 71–180)

## 2022-06-11 LAB — FOLATE: Folate: 19 ng/mL (ref >3.0)

## 2022-06-11 LAB — VITAMIN D 25 HYDROXY (VIT D DEFICIENCY, FRACTURES): Vit D, 25-Hydroxy: 58.1 ng/mL (ref 30.0–100.0)

## 2022-06-11 LAB — VITAMIN B12: Vitamin B-12: 685 pg/mL (ref 232–1245)

## 2022-06-11 LAB — INSULIN, RANDOM: INSULIN: 21 u[IU]/mL (ref 2.6–24.9)

## 2022-06-11 LAB — TSH: TSH: 2.12 u[IU]/mL (ref 0.450–4.500)

## 2022-06-24 ENCOUNTER — Encounter (INDEPENDENT_AMBULATORY_CARE_PROVIDER_SITE_OTHER): Payer: Self-pay | Admitting: Family Medicine

## 2022-06-24 ENCOUNTER — Ambulatory Visit (INDEPENDENT_AMBULATORY_CARE_PROVIDER_SITE_OTHER): Payer: 59 | Admitting: Family Medicine

## 2022-06-24 VITALS — BP 129/78 | HR 64 | Temp 98.4°F | Ht 64.0 in | Wt 194.0 lb

## 2022-06-24 DIAGNOSIS — E669 Obesity, unspecified: Secondary | ICD-10-CM | POA: Diagnosis not present

## 2022-06-24 DIAGNOSIS — R7303 Prediabetes: Secondary | ICD-10-CM | POA: Diagnosis not present

## 2022-06-24 DIAGNOSIS — E559 Vitamin D deficiency, unspecified: Secondary | ICD-10-CM | POA: Diagnosis not present

## 2022-06-24 DIAGNOSIS — Z6834 Body mass index (BMI) 34.0-34.9, adult: Secondary | ICD-10-CM

## 2022-06-24 DIAGNOSIS — E7849 Other hyperlipidemia: Secondary | ICD-10-CM

## 2022-06-24 NOTE — Progress Notes (Unsigned)
Chief Complaint:   OBESITY Sydney Alvarado is here to discuss her progress with her obesity treatment plan along with follow-up of her obesity related diagnoses. Sydney Alvarado is on the Category 3 Plan and states she is following her eating plan approximately 80% of the time. Sydney Alvarado states she is easy, bicycle, walking 1 mile for 30 minutes 2 to 7 days/week.  Today's visit was #: 2 Starting weight: 198 lbs Starting date: 06/10/2022 Today's weight: 194 lbs Today's date: 06/24/2022 Total lbs lost to date: 4 lbs Total lbs lost since last in-office visit: 4 lbs  Interim History: First follow up.  Felt she learned quite a bit.  She recognized that she wasn't eating to adequately fuel herself.  She is still struggling with breakfast but was forcing herself to get all the food in but broke breakfast up into 2 portions.  She did feel a bit hungry a few times.  Sometimes she isn't sure if she got all the food in.  Was making ranch dip with cottage cheese for snack calories, sugar free pudding or popsicles for snack.    Subjective:   1. Vitamin D deficiency Discussed labs with patient today. Patient is on OTC vitamin D.  Patient vitamin D level 58.1.  Patient is positive for fatigue.  2. Other hyperlipidemia Worsening. Discussed labs with patient today. Patient LDL 155, HDL 60, triglycerides 228.  10-year ASCVD 2.2% optimal 1.2%.  3. Prediabetes Worsening.  Discussed labs with patient today. Patient is not taking any medication.  This is not a new diagnosis.  Assessment/Plan:   1. Vitamin D deficiency Continue OTC vitamin D.  No change in dose.  Repeat labs in 4 to 5 months.  2. Other hyperlipidemia Repeat FLP in 3 to 4 months.  3. Prediabetes Pathophysiology of insulin resistance, prediabetes, diabetes discussed today.  Patient to continue category 3 at this time.  Repeat labs in 3 to 4 months.  4. Class 1 obesity with serious comorbidity and body mass index (BMI) of 34.0 to 34.9 in adult,  unspecified obesity type Sydney Alvarado is currently in the action stage of change. As such, her goal is to continue with weight loss efforts. She has agreed to the Category 3 Plan.   Exercise goals:  As is.  Behavioral modification strategies: increasing lean protein intake, meal planning and cooking strategies, keeping healthy foods in the home, and planning for success.  Sydney Alvarado has agreed to follow-up with our clinic in 2 weeks. She was informed of the importance of frequent follow-up visits to maximize her success with intensive lifestyle modifications for her multiple health conditions.   Objective:   Blood pressure 129/78, pulse 64, temperature 98.4 F (36.9 C), height 5\' 4"  (1.626 m), weight 194 lb (88 kg), last menstrual period 01/28/2022, SpO2 99 %. Body mass index is 33.3 kg/m.  General: Cooperative, alert, well developed, in no acute distress. HEENT: Conjunctivae and lids unremarkable. Cardiovascular: Regular rhythm.  Lungs: Normal work of breathing. Neurologic: No focal deficits.   Lab Results  Component Value Date   CREATININE 0.78 03/06/2022   BUN 9 03/06/2022   NA 141 03/06/2022   K 4.1 03/06/2022   CL 103 03/06/2022   CO2 27 03/06/2022   Lab Results  Component Value Date   ALT 22 03/06/2022   AST 18 03/06/2022   ALKPHOS 70 03/06/2022   BILITOT 0.5 03/06/2022   Lab Results  Component Value Date   HGBA1C 5.8 (H) 06/10/2022   HGBA1C 5.5 04/08/2021  HGBA1C 5.7 (H) 06/15/2019   HGBA1C 5.8 (H) 06/12/2015   Lab Results  Component Value Date   INSULIN 21.0 06/10/2022   Lab Results  Component Value Date   TSH 2.120 06/10/2022   Lab Results  Component Value Date   CHOL 256 (H) 06/10/2022   HDL 60 06/10/2022   LDLCALC 155 (H) 06/10/2022   TRIG 228 (H) 06/10/2022   CHOLHDL 4 04/08/2021   Lab Results  Component Value Date   VD25OH 58.1 06/10/2022   Lab Results  Component Value Date   WBC 9.2 03/06/2022   HGB 13.9 03/06/2022   HCT 42.7 03/06/2022   MCV  89.9 03/06/2022   PLT 210.0 03/06/2022   No results found for: "IRON", "TIBC", "FERRITIN"  Attestation Statements:   Reviewed by clinician on day of visit: allergies, medications, problem list, medical history, surgical history, family history, social history, and previous encounter notes. Total time in pre visit chart review, appointment and post visit summary was 45 minutes I, Malcolm Metro, RMA, am acting as transcriptionist for Reuben Likes, MD.  I have reviewed the above documentation for accuracy and completeness, and I agree with the above. - Reuben Likes, MD

## 2022-06-30 ENCOUNTER — Telehealth: Payer: Self-pay | Admitting: Pharmacy Technician

## 2022-06-30 NOTE — Telephone Encounter (Signed)
Patient Advocate Encounter   Received notification that prior authorization for [facility] 0.5MG /0.5ML auto-injectors is required.   PA submitted on 06/30/2022 [employer] Electronic PA Form Status is pending

## 2022-07-04 NOTE — Telephone Encounter (Signed)
PA Approval

## 2022-07-09 ENCOUNTER — Encounter: Payer: Self-pay | Admitting: Nurse Practitioner

## 2022-07-14 ENCOUNTER — Encounter (INDEPENDENT_AMBULATORY_CARE_PROVIDER_SITE_OTHER): Payer: Self-pay | Admitting: Family Medicine

## 2022-07-14 ENCOUNTER — Ambulatory Visit (INDEPENDENT_AMBULATORY_CARE_PROVIDER_SITE_OTHER): Payer: 59 | Admitting: Family Medicine

## 2022-07-14 VITALS — BP 133/84 | HR 65 | Temp 98.1°F | Ht 64.0 in | Wt 197.0 lb

## 2022-07-14 DIAGNOSIS — E65 Localized adiposity: Secondary | ICD-10-CM

## 2022-07-14 DIAGNOSIS — E669 Obesity, unspecified: Secondary | ICD-10-CM | POA: Diagnosis not present

## 2022-07-14 DIAGNOSIS — E7849 Other hyperlipidemia: Secondary | ICD-10-CM | POA: Diagnosis not present

## 2022-07-14 DIAGNOSIS — Z6833 Body mass index (BMI) 33.0-33.9, adult: Secondary | ICD-10-CM | POA: Diagnosis not present

## 2022-07-14 MED ORDER — WEGOVY 0.25 MG/0.5ML ~~LOC~~ SOAJ: 0.2500 mg | SUBCUTANEOUS | 0 refills | Status: DC

## 2022-07-14 NOTE — Progress Notes (Signed)
Chief Complaint:   OBESITY Sydney Alvarado is here to discuss her progress with her obesity treatment plan along with follow-up of her obesity related diagnoses. Sacred is on the Category 3 Plan and states she is following her eating plan approximately 60% of the time. Bianco states she is riding her bike 30 minutes 7 times per week.  Today's visit was #: 3 Starting weight: 198 lb Starting date: 06/10/2022 Today's weight: 197 lbs Today's date: 07/14/2022 Total lbs lost to date: 1 lb Total lbs lost since last in-office visit: +3 lbs  Interim History: Patient went to [employer] concert 2 nights ago and stayed in [place] over the weekend.  Patient voices she has had a hard two weeks- gone out to eat more times.  She has gone back to work.  Worried she is going to get tired of eggs.  She is wondering about making substitutions and is interested in what those options are.    Subjective:   1. Visceral Adiposity Patient last LDL 155, HDL 60, triglycerides 228.  Patient is not on any medications.  2. Other hyperlipidemia Bioimpedance visceral fat 11.  Prediabetes, HLD.  Assessment/Plan:   1. Visceral Adiposity DEXA with body composition.  - DG BONE DENSITY (DXA); Future  2. Other hyperlipidemia Repeat labs in 2 months.  3. Obesity with current BMI of 33.8 Start- Semaglutide-Weight Management (WEGOVY) 0.25 MG/0.5ML SOAJ; Inject 0.25 mg into the skin once a week.  Dispense: 2 mL; Refill: 0  Cassara is currently in the action stage of change. As such, her goal is to continue with weight loss efforts. She has agreed to the Category 3 Plan.   Exercise goals: All adults should avoid inactivity. Some physical activity is better than none, and adults who participate in any amount of physical activity gain some health benefits.  Behavioral modification strategies: increasing lean protein intake, meal planning and cooking strategies, keeping healthy foods in the home, and planning for  success.  Jesenia has agreed to follow-up with our clinic in 2 weeks. She was informed of the importance of frequent follow-up visits to maximize her success with intensive lifestyle modifications for her multiple health conditions.   Objective:   Blood pressure 133/84, pulse 65, temperature 98.1 F (36.7 C), height 5\' 4"  (1.626 m), weight 197 lb (89.4 kg), SpO2 100 %. Body mass index is 33.81 kg/m.  General: Cooperative, alert, well developed, in no acute distress. HEENT: Conjunctivae and lids unremarkable. Cardiovascular: Regular rhythm.  Lungs: Normal work of breathing. Neurologic: No focal deficits.   Lab Results  Component Value Date   CREATININE 0.78 03/06/2022   BUN 9 03/06/2022   NA 141 03/06/2022   K 4.1 03/06/2022   CL 103 03/06/2022   CO2 27 03/06/2022   Lab Results  Component Value Date   ALT 22 03/06/2022   AST 18 03/06/2022   ALKPHOS 70 03/06/2022   BILITOT 0.5 03/06/2022   Lab Results  Component Value Date   HGBA1C 5.8 (H) 06/10/2022   HGBA1C 5.5 04/08/2021   HGBA1C 5.7 (H) 06/15/2019   HGBA1C 5.8 (H) 06/12/2015   Lab Results  Component Value Date   INSULIN 21.0 06/10/2022   Lab Results  Component Value Date   TSH 2.120 06/10/2022   Lab Results  Component Value Date   CHOL 256 (H) 06/10/2022   HDL 60 06/10/2022   LDLCALC 155 (H) 06/10/2022   TRIG 228 (H) 06/10/2022   CHOLHDL 4 04/08/2021   Lab Results  Component Value Date   VD25OH 58.1 06/10/2022   Lab Results  Component Value Date   WBC 9.2 03/06/2022   HGB 13.9 03/06/2022   HCT 42.7 03/06/2022   MCV 89.9 03/06/2022   PLT 210.0 03/06/2022   No results found for: "IRON", "TIBC", "FERRITIN"  Attestation Statements:   Reviewed by clinician on day of visit: allergies, medications, problem list, medical history, surgical history, family history, social history, and previous encounter notes.  I, Malcolm Metro, RMA, am acting as transcriptionist for Reuben Likes, MD.  I have  reviewed the above documentation for accuracy and completeness, and I agree with the above. - Reuben Likes, MD

## 2022-07-17 ENCOUNTER — Ambulatory Visit: Payer: 59 | Admitting: Physician Assistant

## 2022-07-29 ENCOUNTER — Ambulatory Visit (INDEPENDENT_AMBULATORY_CARE_PROVIDER_SITE_OTHER): Payer: 59 | Admitting: Family Medicine

## 2022-07-29 ENCOUNTER — Encounter (INDEPENDENT_AMBULATORY_CARE_PROVIDER_SITE_OTHER): Payer: Self-pay | Admitting: Family Medicine

## 2022-07-29 VITALS — BP 143/81 | HR 59 | Temp 98.0°F | Ht 64.0 in | Wt 192.0 lb

## 2022-07-29 DIAGNOSIS — R7303 Prediabetes: Secondary | ICD-10-CM | POA: Diagnosis not present

## 2022-07-29 DIAGNOSIS — E7849 Other hyperlipidemia: Secondary | ICD-10-CM | POA: Diagnosis not present

## 2022-07-29 DIAGNOSIS — E669 Obesity, unspecified: Secondary | ICD-10-CM | POA: Diagnosis not present

## 2022-07-29 DIAGNOSIS — Z6832 Body mass index (BMI) 32.0-32.9, adult: Secondary | ICD-10-CM

## 2022-07-29 DIAGNOSIS — Z6833 Body mass index (BMI) 33.0-33.9, adult: Secondary | ICD-10-CM

## 2022-07-29 DIAGNOSIS — E66811 Obesity, class 1: Secondary | ICD-10-CM

## 2022-07-29 NOTE — Progress Notes (Unsigned)
Chief Complaint:   OBESITY Sydney Alvarado is here to discuss her progress with her obesity treatment plan along with follow-up of her obesity related diagnoses. Sydney Alvarado is on the Category 3 Plan and states she is following her eating plan approximately 60-65% of the time. Sydney Alvarado states she is riding the stationary bike and walking for 15-45 minutes 3-5 times per week.  Today's visit was #: 4 Starting weight: 198 lbs Starting date: 06/10/2022 Today's weight: 192 lbs Today's date: 07/29/2022 Total lbs lost to date: 6 Total lbs lost since last in-office visit: 5  Interim History: Patient feels she is noticing an improvement in mood.  She started the [facility] and isn't noticing much in terms of hunger signal changes or satiety.  Lunch and dinner is a struggle.  She knows she isn't getting all the calories in all day everyday. When she gets to the end of the day she is struggling with what to eat and modification.  Still wondering how to eat out in a nutritious way.   Subjective:   1. Other hyperlipidemia Sydney Alvarado is not on medications.  Her last LDL was 155 on her last lab check.  2. Prediabetes Sydney Alvarado is on GLP-1 now.  Her last A1c was 5.8, and she has good carbohydrate intake control.  Assessment/Plan:   1. Other hyperlipidemia Sydney Alvarado will continue her category 3 plan with change for upcoming [place] trip.  2. Prediabetes Sydney Alvarado will continue GLP-1 with no change in treatment.  We will repeat labs in 2 months.  3. Obesity, starting BMI 33.99 Sydney Alvarado is currently in the action stage of change. As such, her goal is to continue with weight loss efforts. She has agreed to the Category 3 Plan.   Exercise goals: As is.   Behavioral modification strategies: increasing lean protein intake, meal planning and cooking strategies, keeping healthy foods in the home, and planning for success.  Sydney Alvarado has agreed to follow-up with our clinic in 3 weeks. She was informed of the importance of frequent follow-up  visits to maximize her success with intensive lifestyle modifications for her multiple health conditions.   Objective:   Blood pressure (!) 143/81, pulse (!) 59, temperature 98 F (36.7 C), height 5\' 4"  (1.626 m), weight 192 lb (87.1 kg), SpO2 100 %. Body mass index is 32.96 kg/m.  General: Cooperative, alert, well developed, in no acute distress. HEENT: Conjunctivae and lids unremarkable. Cardiovascular: Regular rhythm.  Lungs: Normal work of breathing. Neurologic: No focal deficits.   Lab Results  Component Value Date   CREATININE 0.78 03/06/2022   BUN 9 03/06/2022   NA 141 03/06/2022   K 4.1 03/06/2022   CL 103 03/06/2022   CO2 27 03/06/2022   Lab Results  Component Value Date   ALT 22 03/06/2022   AST 18 03/06/2022   ALKPHOS 70 03/06/2022   BILITOT 0.5 03/06/2022   Lab Results  Component Value Date   HGBA1C 5.8 (H) 06/10/2022   HGBA1C 5.5 04/08/2021   HGBA1C 5.7 (H) 06/15/2019   HGBA1C 5.8 (H) 06/12/2015   Lab Results  Component Value Date   INSULIN 21.0 06/10/2022   Lab Results  Component Value Date   TSH 2.120 06/10/2022   Lab Results  Component Value Date   CHOL 256 (H) 06/10/2022   HDL 60 06/10/2022   LDLCALC 155 (H) 06/10/2022   TRIG 228 (H) 06/10/2022   CHOLHDL 4 04/08/2021   Lab Results  Component Value Date   VD25OH 58.1 06/10/2022  Lab Results  Component Value Date   WBC 9.2 03/06/2022   HGB 13.9 03/06/2022   HCT 42.7 03/06/2022   MCV 89.9 03/06/2022   PLT 210.0 03/06/2022   No results found for: "IRON", "TIBC", "FERRITIN"  Attestation Statements:   Reviewed by clinician on day of visit: allergies, medications, problem list, medical history, surgical history, family history, social history, and previous encounter notes.   I, Burt Knack, am acting as transcriptionist for Reuben Likes, MD.  I have reviewed the above documentation for accuracy and completeness, and I agree with the above. - Reuben Likes, MD

## 2022-08-13 ENCOUNTER — Ambulatory Visit (HOSPITAL_BASED_OUTPATIENT_CLINIC_OR_DEPARTMENT_OTHER)
Admission: RE | Admit: 2022-08-13 | Discharge: 2022-08-13 | Disposition: A | Discharge status: Home / Self Care | Payer: 59 | Source: Ambulatory Visit | Attending: Family Medicine | Admitting: Family Medicine

## 2022-08-13 ENCOUNTER — Encounter (HOSPITAL_BASED_OUTPATIENT_CLINIC_OR_DEPARTMENT_OTHER): Payer: Self-pay

## 2022-08-13 ENCOUNTER — Other Ambulatory Visit (INDEPENDENT_AMBULATORY_CARE_PROVIDER_SITE_OTHER): Payer: Self-pay | Admitting: Family Medicine

## 2022-08-13 DIAGNOSIS — E7849 Other hyperlipidemia: Secondary | ICD-10-CM

## 2022-08-13 DIAGNOSIS — E65 Localized adiposity: Secondary | ICD-10-CM

## 2022-08-13 DIAGNOSIS — E669 Obesity, unspecified: Secondary | ICD-10-CM

## 2022-08-18 ENCOUNTER — Ambulatory Visit (INDEPENDENT_AMBULATORY_CARE_PROVIDER_SITE_OTHER): Payer: 59 | Admitting: Family Medicine

## 2022-08-18 ENCOUNTER — Encounter (INDEPENDENT_AMBULATORY_CARE_PROVIDER_SITE_OTHER): Payer: Self-pay | Admitting: Family Medicine

## 2022-08-18 VITALS — BP 114/77 | HR 66 | Temp 98.2°F | Ht 64.0 in | Wt 189.0 lb

## 2022-08-18 DIAGNOSIS — E669 Obesity, unspecified: Secondary | ICD-10-CM

## 2022-08-18 DIAGNOSIS — R7303 Prediabetes: Secondary | ICD-10-CM | POA: Diagnosis not present

## 2022-08-18 DIAGNOSIS — E7849 Other hyperlipidemia: Secondary | ICD-10-CM

## 2022-08-18 DIAGNOSIS — E66811 Obesity, class 1: Secondary | ICD-10-CM

## 2022-08-18 DIAGNOSIS — Z6832 Body mass index (BMI) 32.0-32.9, adult: Secondary | ICD-10-CM

## 2022-08-18 NOTE — Progress Notes (Signed)
Chief Complaint:   OBESITY Matasha is here to discuss her progress with her obesity treatment plan along with follow-up of her obesity related diagnoses. Treana is on the Category 3 Plan and states she is following her eating plan approximately 60% of the time. Sharmel states she is walking 1 mile for 30 minutes 7 times per week.  Today's visit was #: 5 Starting weight: 198 lbs Starting date: 06/10/2022 Today's weight: 189 lbs Today's date: 08/18/2022 Total lbs lost to date: 9 Total lbs lost since last in-office visit: 3  Interim History: Patient went to [facility] and tried to really focus on the nutritious food when she had the control.  She has been able to recognize there are sometimes more nutritious options in comparison to the food she chooses normally and is able to make the choice to choose those options. She has a birthday party coming up that is being catered with a taco bar truck for her daughter's third birthday.   Subjective:   1. Other hyperlipidemia Patient's last LDL was elevated at 155, HDL 60, and triglycerides 409.  She is not on medications.  2. Prediabetes Patient is now on semaglutide.  Her last A1c was slightly elevated at the end of March.  Assessment/Plan:   1. Other hyperlipidemia We will repeat labs in 1 month (in 2 appointments).  2. Prediabetes We will repeat labs in 1 month (in 2 appointments).  3. BMI 32.0-32.9,adult  4. Obesity with starting BMI of 34.0 Nattaly is currently in the action stage of change. As such, her goal is to continue with weight loss efforts. She has agreed to the Category 3 Plan.   Patient will continue Wegovy 0.5 mg subcu weekly; follow-up on satiety at her next appointment.  Exercise goals: All adults should avoid inactivity. Some physical activity is better than none, and adults who participate in any amount of physical activity gain some health benefits.  Behavioral modification strategies: increasing lean protein intake,  meal planning and cooking strategies, keeping healthy foods in the home, and planning for success.  Aarvi has agreed to follow-up with our clinic in 2 to 3 weeks. She was informed of the importance of frequent follow-up visits to maximize her success with intensive lifestyle modifications for her multiple health conditions.   Objective:   Blood pressure 114/77, pulse 66, temperature 98.2 F (36.8 C), height 5\' 4"  (1.626 m), weight 189 lb (85.7 kg), SpO2 98 %. Body mass index is 32.44 kg/m.  General: Cooperative, alert, well developed, in no acute distress. HEENT: Conjunctivae and lids unremarkable. Cardiovascular: Regular rhythm.  Lungs: Normal work of breathing. Neurologic: No focal deficits.   Lab Results  Component Value Date   CREATININE 0.78 03/06/2022   BUN 9 03/06/2022   NA 141 03/06/2022   K 4.1 03/06/2022   CL 103 03/06/2022   CO2 27 03/06/2022   Lab Results  Component Value Date   ALT 22 03/06/2022   AST 18 03/06/2022   ALKPHOS 70 03/06/2022   BILITOT 0.5 03/06/2022   Lab Results  Component Value Date   HGBA1C 5.8 (H) 06/10/2022   HGBA1C 5.5 04/08/2021   HGBA1C 5.7 (H) 06/15/2019   HGBA1C 5.8 (H) 06/12/2015   Lab Results  Component Value Date   INSULIN 21.0 06/10/2022   Lab Results  Component Value Date   TSH 2.120 06/10/2022   Lab Results  Component Value Date   CHOL 256 (H) 06/10/2022   HDL 60 06/10/2022   LDLCALC  155 (H) 06/10/2022   TRIG 228 (H) 06/10/2022   CHOLHDL 4 04/08/2021   Lab Results  Component Value Date   VD25OH 58.1 06/10/2022   Lab Results  Component Value Date   WBC 9.2 03/06/2022   HGB 13.9 03/06/2022   HCT 42.7 03/06/2022   MCV 89.9 03/06/2022   PLT 210.0 03/06/2022   No results found for: "IRON", "TIBC", "FERRITIN"  Attestation Statements:   Reviewed by clinician on day of visit: allergies, medications, problem list, medical history, surgical history, family history, social history, and previous encounter  notes.  Time spent on visit including pre-visit chart review and post-visit care and charting was 23 minutes.   I, Burt Knack, am acting as transcriptionist for Reuben Likes, MD.  I have reviewed the above documentation for accuracy and completeness, and I agree with the above. - Reuben Likes, MD

## 2022-09-01 ENCOUNTER — Encounter (INDEPENDENT_AMBULATORY_CARE_PROVIDER_SITE_OTHER): Payer: Self-pay | Admitting: Family Medicine

## 2022-09-01 NOTE — Telephone Encounter (Signed)
Please advise 

## 2022-09-08 ENCOUNTER — Encounter (INDEPENDENT_AMBULATORY_CARE_PROVIDER_SITE_OTHER): Payer: Self-pay | Admitting: Family Medicine

## 2022-09-08 ENCOUNTER — Ambulatory Visit (INDEPENDENT_AMBULATORY_CARE_PROVIDER_SITE_OTHER): Payer: 59 | Admitting: Family Medicine

## 2022-09-08 VITALS — BP 135/78 | HR 71 | Temp 98.4°F | Ht 64.0 in | Wt 187.0 lb

## 2022-09-08 DIAGNOSIS — E669 Obesity, unspecified: Secondary | ICD-10-CM

## 2022-09-08 DIAGNOSIS — E7849 Other hyperlipidemia: Secondary | ICD-10-CM | POA: Diagnosis not present

## 2022-09-08 DIAGNOSIS — R7303 Prediabetes: Secondary | ICD-10-CM | POA: Diagnosis not present

## 2022-09-08 DIAGNOSIS — Z6832 Body mass index (BMI) 32.0-32.9, adult: Secondary | ICD-10-CM

## 2022-09-08 MED ORDER — WEGOVY 0.5 MG/0.5ML ~~LOC~~ SOAJ
0.5000 mg | SUBCUTANEOUS | 0 refills | Status: DC
Start: 2022-09-08 — End: 2022-10-02

## 2022-09-08 NOTE — Progress Notes (Unsigned)
Chief Complaint:   OBESITY Sydney Alvarado is here to discuss her progress with her obesity treatment plan along with follow-up of her obesity related diagnoses. Sydney Alvarado is on the Category 3 Plan and states she is following her eating plan approximately 40% of the time. Sydney Alvarado states she is walking and biking for 10 minutes 2 times per week.  Today's visit was #: 6 Starting weight: 198 lbs Starting date: 06/10/2022 Today's weight: 187 lbs Today's date: 09/08/2022 Total lbs lost to date: 11 Total lbs lost since last in-office visit: 2  Interim History: Patient recognizes that when eating out she isn't taking account the quantity of meat is listed as precooked. She is wondering how long she will be on the meal plan.  The more she is on it the more she feels like she needs it. She had some rough times with work over the last 3 weeks.  She ran out of food due to work demands.  For July 4th she and her family will be home and will grill out.  She may do some fireworks as well. She has been eating a bit more fruit like cantaloupe and watermelon and is wondering about the calorie allotment she has. Noticing she isn't hungry in the am on the [facility].  She realizes she needs to walk more because of her knee.   Subjective:   1. Prediabetes Patient's last A1c was 5.8 on GLP-1.  No GI side effects were noted.  2. Other hyperlipidemia Patient's last LDL was 155, HDL 60, and triglycerides 540.  She is not on medications.  Assessment/Plan:   1. Prediabetes Patient will continue GLP-1 with no change in medication or dose.  2. Other hyperlipidemia We will repeat fasting labs in August.  3. BMI 32.0-32.9,adult  4. Obesity with starting BMI of 34.0 Patient agreed to increase Wegovy to 0.5 mg subcu weekly, and we will refill for 1 month.  - Semaglutide-Weight Management (WEGOVY) 0.5 MG/0.5ML SOAJ; Inject 0.5 mg into the skin once a week.  Dispense: 2 mL; Refill: 0  Sydney Alvarado is currently in the action stage of  change. As such, her goal is to continue with weight loss efforts. She has agreed to the Category 3 Plan.   Exercise goals: All adults should avoid inactivity. Some physical activity is better than none, and adults who participate in any amount of physical activity gain some health benefits.  Behavioral modification strategies: increasing lean protein intake, meal planning and cooking strategies, keeping healthy foods in the home, and planning for success.  Sydney Alvarado has agreed to follow-up with our clinic in 3 to 4 weeks. She was informed of the importance of frequent follow-up visits to maximize her success with intensive lifestyle modifications for her multiple health conditions.   Objective:   Blood pressure 135/78, pulse 71, temperature 98.4 F (36.9 C), height 5\' 4"  (1.626 m), weight 187 lb (84.8 kg), SpO2 98 %. Body mass index is 32.1 kg/m.  General: Cooperative, alert, well developed, in no acute distress. HEENT: Conjunctivae and lids unremarkable. Cardiovascular: Regular rhythm.  Lungs: Normal work of breathing. Neurologic: No focal deficits.   Lab Results  Component Value Date   CREATININE 0.78 03/06/2022   BUN 9 03/06/2022   NA 141 03/06/2022   K 4.1 03/06/2022   CL 103 03/06/2022   CO2 27 03/06/2022   Lab Results  Component Value Date   ALT 22 03/06/2022   AST 18 03/06/2022   ALKPHOS 70 03/06/2022   BILITOT 0.5  03/06/2022   Lab Results  Component Value Date   HGBA1C 5.8 (H) 06/10/2022   HGBA1C 5.5 04/08/2021   HGBA1C 5.7 (H) 06/15/2019   HGBA1C 5.8 (H) 06/12/2015   Lab Results  Component Value Date   INSULIN 21.0 06/10/2022   Lab Results  Component Value Date   TSH 2.120 06/10/2022   Lab Results  Component Value Date   CHOL 256 (H) 06/10/2022   HDL 60 06/10/2022   LDLCALC 155 (H) 06/10/2022   TRIG 228 (H) 06/10/2022   CHOLHDL 4 04/08/2021   Lab Results  Component Value Date   VD25OH 58.1 06/10/2022   Lab Results  Component Value Date   WBC 9.2  03/06/2022   HGB 13.9 03/06/2022   HCT 42.7 03/06/2022   MCV 89.9 03/06/2022   PLT 210.0 03/06/2022   No results found for: "IRON", "TIBC", "FERRITIN"  Attestation Statements:   Reviewed by clinician on day of visit: allergies, medications, problem list, medical history, surgical history, family history, social history, and previous encounter notes.   I, Burt Knack, am acting as transcriptionist for Reuben Likes, MD.  I have reviewed the above documentation for accuracy and completeness, and I agree with the above. - Reuben Likes, MD

## 2022-10-02 ENCOUNTER — Encounter (INDEPENDENT_AMBULATORY_CARE_PROVIDER_SITE_OTHER): Payer: Self-pay | Admitting: Family Medicine

## 2022-10-02 ENCOUNTER — Ambulatory Visit (INDEPENDENT_AMBULATORY_CARE_PROVIDER_SITE_OTHER): Payer: 59 | Admitting: Family Medicine

## 2022-10-02 VITALS — BP 132/85 | HR 59 | Temp 98.3°F | Ht 64.0 in | Wt 183.0 lb

## 2022-10-02 DIAGNOSIS — E669 Obesity, unspecified: Secondary | ICD-10-CM | POA: Diagnosis not present

## 2022-10-02 DIAGNOSIS — E7849 Other hyperlipidemia: Secondary | ICD-10-CM

## 2022-10-02 DIAGNOSIS — Z6831 Body mass index (BMI) 31.0-31.9, adult: Secondary | ICD-10-CM | POA: Diagnosis not present

## 2022-10-02 DIAGNOSIS — R7303 Prediabetes: Secondary | ICD-10-CM

## 2022-10-02 MED ORDER — WEGOVY 1 MG/0.5ML ~~LOC~~ SOAJ
1.0000 mg | SUBCUTANEOUS | 0 refills | Status: DC
Start: 2022-10-02 — End: 2022-10-30

## 2022-10-02 NOTE — Progress Notes (Unsigned)
Chief Complaint:   OBESITY Sydney Alvarado is here to discuss her progress with her obesity treatment plan along with follow-up of her obesity related diagnoses. Sydney Alvarado is on the Category 3 Plan and states she is following her eating plan approximately 70-75% of the time. Sydney Alvarado states she is doing exercises for 30 minutes 5-6 times per week.  Today's visit was #: 7 Starting weight: 198 lbs Starting date: 06/10/2022 Today's weight: 183 lbs Today's date: 10/02/2022 Total lbs lost to date: 15 Total lbs lost since last in-office visit: 4  Interim History: Patient is struggling with all or nothing mentality. She occasionally has indulgent moments and feels that she has sabotaged herself. She is working through her own self talk and self doubt.  She realizes that she is making more conscious choices and focusing on mindfulness.  She doesn't have any upcoming events or activities in the next few weeks.  Doesn't anticipate any sabotage.  Subjective:   1. Other hyperlipidemia Patient's last LDL was 155, HDL 60, and triglycerides 161.  She is not on medications.  2. Prediabetes Patient's last A1c was 5.8.  She is on GLP-1 now.  Assessment/Plan:   1. Other hyperlipidemia We will repeat labs at patient's next appointment.  2. Prediabetes Patient will continue to GLP-1 with no change in management.  3. BMI 31.0-31.9,adult  4. Obesity with starting BMI of 34.0 Patient agreed to increase Wegovy to 1 mg SubQ weekly #2 mL, and we will refill for 1 month.    Sydney Alvarado is currently in the action stage of change. As such, her goal is to continue with weight loss efforts. She has agreed to the Category 3 Plan.   Exercise goals: As is.   Behavioral modification strategies: increasing lean protein intake, meal planning and cooking strategies, keeping healthy foods in the home, and planning for success.  Sydney Alvarado has agreed to follow-up with our clinic in 3 to 4 weeks. She was informed of the importance of  frequent follow-up visits to maximize her success with intensive lifestyle modifications for her multiple health conditions.   Objective:   Blood pressure 132/85, pulse (!) 59, temperature 98.3 F (36.8 C), height 5\' 4"  (1.626 m), weight 183 lb (83 kg), SpO2 100%. Body mass index is 31.41 kg/m.  General: Cooperative, alert, well developed, in no acute distress. HEENT: Conjunctivae and lids unremarkable. Cardiovascular: Regular rhythm.  Lungs: Normal work of breathing. Neurologic: No focal deficits.   Lab Results  Component Value Date   CREATININE 0.78 03/06/2022   BUN 9 03/06/2022   NA 141 03/06/2022   K 4.1 03/06/2022   CL 103 03/06/2022   CO2 27 03/06/2022   Lab Results  Component Value Date   ALT 22 03/06/2022   AST 18 03/06/2022   ALKPHOS 70 03/06/2022   BILITOT 0.5 03/06/2022   Lab Results  Component Value Date   HGBA1C 5.8 (H) 06/10/2022   HGBA1C 5.5 04/08/2021   HGBA1C 5.7 (H) 06/15/2019   HGBA1C 5.8 (H) 06/12/2015   Lab Results  Component Value Date   INSULIN 21.0 06/10/2022   Lab Results  Component Value Date   TSH 2.120 06/10/2022   Lab Results  Component Value Date   CHOL 256 (H) 06/10/2022   HDL 60 06/10/2022   LDLCALC 155 (H) 06/10/2022   TRIG 228 (H) 06/10/2022   CHOLHDL 4 04/08/2021   Lab Results  Component Value Date   VD25OH 58.1 06/10/2022   Lab Results  Component Value Date  WBC 9.2 03/06/2022   HGB 13.9 03/06/2022   HCT 42.7 03/06/2022   MCV 89.9 03/06/2022   PLT 210.0 03/06/2022   No results found for: "IRON", "TIBC", "FERRITIN"  Attestation Statements:   Reviewed by clinician on day of visit: allergies, medications, problem list, medical history, surgical history, family history, social history, and previous encounter notes.  Time spent on visit including pre-visit chart review and post-visit care and charting was 30 minutes.   I, Burt Knack, am acting as transcriptionist for Reuben Likes, MD.  I have reviewed  the above documentation for accuracy and completeness, and I agree with the above. - Reuben Likes, MD

## 2022-10-30 ENCOUNTER — Encounter (INDEPENDENT_AMBULATORY_CARE_PROVIDER_SITE_OTHER): Payer: Self-pay | Admitting: Family Medicine

## 2022-10-30 ENCOUNTER — Ambulatory Visit (INDEPENDENT_AMBULATORY_CARE_PROVIDER_SITE_OTHER): Payer: 59 | Admitting: Family Medicine

## 2022-10-30 VITALS — BP 118/71 | HR 67 | Temp 98.2°F | Ht 64.0 in | Wt 174.0 lb

## 2022-10-30 DIAGNOSIS — R7303 Prediabetes: Secondary | ICD-10-CM | POA: Diagnosis not present

## 2022-10-30 DIAGNOSIS — Z6830 Body mass index (BMI) 30.0-30.9, adult: Secondary | ICD-10-CM

## 2022-10-30 DIAGNOSIS — Z6829 Body mass index (BMI) 29.0-29.9, adult: Secondary | ICD-10-CM

## 2022-10-30 DIAGNOSIS — E7849 Other hyperlipidemia: Secondary | ICD-10-CM

## 2022-10-30 DIAGNOSIS — E669 Obesity, unspecified: Secondary | ICD-10-CM

## 2022-10-30 MED ORDER — WEGOVY 1 MG/0.5ML ~~LOC~~ SOAJ: 1.0000 mg | SUBCUTANEOUS | 0 refills | Status: DC

## 2022-10-30 NOTE — Progress Notes (Signed)
Chief Complaint:   OBESITY Sydney Alvarado is here to discuss her progress with her obesity treatment plan along with follow-up of her obesity related diagnoses. Sydney Alvarado is on the Category 3 Plan and states she is following her eating plan approximately 50% of the time. Sydney Alvarado states she is swimming for 30 minutes 4 times per week.  Today's visit was #: 8 Starting weight: 198 lbs Starting date: 06/10/2022 Today's weight: 174 lbs Today's date: 10/30/2022 Total lbs lost to date: 24 Total lbs lost since last in-office visit: 9  Interim History: Patient has bee following plan 50% of time.  She struggled to get all the food in on the 1.0mg  Wegovy.  Last week she had a stomach virus of 36hrs of severe gastrointestinal distress.  The days she felt well enough to eat she ate out a few times.  She did experience a catch 22 of feeling nauseous from medication and not wanting to eat and then nauseous from not eating.  She is going out of town the week before Labor [place] and is already planning her snacks.   She is working on giving herself more grace.  She is trying to incorporate exercise when she can.  Subjective:   1. Other hyperlipidemia Patient is not on medications but her last LDL was 155, HDL 60, and triglycerides 409.  Patient is making lifestyle changes.  2. Prediabetes Patient's last A1c was 5.8 and insulin 21.0.  She is on GLP-1 currently.  Assessment/Plan:   1. Other hyperlipidemia We will repeat labs in October.  2. Prediabetes Patient will continue GLP-1.  We will repeat labs at the end of September or October.  3. BMI 30.0-30.9,adult  4. Obesity with starting BMI of 34.0 We will refill Wegovy 1 mg SubQ weekly for 1 month.   - Semaglutide-Weight Management (WEGOVY) 1 MG/0.5ML SOAJ; Inject 1 mg into the skin once a week.  Dispense: 2 mL; Refill: 0  Sydney Alvarado is currently in the action stage of change. As such, her goal is to continue with weight loss efforts. She has agreed to the  Category 3 Plan.   We will repeat fasting labs at patient's next appointment.   Exercise goals: All adults should avoid inactivity. Some physical activity is better than none, and adults who participate in any amount of physical activity gain some health benefits.  Behavioral modification strategies: increasing lean protein intake, meal planning and cooking strategies, keeping healthy foods in the home, and planning for success.  Sydney Alvarado has agreed to follow-up with our clinic in 5 weeks. She was informed of the importance of frequent follow-up visits to maximize her success with intensive lifestyle modifications for her multiple health conditions.   Objective:   Blood pressure 118/71, pulse 67, temperature 98.2 F (36.8 C), height 5\' 4"  (1.626 m), weight 174 lb (78.9 kg), SpO2 99%. Body mass index is 29.87 kg/m.  General: Cooperative, alert, well developed, in no acute distress. HEENT: Conjunctivae and lids unremarkable. Cardiovascular: Regular rhythm.  Lungs: Normal work of breathing. Neurologic: No focal deficits.   Lab Results  Component Value Date   CREATININE 0.78 03/06/2022   BUN 9 03/06/2022   NA 141 03/06/2022   K 4.1 03/06/2022   CL 103 03/06/2022   CO2 27 03/06/2022   Lab Results  Component Value Date   ALT 22 03/06/2022   AST 18 03/06/2022   ALKPHOS 70 03/06/2022   BILITOT 0.5 03/06/2022   Lab Results  Component Value Date   HGBA1C  5.8 (H) 06/10/2022   HGBA1C 5.5 04/08/2021   HGBA1C 5.7 (H) 06/15/2019   HGBA1C 5.8 (H) 06/12/2015   Lab Results  Component Value Date   INSULIN 21.0 06/10/2022   Lab Results  Component Value Date   TSH 2.120 06/10/2022   Lab Results  Component Value Date   CHOL 256 (H) 06/10/2022   HDL 60 06/10/2022   LDLCALC 155 (H) 06/10/2022   TRIG 228 (H) 06/10/2022   CHOLHDL 4 04/08/2021   Lab Results  Component Value Date   VD25OH 58.1 06/10/2022   Lab Results  Component Value Date   WBC 9.2 03/06/2022   HGB 13.9  03/06/2022   HCT 42.7 03/06/2022   MCV 89.9 03/06/2022   PLT 210.0 03/06/2022   No results found for: "IRON", "TIBC", "FERRITIN"  Attestation Statements:   Reviewed by clinician on day of visit: allergies, medications, problem list, medical history, surgical history, family history, social history, and previous encounter notes.   I, Burt Knack, am acting as transcriptionist for Reuben Likes, MD.  I have reviewed the above documentation for accuracy and completeness, and I agree with the above. - Reuben Likes, MD

## 2022-11-02 ENCOUNTER — Encounter (INDEPENDENT_AMBULATORY_CARE_PROVIDER_SITE_OTHER): Payer: Self-pay | Admitting: Family Medicine

## 2022-11-20 ENCOUNTER — Encounter (INDEPENDENT_AMBULATORY_CARE_PROVIDER_SITE_OTHER): Payer: Self-pay | Admitting: Family Medicine

## 2022-11-20 ENCOUNTER — Telehealth (INDEPENDENT_AMBULATORY_CARE_PROVIDER_SITE_OTHER): Payer: 59 | Admitting: Family Medicine

## 2022-11-20 DIAGNOSIS — R632 Polyphagia: Secondary | ICD-10-CM | POA: Diagnosis not present

## 2022-11-20 DIAGNOSIS — E669 Obesity, unspecified: Secondary | ICD-10-CM

## 2022-11-20 DIAGNOSIS — Z6829 Body mass index (BMI) 29.0-29.9, adult: Secondary | ICD-10-CM

## 2022-11-20 DIAGNOSIS — K760 Fatty (change of) liver, not elsewhere classified: Secondary | ICD-10-CM | POA: Diagnosis not present

## 2022-11-20 MED ORDER — WEGOVY 1.7 MG/0.75ML ~~LOC~~ SOAJ
1.7000 mg | SUBCUTANEOUS | 0 refills | Status: DC
Start: 2022-11-20 — End: 2022-12-08

## 2022-11-20 NOTE — Progress Notes (Signed)
TeleHealth Visit:  This visit was completed with telemedicine (audio/video) technology. Sydney Alvarado has verbally consented to this TeleHealth visit. The patient is located at home, the provider is located at home. The participants in this visit include the listed provider and patient. The visit was conducted today via MyChart video.  Sydney Alvarado is here to discuss her progress with her Sydney treatment plan along with follow-up of her Sydney related diagnoses.   Today's visit was # 9 Starting weight: 198 lbs Starting date: 06/10/22 Weight at last in office visit: 174 lbs on 10/30/22 Total weight loss: 24 lbs at last in office visit on 10/30/22. Today's reported weight (11/20/22):  171.8 lbs  Nutrition Plan: the Category 3 plan - 70% adherence.  Current exercise:  weights at home 3 days per week. Has been too cold to swim in her pool.  Interim History:  She eats out dinner some.  She went on vacation recently and took healthy snacks with her.  Eats 3 meals per day with protein at all meals. She doesn't feel well if she doesn't eat the protein. She tends to be very hard on herself. She drinks diet soda, propel, and water.  Pharmacotherapy: Sydney Alvarado is on Wegovy 1.0 mg SQ weekly Adverse side effects:  nausea when increasing dose. Hunger is moderately controlled.  Cravings are moderately controlled.  Assessment/Plan:  1. Polyphagia Currently this is moderately controlled. Medication(s): Wegovy 1.0 mg SQ weekly.  Reported side effects: Mild nausea when dose increased. Her insurance requires her to increase the dose this refill to 1.7 mg.  Plan: Continue and increase dose Wegovy 1.7 SQ weekly  2.  Hepatic steatosis Mild.  Noted on abdominal scan in 2021. Most recent liver enzymes are normal. Continues to work on weight loss to improve this condition.  Since starting our program she has lost 12.1% of her body weight. Denies abdominal pain or jaundice.  Lab Results   Component Value Date   ALT 22 03/06/2022   AST 18 03/06/2022   ALKPHOS 70 03/06/2022   BILITOT 0.5 03/06/2022    Plan: Continue to monitor liver enzymes. Continue to work on weight loss with meal plan and exercise.   3. Generalized Sydney: Current BMI 29  Pharmacotherapy Plan Continue and increase dose  Wegovy 1.7 SQ weekly  Sydney Alvarado is currently in the action stage of change. As such, her goal is to continue with weight loss efforts.  She has agreed to the Category 3 plan.  Exercise goals: Encouraged her to transition to another form of exercise as the weather cools and she cannot use her pool. Recommended 150 minutes weekly of cardio and 2-3 sessions of resistance training per week.  Behavioral modification strategies: planning for success.  Sydney Alvarado has agreed to follow-up with our clinic in 3 weeks with fasting labs.   No orders of the defined types were placed in this encounter.   Medications Discontinued During This Encounter  Medication Reason   Semaglutide-Weight Management (WEGOVY) 1 MG/0.5ML SOAJ Dose change     Meds ordered this encounter  Medications   Semaglutide-Weight Management (WEGOVY) 1.7 MG/0.75ML SOAJ    Sig: Inject 1.7 mg into the skin once a week.    Dispense:  3 mL    Refill:  0    Order Specific Question:   Supervising Provider    Answer:   Glennis Brink [[id number]]      Objective:   VITALS: Per patient if applicable, see vitals. GENERAL: Alert and in no acute distress. CARDIOPULMONARY:  No increased WOB. Speaking in clear sentences.  PSYCH: Pleasant and cooperative. Speech normal rate and rhythm. Affect is appropriate. Insight and judgement are appropriate. Attention is focused, linear, and appropriate.  NEURO: Oriented as arrived to appointment on time with no prompting.   Attestation Statements:   Reviewed by clinician on day of visit: allergies, medications, problem list, medical history, surgical history, family history, social history, and  previous encounter notes.   This was prepared with the assistance of Engineer, civil (consulting).  Occasional wrong-word or sound-a-like substitutions may have occurred due to the inherent limitations of voice recognition software.

## 2022-12-08 ENCOUNTER — Encounter (INDEPENDENT_AMBULATORY_CARE_PROVIDER_SITE_OTHER): Payer: Self-pay | Admitting: Family Medicine

## 2022-12-08 ENCOUNTER — Ambulatory Visit (INDEPENDENT_AMBULATORY_CARE_PROVIDER_SITE_OTHER): Payer: 59 | Admitting: Family Medicine

## 2022-12-08 VITALS — BP 127/73 | HR 59 | Temp 97.9°F | Ht 64.0 in | Wt 171.0 lb

## 2022-12-08 DIAGNOSIS — E559 Vitamin D deficiency, unspecified: Secondary | ICD-10-CM

## 2022-12-08 DIAGNOSIS — E7849 Other hyperlipidemia: Secondary | ICD-10-CM

## 2022-12-08 DIAGNOSIS — Z6829 Body mass index (BMI) 29.0-29.9, adult: Secondary | ICD-10-CM

## 2022-12-08 DIAGNOSIS — E669 Obesity, unspecified: Secondary | ICD-10-CM

## 2022-12-08 DIAGNOSIS — R7303 Prediabetes: Secondary | ICD-10-CM | POA: Diagnosis not present

## 2022-12-08 MED ORDER — WEGOVY 1.7 MG/0.75ML ~~LOC~~ SOAJ: 1.7000 mg | SUBCUTANEOUS | 0 refills | Status: DC

## 2022-12-08 NOTE — Progress Notes (Unsigned)
Chief Complaint:   OBESITY Sydney Alvarado is here to discuss her progress with her obesity treatment plan along with follow-up of her obesity related diagnoses. Sydney Alvarado is on the Category 3 Plan and states she is following her eating plan approximately 60% of the time. Sydney Alvarado states she is exercising for 20-30 minutes 3 times per week.  Today's visit was #: 10 Starting weight: 198 lbs Starting date: 06/10/2022 Today's weight: 171 lbs Today's date: 12/08/2022 Total lbs lost to date: 27 Total lbs lost since last in-office visit: 3  Interim History: Since last appointment she mentions she had a few moments where she didn't do what she know she needs to do.  This is related to work having increased demands.  On positive note, she is learning that when she eats closer to plan she feels better; feels poorly when she eats off plan.  She does realize when she doesn't get the protein in at breakfast she feels more poorly.    Subjective:   1. Other hyperlipidemia Patient's last LDL was 155, HDL 60, and triglycerides 657.  She is not on medications.  2. Prediabetes Patient is on semaglutide with some sugar cravings, but are controlled.  Her last A1c was 5.8 and insulin 21.0.  3. Vitamin D deficiency Patient is on OTC vitamin D, and she notes fatigue.  Assessment/Plan:   1. Other hyperlipidemia We will check labs today, and we will follow-up at patient's next appointment.  - Lipid Panel With LDL/HDL Ratio  2. Prediabetes We will check labs today, and we will follow-up at patient's next appointment.  - Comprehensive metabolic panel - Hemoglobin A1c - Insulin, random  3. Vitamin D deficiency We will check labs today, and we will follow-up at patient's next appointment.  - VITAMIN D 25 Hydroxy (Vit-D Deficiency, Fractures)  4. BMI 29.0-29.9,adult  5. Obesity with starting BMI of 34.0 We will refill Wegovy 1.7 mg subcu weekly for 1 month.  - Semaglutide-Weight Management (WEGOVY) 1.7  MG/0.75ML SOAJ; Inject 1.7 mg into the skin once a week.  Dispense: 3 mL; Refill: 0  Sydney Alvarado is currently in the action stage of change. As such, her goal is to continue with weight loss efforts. She has agreed to the Category 3 Plan.   Exercise goals: All adults should avoid inactivity. Some physical activity is better than none, and adults who participate in any amount of physical activity gain some health benefits.  Behavioral modification strategies: increasing lean protein intake, meal planning and cooking strategies, keeping healthy foods in the home, and planning for success.  Sydney Alvarado has agreed to follow-up with our clinic in 4 to 5 weeks. She was informed of the importance of frequent follow-up visits to maximize her success with intensive lifestyle modifications for her multiple health conditions.   Sydney Alvarado was informed we would discuss her lab results at her next visit unless there is a critical issue that needs to be addressed sooner. Sydney Alvarado agreed to keep her next visit at the agreed upon time to discuss these results.  Objective:   Blood pressure 127/73, pulse (!) 59, temperature 97.9 F (36.6 C), height 5\' 4"  (1.626 m), weight 171 lb (77.6 kg), SpO2 100%. Body mass index is 29.35 kg/m.  General: Cooperative, alert, well developed, in no acute distress. HEENT: Conjunctivae and lids unremarkable. Cardiovascular: Regular rhythm.  Lungs: Normal work of breathing. Neurologic: No focal deficits.   Lab Results  Component Value Date   CREATININE 0.78 03/06/2022   BUN 9 03/06/2022  NA 141 03/06/2022   K 4.1 03/06/2022   CL 103 03/06/2022   CO2 27 03/06/2022   Lab Results  Component Value Date   ALT 22 03/06/2022   AST 18 03/06/2022   ALKPHOS 70 03/06/2022   BILITOT 0.5 03/06/2022   Lab Results  Component Value Date   HGBA1C 5.8 (H) 06/10/2022   HGBA1C 5.5 04/08/2021   HGBA1C 5.7 (H) 06/15/2019   HGBA1C 5.8 (H) 06/12/2015   Lab Results  Component Value Date   INSULIN  21.0 06/10/2022   Lab Results  Component Value Date   TSH 2.120 06/10/2022   Lab Results  Component Value Date   CHOL 256 (H) 06/10/2022   HDL 60 06/10/2022   LDLCALC 155 (H) 06/10/2022   TRIG 228 (H) 06/10/2022   CHOLHDL 4 04/08/2021   Lab Results  Component Value Date   VD25OH 58.1 06/10/2022   Lab Results  Component Value Date   WBC 9.2 03/06/2022   HGB 13.9 03/06/2022   HCT 42.7 03/06/2022   MCV 89.9 03/06/2022   PLT 210.0 03/06/2022   No results found for: "IRON", "TIBC", "FERRITIN"  Attestation Statements:   Reviewed by clinician on day of visit: allergies, medications, problem list, medical history, surgical history, family history, social history, and previous encounter notes.   I, Burt Knack, am acting as transcriptionist for Reuben Likes, MD.  I have reviewed the above documentation for accuracy and completeness, and I agree with the above. - Reuben Likes, MD

## 2022-12-09 LAB — HEMOGLOBIN A1C
Est. average glucose Bld gHb Est-mCnc: 117 mg/dL
Hgb A1c MFr Bld: 5.7 % — ABNORMAL HIGH (ref 4.8–5.6)

## 2022-12-09 LAB — COMPREHENSIVE METABOLIC PANEL
ALT: 14 IU/L (ref 0–32)
AST: 18 IU/L (ref 0–40)
Albumin: 4.5 g/dL (ref 3.8–4.9)
Alkaline Phosphatase: 94 IU/L (ref 44–121)
BUN/Creatinine Ratio: 17 (ref 9–23)
BUN: 12 mg/dL (ref 6–24)
Bilirubin Total: 0.3 mg/dL (ref 0.0–1.2)
CO2: 21 mmol/L (ref 20–29)
Calcium: 9.8 mg/dL (ref 8.7–10.2)
Chloride: 104 mmol/L (ref 96–106)
Creatinine, Ser: 0.72 mg/dL (ref 0.57–1.00)
Globulin, Total: 3.2 g/dL (ref 1.5–4.5)
Glucose: 85 mg/dL (ref 70–99)
Potassium: 4.3 mmol/L (ref 3.5–5.2)
Sodium: 141 mmol/L (ref 134–144)
Total Protein: 7.7 g/dL (ref 6.0–8.5)
eGFR: 99 mL/min/{1.73_m2} (ref >59)

## 2022-12-09 LAB — LIPID PANEL WITH LDL/HDL RATIO
Cholesterol, Total: 178 mg/dL (ref 100–199)
HDL: 57 mg/dL (ref >39)
LDL Chol Calc (NIH): 104 mg/dL — ABNORMAL HIGH (ref 0–99)
LDL/HDL Ratio: 1.8 ratio (ref 0.0–3.2)
Triglycerides: 91 mg/dL (ref 0–149)
VLDL Cholesterol Cal: 17 mg/dL (ref 5–40)

## 2022-12-09 LAB — INSULIN, RANDOM: INSULIN: 5.7 u[IU]/mL (ref 2.6–24.9)

## 2022-12-09 LAB — VITAMIN D 25 HYDROXY (VIT D DEFICIENCY, FRACTURES): Vit D, 25-Hydroxy: 78.4 ng/mL (ref 30.0–100.0)

## 2023-01-12 ENCOUNTER — Encounter (INDEPENDENT_AMBULATORY_CARE_PROVIDER_SITE_OTHER): Payer: Self-pay | Admitting: Family Medicine

## 2023-01-12 ENCOUNTER — Ambulatory Visit (INDEPENDENT_AMBULATORY_CARE_PROVIDER_SITE_OTHER): Payer: 59 | Admitting: Family Medicine

## 2023-01-12 VITALS — BP 120/62 | HR 74 | Temp 97.8°F | Ht 64.0 in | Wt 167.0 lb

## 2023-01-12 DIAGNOSIS — E7849 Other hyperlipidemia: Secondary | ICD-10-CM | POA: Diagnosis not present

## 2023-01-12 DIAGNOSIS — E669 Obesity, unspecified: Secondary | ICD-10-CM | POA: Diagnosis not present

## 2023-01-12 DIAGNOSIS — R7303 Prediabetes: Secondary | ICD-10-CM | POA: Diagnosis not present

## 2023-01-12 DIAGNOSIS — Z6828 Body mass index (BMI) 28.0-28.9, adult: Secondary | ICD-10-CM | POA: Diagnosis not present

## 2023-01-12 DIAGNOSIS — Z6834 Body mass index (BMI) 34.0-34.9, adult: Secondary | ICD-10-CM

## 2023-01-12 MED ORDER — SEMAGLUTIDE-WEIGHT MANAGEMENT 2.4 MG/0.75ML ~~LOC~~ SOAJ
2.4000 mg | SUBCUTANEOUS | 0 refills | Status: DC
Start: 2023-01-12 — End: 2023-02-08

## 2023-01-12 NOTE — Progress Notes (Unsigned)
Chief Complaint:   OBESITY Sydney Alvarado is here to discuss her progress with her obesity treatment plan along with follow-up of her obesity related diagnoses. Sydney Alvarado is on the Category 3 Plan and states she is following her eating plan approximately 65-70% of the time. Sydney Alvarado states she is walking for 30-40 minutes 5 times per week.  Today's visit was #: 11 Starting weight: 198 lbs Starting date: 06/10/2022 Today's weight: 167 lbs Today's date: 01/12/2023 Total lbs lost to date: 31 Total lbs lost since last in-office visit: 4  Interim History: Patient has been working consistently on following the Category plan since last appointment.  She notices significant GI side effects when eating less nutritious food.  She is working on not feeling so guilty when she doesn't follow the plan. She is working on giving herself more grace. Work is particularly stressful currently as it will be for the next couple of months. She does have some concerns with the upcoming holidays.   Subjective:   1. Other hyperlipidemia Patient's last LDL increased from 55 to 104, HDL decreased from 60 to 57, and triglycerides decreased from 228 to 91.  She is not on medications.  I discussed labs with the patient today.  2. Prediabetes Patient's last A1c was 5.7 on her recent labs.  Patient is on incretin therapy.  Her insulin level has significantly decreased from 21-5.7.  I discussed labs with the patient today.  Assessment/Plan:   1. Other hyperlipidemia Patient's levels are close to goal; significant improvement since her initial appointment.  2. Prediabetes Patient will continue her incretin therapy.  3. BMI 28.0-28.9,adult  4. Obesity with starting BMI of 34.0 Patient agreed to increase Wegovy to 2.4 mg subcu weekly, and we will refill for 1 month.  - Semaglutide-Weight Management 2.4 MG/0.75ML SOAJ; Inject 2.4 mg into the skin once a week.  Dispense: 3 mL; Refill: 0  Varsha is currently in the action stage  of change. As such, her goal is to continue with weight loss efforts. She has agreed to the Category 3 Plan.   Exercise goals: All adults should avoid inactivity. Some physical activity is better than none, and adults who participate in any amount of physical activity gain some health benefits.  Behavioral modification strategies: increasing lean protein intake, meal planning and cooking strategies, keeping healthy foods in the home, and planning for success.  Sydney Alvarado has agreed to follow-up with our clinic in 4 weeks. She was informed of the importance of frequent follow-up visits to maximize her success with intensive lifestyle modifications for her multiple health conditions.   Objective:   Blood pressure 120/62, pulse 74, temperature 97.8 F (36.6 C), height 5\' 4"  (1.626 m), weight 167 lb (75.8 kg), SpO2 97%. Body mass index is 28.67 kg/m.  General: Cooperative, alert, well developed, in no acute distress. HEENT: Conjunctivae and lids unremarkable. Cardiovascular: Regular rhythm.  Lungs: Normal work of breathing. Neurologic: No focal deficits.   Lab Results  Component Value Date   CREATININE 0.72 12/08/2022   BUN 12 12/08/2022   NA 141 12/08/2022   K 4.3 12/08/2022   CL 104 12/08/2022   CO2 21 12/08/2022   Lab Results  Component Value Date   ALT 14 12/08/2022   AST 18 12/08/2022   ALKPHOS 94 12/08/2022   BILITOT 0.3 12/08/2022   Lab Results  Component Value Date   HGBA1C 5.7 (H) 12/08/2022   HGBA1C 5.8 (H) 06/10/2022   HGBA1C 5.5 04/08/2021   HGBA1C 5.7 (  H) 06/15/2019   HGBA1C 5.8 (H) 06/12/2015   Lab Results  Component Value Date   INSULIN 5.7 12/08/2022   INSULIN 21.0 06/10/2022   Lab Results  Component Value Date   TSH 2.120 06/10/2022   Lab Results  Component Value Date   CHOL 178 12/08/2022   HDL 57 12/08/2022   LDLCALC 104 (H) 12/08/2022   TRIG 91 12/08/2022   CHOLHDL 4 04/08/2021   Lab Results  Component Value Date   VD25OH 78.4 12/08/2022    VD25OH 58.1 06/10/2022   Lab Results  Component Value Date   WBC 9.2 03/06/2022   HGB 13.9 03/06/2022   HCT 42.7 03/06/2022   MCV 89.9 03/06/2022   PLT 210.0 03/06/2022   No results found for: "IRON", "TIBC", "FERRITIN"  Attestation Statements:   Reviewed by clinician on day of visit: allergies, medications, problem list, medical history, surgical history, family history, social history, and previous encounter notes.   I, Burt Knack, am acting as transcriptionist for Reuben Likes, MD.  I have reviewed the above documentation for accuracy and completeness, and I agree with the above. - Reuben Likes, MD

## 2023-02-05 ENCOUNTER — Telehealth (INDEPENDENT_AMBULATORY_CARE_PROVIDER_SITE_OTHER): Payer: Self-pay | Admitting: Family Medicine

## 2023-02-05 NOTE — Telephone Encounter (Signed)
Pt needs refill of Wegovy as appt needed to be R/S. Spoke w/ pt, no constipation, no difference in appetite, no N &V, Has noticed some heartburn with spicy foods in the evening. Ok to refill?

## 2023-02-05 NOTE — Telephone Encounter (Signed)
Called pt to r/s her 11/25 appt with Dr. Marquis Lunch, pt agreed to r/s with Dr. Abundio Miu on 12/2. Pt stated that she will use her last dose of Wegovy on 11/18 and wanted to know if she could get a refill before her 12/2 appt since it had to be r/s due to the provider? Please call the pt and follow up. AIR

## 2023-02-05 NOTE — Telephone Encounter (Signed)
L/mess for pt-CS

## 2023-02-08 ENCOUNTER — Other Ambulatory Visit (INDEPENDENT_AMBULATORY_CARE_PROVIDER_SITE_OTHER): Payer: Self-pay | Admitting: Family Medicine

## 2023-02-08 DIAGNOSIS — E66811 Obesity, class 1: Secondary | ICD-10-CM

## 2023-02-09 ENCOUNTER — Encounter (INDEPENDENT_AMBULATORY_CARE_PROVIDER_SITE_OTHER): Payer: Self-pay

## 2023-02-09 ENCOUNTER — Ambulatory Visit (INDEPENDENT_AMBULATORY_CARE_PROVIDER_SITE_OTHER): Payer: 59 | Admitting: Family Medicine

## 2023-02-09 MED ORDER — SEMAGLUTIDE-WEIGHT MANAGEMENT 2.4 MG/0.75ML ~~LOC~~ SOAJ: 2.4000 mg | SUBCUTANEOUS | 0 refills | Status: DC

## 2023-02-09 NOTE — Telephone Encounter (Signed)
Prescription sent, Thanks, CS

## 2023-02-09 NOTE — Telephone Encounter (Signed)
PA started

## 2023-02-16 ENCOUNTER — Ambulatory Visit (INDEPENDENT_AMBULATORY_CARE_PROVIDER_SITE_OTHER): Payer: 59 | Admitting: Internal Medicine

## 2023-02-16 ENCOUNTER — Encounter (INDEPENDENT_AMBULATORY_CARE_PROVIDER_SITE_OTHER): Payer: Self-pay | Admitting: Internal Medicine

## 2023-02-16 VITALS — BP 139/79 | HR 58 | Temp 97.8°F | Ht 64.0 in | Wt 166.0 lb

## 2023-02-16 DIAGNOSIS — R638 Other symptoms and signs concerning food and fluid intake: Secondary | ICD-10-CM | POA: Diagnosis not present

## 2023-02-16 DIAGNOSIS — Z6828 Body mass index (BMI) 28.0-28.9, adult: Secondary | ICD-10-CM | POA: Diagnosis not present

## 2023-02-16 DIAGNOSIS — E66811 Obesity, class 1: Secondary | ICD-10-CM

## 2023-02-16 DIAGNOSIS — E669 Obesity, unspecified: Secondary | ICD-10-CM

## 2023-02-16 DIAGNOSIS — R7303 Prediabetes: Secondary | ICD-10-CM

## 2023-02-16 MED ORDER — SEMAGLUTIDE-WEIGHT MANAGEMENT 2.4 MG/0.75ML ~~LOC~~ SOAJ: 2.4000 mg | SUBCUTANEOUS | 0 refills | Status: DC

## 2023-02-16 NOTE — Assessment & Plan Note (Signed)
Her most recent A1c was 5.7 and improved.  She is currently on [facility] for pharmacoprophylaxis.  She may benefit from metformin for augmentation if there is a degree of tachyphylaxis on Wegovy.  She is still having some breakthrough hunger and cravings.  We discussed the role of physical activity and weight loss maintenance.

## 2023-02-16 NOTE — Assessment & Plan Note (Signed)
She has increased orexigenic signaling, impaired satiety and inhibitory control. This is secondary to an abnormal energy regulation system and pathological neurohormonal pathways characteristic of excess adiposity.  In addition to nutritional and behavioral strategies she benefits from ongoing pharmacotherapy.

## 2023-02-16 NOTE — Progress Notes (Signed)
Office: [phone]  /  Fax: [phone]  Weight Summary And Biometrics  Vitals Temp: 97.8 F (36.6 C) BP: 139/79 Pulse Rate: (!) 58 SpO2: 100 %   Anthropometric Measurements Height: 5\' 4"  (1.626 m) Weight: 166 lb (75.3 kg) BMI (Calculated): 28.48 Weight at Last Visit: 167 lb Weight Lost Since Last Visit: 1 lb Weight Gained Since Last Visit: 0 lb Starting Weight: 198 lb Total Weight Loss (lbs): 32 lb (14.5 kg)   Body Composition  Body Fat %: 35.5 % Fat Mass (lbs): 59 lbs Muscle Mass (lbs): 101.8 lbs Total Body Water (lbs): 73 lbs Visceral Fat Rating : 8    No data recorded Today's Visit #: 11  Starting Date: 06/10/22   Subjective   Chief Complaint: Obesity  Sydney Alvarado is here to discuss her progress with her obesity treatment plan. She is on the the Category 3 Plan and states she is following her eating plan approximately 60 % of the time. She states she is exercising 30 minutes 3 times per week.  Interval History:   Discussed the use of AI scribe software for clinical note transcription with the patient, who gave verbal consent to proceed.  History of Present Illness   This is my first encounter with patient patient presents today for follow-up on weight management. She reports a recent weight loss of one pound, despite a busy week with personal commitments and less adherence to her usual diet and exercise routine. The patient acknowledges the importance of slow, sustainable weight loss and is aware of the potential negative effects of rapid weight loss on the body's metabolism.  The patient admits to struggling with portion control, often feeling stuffed after meals and sometimes going for seconds. She expresses a desire to break this cycle and understand what constitutes a normal portion. She is learning about mindfulness eating, aiming to start with smaller plates, balance her meals with fruits, vegetables, and proteins, and take time to eat her meals.  The  patient also reports challenges with eating out, especially during busy periods. She acknowledges the need for meal prepping and planning to avoid defaulting to unhealthy choices. She expresses concern about maintaining her progress during the upcoming holiday season and the rest of the year, fearing that she might fall into a pattern of skipping her diet plan due to the rush of life.  The patient's highest recorded weight was 207 pounds in 2019, and she has been gradually losing weight since then. She is currently at 166 pounds, with a body fat percentage of 35%. The patient acknowledges the need for patience in her weight loss journey, understanding that this is a lifelong commitment. She also notes that she feels physically unwell when she does not eat healthily.  The patient is on [facility] for weight management. She reports that she still experiences cravings for sweets after meals, which she believes might be habitual. She is learning to manage these cravings by making healthier choices and using mindfulness eating strategies. She is also considering incorporating more physical activity into her routine. The patient's medication refill will be sent to a local pharmacy.    Orexigenic Control:  Reports problems with appetite and hunger signals.  Denies problems with satiety and satiation.  Reports problems with eating patterns and portion control.  Reports abnormal cravings. Reports feeling deprived or restricted.   Barriers identified: strong hunger signals and impaired satiety / inhibitory control.   Pharmacotherapy for weight loss: She is currently taking Wegovy with adequate clinical response  and without side effects..   Assessment and Plan   Treatment Plan For Obesity:  Recommended Dietary Goals  [name] is currently in the action stage of change. As such, her goal is to continue weight management plan. She has agreed to: continue current plan  Behavioral Intervention  We discussed  the following Behavioral Modification Strategies today: continue to work on maintaining a reduced calorie state, getting the recommended amount of protein, incorporating whole foods, making healthy choices, staying well hydrated and practicing mindfulness when eating..  Additional resources provided today: Handout on mindfulness eating and how to stay on track during the holidays.  Recommended Physical Activity Goals  [name] has been advised to work up to 150 minutes of moderate intensity aerobic activity a week and strengthening exercises 2-3 times per week for cardiovascular health, weight loss maintenance and preservation of muscle mass.   She has agreed to :  continue to gradually increase the amount and intensity of exercise   Pharmacotherapy  We discussed various medication options to help [facility] with her weight loss efforts and we both agreed to : continue current anti-obesity medication regimen  Associated Conditions Addressed Today  Prediabetes Assessment & Plan: Her most recent A1c was 5.7 and improved.  She is currently on [facility] for pharmacoprophylaxis.  She may benefit from metformin for augmentation if there is a degree of tachyphylaxis on Wegovy.  She is still having some breakthrough hunger and cravings.  We discussed the role of physical activity and weight loss maintenance.   Obesity with starting BMI of 34.0 Assessment & Plan: She presents for weight management, having reduced her weight from a peak of 207 lbs in 2019 to 166 lbs. Despite this success, she struggles with impatience and occasional overindulgence, particularly during holidays, and finds portion control challenging, especially when eating out or during busy periods. [name] has been beneficial in her weight loss journey. We discussed the importance of sustainable weight loss at a rate of 1-2 lbs per week to prevent metabolic slowdown and weight regain, highlighting the role of protein in satiety and the risks  associated with rapid weight loss, such as muscle loss and adaptive thermogenesis. We encouraged mindfulness eating strategies to help with habitual cravings and portion control. The plan includes continuing Wegovy, with a refill sent to [facility], encouraging mindfulness eating strategies like using smaller plates and eating slowly, emphasizing balanced meals with adequate protein, advising on meal prepping and planning to avoid unhealthy choices, discussing the importance of physical activity and suggesting incorporating it into her daily routine, even if it involves activities like chasing a grandchild, and providing handouts on mindfulness eating and staying on track during the holidays.  Orders: -     Semaglutide-Weight Management; Inject 2.4 mg into the skin once a week.  Dispense: 3 mL; Refill: 0  Abnormal food appetite Assessment & Plan: She has increased orexigenic signaling, impaired satiety and inhibitory control. This is secondary to an abnormal energy regulation system and pathological neurohormonal pathways characteristic of excess adiposity.  In addition to nutritional and behavioral strategies she benefits from ongoing pharmacotherapy.             Objective   Physical Exam:  Blood pressure 139/79, pulse (!) 58, temperature 97.8 F (36.6 C), height 5\' 4"  (1.626 m), weight 166 lb (75.3 kg), SpO2 100%. Body mass index is 28.49 kg/m.  General: She is overweight, cooperative, alert, well developed, and in no acute distress. PSYCH: Has normal mood, affect and thought process.  HEENT: EOMI, sclerae are anicteric. Lungs: Normal breathing effort, no conversational dyspnea. Extremities: No edema.  Neurologic: No gross sensory or motor deficits. No tremors or fasciculations noted.    Diagnostic Data Reviewed:  BMET    Component Value Date/Time   NA 141 12/08/2022 0942   K 4.3 12/08/2022 0942   CL 104 12/08/2022 0942   CO2 21 12/08/2022 0942   GLUCOSE 85  12/08/2022 0942   GLUCOSE 86 03/06/2022 1510   BUN 12 12/08/2022 0942   CREATININE 0.72 12/08/2022 0942   CREATININE 0.80 06/12/2015 1048   CALCIUM 9.8 12/08/2022 0942   GFRNONAA 82 06/15/2019 0948   GFRAA 94 06/15/2019 0948   Lab Results  Component Value Date   HGBA1C 5.7 (H) 12/08/2022   HGBA1C 5.8 (H) 06/12/2015   Lab Results  Component Value Date   INSULIN 5.7 12/08/2022   INSULIN 21.0 06/10/2022   Lab Results  Component Value Date   TSH 2.120 06/10/2022   CBC    Component Value Date/Time   WBC 9.2 03/06/2022 1510   RBC 4.75 03/06/2022 1510   HGB 13.9 03/06/2022 1510   HGB 14.3 06/15/2019 0948   HCT 42.7 03/06/2022 1510   HCT 43.8 06/15/2019 0948   PLT 210.0 03/06/2022 1510   PLT 250 06/15/2019 0948   MCV 89.9 03/06/2022 1510   MCV 88 06/15/2019 0948   MCH 28.8 06/15/2019 0948   MCH 28.8 02/27/2019 0459   MCHC 32.4 03/06/2022 1510   RDW 14.0 03/06/2022 1510   RDW 13.0 06/15/2019 0948   Iron Studies No results found for: "IRON", "TIBC", "FERRITIN", "IRONPCTSAT" Lipid Panel     Component Value Date/Time   CHOL 178 12/08/2022 0942   TRIG 91 12/08/2022 0942   HDL 57 12/08/2022 0942   CHOLHDL 4 04/08/2021 1018   VLDL 37.8 04/08/2021 1018   LDLCALC 104 (H) 12/08/2022 0942   Hepatic Function Panel     Component Value Date/Time   PROT 7.7 12/08/2022 0942   ALBUMIN 4.5 12/08/2022 0942   AST 18 12/08/2022 0942   ALT 14 12/08/2022 0942   ALKPHOS 94 12/08/2022 0942   BILITOT 0.3 12/08/2022 0942      Component Value Date/Time   TSH 2.120 06/10/2022 0958   Nutritional Lab Results  Component Value Date   VD25OH 78.4 12/08/2022   VD25OH 58.1 06/10/2022    Follow-Up   Return in about 4 weeks (around 03/16/2023) for Dr.Ukleja.. She was informed of the importance of frequent follow up visits to maximize her success with intensive lifestyle modifications for her multiple health conditions.  Attestation Statement   Reviewed by clinician on day of visit:  allergies, medications, problem list, medical history, surgical history, family history, social history, and previous encounter notes.     Worthy Rancher, MD

## 2023-02-16 NOTE — Assessment & Plan Note (Signed)
She presents for weight management, having reduced her weight from a peak of 207 lbs in 2019 to 166 lbs. Despite this success, she struggles with impatience and occasional overindulgence, particularly during holidays, and finds portion control challenging, especially when eating out or during busy periods. [name] has been beneficial in her weight loss journey. We discussed the importance of sustainable weight loss at a rate of 1-2 lbs per week to prevent metabolic slowdown and weight regain, highlighting the role of protein in satiety and the risks associated with rapid weight loss, such as muscle loss and adaptive thermogenesis. We encouraged mindfulness eating strategies to help with habitual cravings and portion control. The plan includes continuing Wegovy, with a refill sent to [facility], encouraging mindfulness eating strategies like using smaller plates and eating slowly, emphasizing balanced meals with adequate protein, advising on meal prepping and planning to avoid unhealthy choices, discussing the importance of physical activity and suggesting incorporating it into her daily routine, even if it involves activities like chasing a grandchild, and providing handouts on mindfulness eating and staying on track during the holidays.

## 2023-03-05 ENCOUNTER — Ambulatory Visit (INDEPENDENT_AMBULATORY_CARE_PROVIDER_SITE_OTHER): Payer: 59 | Admitting: Family Medicine

## 2023-03-05 VITALS — BP 121/70 | HR 65 | Temp 97.8°F | Ht 64.0 in | Wt 161.0 lb

## 2023-03-05 DIAGNOSIS — Z6834 Body mass index (BMI) 34.0-34.9, adult: Secondary | ICD-10-CM

## 2023-03-05 DIAGNOSIS — E7800 Pure hypercholesterolemia, unspecified: Secondary | ICD-10-CM | POA: Diagnosis not present

## 2023-03-05 DIAGNOSIS — R7303 Prediabetes: Secondary | ICD-10-CM

## 2023-03-05 DIAGNOSIS — E66811 Obesity, class 1: Secondary | ICD-10-CM | POA: Diagnosis not present

## 2023-03-05 DIAGNOSIS — Z6827 Body mass index (BMI) 27.0-27.9, adult: Secondary | ICD-10-CM

## 2023-03-05 MED ORDER — SEMAGLUTIDE-WEIGHT MANAGEMENT 2.4 MG/0.75ML ~~LOC~~ SOAJ: 2.4000 mg | SUBCUTANEOUS | 1 refills | Status: DC

## 2023-03-05 NOTE — Assessment & Plan Note (Signed)
Patient is going very well on GLP1.  She is on 2.4mg  of Wegovy weekly.  Last A1c slightly elevated at 5.7.  No significant side effects of med.  Higher intake of carbs over the holiday season.  Continue GLP1.  Repeat labs in February.

## 2023-03-05 NOTE — Progress Notes (Signed)
   SUBJECTIVE:  Chief Complaint: Obesity  Interim History: Patient had a great Thanksgiving- granddaughter was born on November 25th.  Life has been very busy and struggling.  She is trying to give herself grace.  She is trying to ensure she is getting nutrition in daily and isn't sedentary doing more NEAT.  She still has quite a bit more to do to prep for Christmas.  She is incorporating a protein shake.    Sydney Alvarado is here to discuss her progress with her obesity treatment plan. She is on the Category 3 Plan and states she is following her eating plan approximately 50 % of the time. She states she is working around the house.  OBJECTIVE: Visit Diagnoses: Problem List Items Addressed This Visit       Other   Pure hypercholesterolemia - Primary   Not on statin daily but improved LDL from 150s to 100s.  She continues to work on food intake and mindful macro consumption.  She is on [facility] and doing well on it.  Will need repeat labs in February.      Class 1 obesity with serious comorbidity and body mass index (BMI) of 34.0 to 34.9 in adult   Relevant Medications   Semaglutide-Weight Management 2.4 MG/0.75ML SOAJ   Prediabetes   Patient is going very well on GLP1.  She is on 2.4mg  of Wegovy weekly.  Last A1c slightly elevated at 5.7.  No significant side effects of med.  Higher intake of carbs over the holiday season.  Continue GLP1.  Repeat labs in February.      Other Visit Diagnoses       BMI 27.0-27.9,adult           Vitals Temp: 97.8 F (36.6 C) BP: 121/70 Pulse Rate: 65 SpO2: 99 %   Anthropometric Measurements Height: 5\' 4"  (1.626 m) Weight: 161 lb (73 kg) BMI (Calculated): 27.62 Weight at Last Visit: 166 lb Weight Lost Since Last Visit: 5 Weight Gained Since Last Visit: 0 Starting Weight: 198 lb Total Weight Loss (lbs): 37 lb (16.8 kg)   Body Composition  Body Fat %: 33.7 % Fat Mass (lbs): 54.6 lbs Muscle Mass (lbs): 101.8 lbs Total Body Water (lbs): 71.8  lbs Visceral Fat Rating : 8   Other Clinical Data Today's Visit #: 12 Starting Date: 06/10/22     ASSESSMENT AND PLAN:  Diet: Sydney Alvarado is currently in the action stage of change. As such, her goal is to continue with weight loss efforts. She has agreed to Category 3 Plan.  Exercise: Sydney Alvarado has been instructed that some exercise is better than none for weight loss and overall health benefits.   Behavior Modification:  We discussed the following Behavioral Modification Strategies today: increasing lean protein intake, increasing vegetables, meal planning and cooking strategies, better snacking choices, holiday eating strategies, and planning for success. We discussed various medication options to help [facility] with her weight loss efforts and we both agreed to refill Wegovy at 2.4mg  weekly.  No follow-ups on file.Marland Kitchen She was informed of the importance of frequent follow up visits to maximize her success with intensive lifestyle modifications for her multiple health conditions.  Attestation Statements:   Reviewed by clinician on day of visit: allergies, medications, problem list, medical history, surgical history, family history, social history, and previous encounter notes.    Reuben Likes, MD

## 2023-03-05 NOTE — Assessment & Plan Note (Signed)
Not on statin daily but improved LDL from 150s to 100s.  She continues to work on food intake and mindful macro consumption.  She is on [facility] and doing well on it.  Will need repeat labs in February.

## 2023-04-16 ENCOUNTER — Encounter (INDEPENDENT_AMBULATORY_CARE_PROVIDER_SITE_OTHER): Payer: Self-pay | Admitting: Family Medicine

## 2023-04-16 ENCOUNTER — Ambulatory Visit (INDEPENDENT_AMBULATORY_CARE_PROVIDER_SITE_OTHER): Payer: 59 | Admitting: Family Medicine

## 2023-04-16 VITALS — BP 113/76 | HR 70 | Temp 98.2°F | Ht 64.0 in | Wt 157.0 lb

## 2023-04-16 DIAGNOSIS — E7800 Pure hypercholesterolemia, unspecified: Secondary | ICD-10-CM | POA: Diagnosis not present

## 2023-04-16 DIAGNOSIS — Z6826 Body mass index (BMI) 26.0-26.9, adult: Secondary | ICD-10-CM

## 2023-04-16 DIAGNOSIS — E66811 Obesity, class 1: Secondary | ICD-10-CM

## 2023-04-16 DIAGNOSIS — R7303 Prediabetes: Secondary | ICD-10-CM

## 2023-04-16 DIAGNOSIS — Z6834 Body mass index (BMI) 34.0-34.9, adult: Secondary | ICD-10-CM

## 2023-04-16 MED ORDER — SEMAGLUTIDE-WEIGHT MANAGEMENT 2.4 MG/0.75ML ~~LOC~~ SOAJ: 2.4000 mg | SUBCUTANEOUS | 1 refills | Status: DC

## 2023-04-16 NOTE — Assessment & Plan Note (Signed)
Last LDL still slightly elevated

## 2023-04-16 NOTE — Assessment & Plan Note (Signed)
Prior A1c 5.7 in September of 2024.  Patient has been slightly more indulgent recently.

## 2023-04-16 NOTE — Progress Notes (Unsigned)
   SUBJECTIVE:  Chief Complaint: Obesity  Interim History: Patient dealing with quite a bit of stress at work.  Work is having many demands, she will have to lay someone off, increased deadlines.  She acknowledges she has been less consistent in her physical activity.  She is doing something 3 times a week and while not doing as much as she wants she is still doing something.  She did find some of the indulgent foods she wanted didn't take as decadently as she remembered.   Sydney Alvarado is here to discuss her progress with her obesity treatment plan. She is on the Category 3 Plan and states she is following her eating plan approximately 70 % of the time. She states she is exercising 10 minutes 3 times per week.   OBJECTIVE: Visit Diagnoses: Problem List Items Addressed This Visit       Other   Class 1 obesity with serious comorbidity and body mass index (BMI) of 34.0 to 34.9 in adult   Relevant Medications   Semaglutide-Weight Management 2.4 MG/0.75ML SOAJ    Vitals Temp: 98.2 F (36.8 C) BP: 113/76 Pulse Rate: 70 SpO2: 100 %   Anthropometric Measurements Height: 5\' 4"  (1.626 m) Weight: 157 lb (71.2 kg) BMI (Calculated): 26.94 Weight at Last Visit: 161 lb Weight Lost Since Last Visit: 4 lb Weight Gained Since Last Visit: 0 Starting Weight: 198 lb Total Weight Loss (lbs): 41 lb (18.6 kg)   Body Composition  Body Fat %: 33.2 % Fat Mass (lbs): 52.4 lbs Muscle Mass (lbs): 99.8 lbs Total Body Water (lbs): 70.2 lbs Visceral Fat Rating : 7   Other Clinical Data Today's Visit #: 13 Starting Date: 06/10/22     ASSESSMENT AND PLAN:  Diet: Sydney Alvarado is currently in the action stage of change. As such, her goal is to continue with weight loss efforts. She has agreed to Category 3 Plan.  Exercise: Sydney Alvarado has been instructed that some exercise is better than none for weight loss and overall health benefits.   Behavior Modification:  We discussed the following Behavioral  Modification Strategies today: increasing lean protein intake, decreasing simple carbohydrates, increasing vegetables, no skipping meals, meal planning and cooking strategies, and better snacking choices.   Return in about 6 weeks (around 05/28/2023) for fasting labs and IC.Marland Kitchen She was informed of the importance of frequent follow up visits to maximize her success with intensive lifestyle modifications for her multiple health conditions.  Attestation Statements:   Reviewed by clinician on day of visit: allergies, medications, problem list, medical history, surgical history, family history, social history, and previous encounter notes.    Reuben Likes, MD

## 2023-04-26 NOTE — Assessment & Plan Note (Signed)
 Doing well on semaglutide  with significant control of food choice and intake quantity.  Plan to repeat IC in 6 weeks to reasses RMR.

## 2023-05-27 ENCOUNTER — Ambulatory Visit (INDEPENDENT_AMBULATORY_CARE_PROVIDER_SITE_OTHER): Payer: 59 | Admitting: Family Medicine

## 2023-05-27 VITALS — BP 108/69 | HR 59 | Temp 98.3°F | Ht 64.0 in | Wt 160.0 lb

## 2023-05-27 DIAGNOSIS — R0602 Shortness of breath: Secondary | ICD-10-CM | POA: Diagnosis not present

## 2023-05-27 DIAGNOSIS — E7849 Other hyperlipidemia: Secondary | ICD-10-CM

## 2023-05-27 DIAGNOSIS — E66811 Obesity, class 1: Secondary | ICD-10-CM

## 2023-05-27 DIAGNOSIS — R7303 Prediabetes: Secondary | ICD-10-CM

## 2023-05-27 DIAGNOSIS — Z6827 Body mass index (BMI) 27.0-27.9, adult: Secondary | ICD-10-CM

## 2023-05-27 DIAGNOSIS — E559 Vitamin D deficiency, unspecified: Secondary | ICD-10-CM

## 2023-05-27 NOTE — Assessment & Plan Note (Signed)
 RMR at initial appointment of 1786.  She has done well with implementing activity and symptoms were improving until recently when she was ill then couldn't exercise.  Repeat RMR today of 1282.  Will decrease category plan to 2.

## 2023-05-27 NOTE — Assessment & Plan Note (Addendum)
 See anthropometric data.  Has lost 38lbs since starting program.  RMR down 500 calories from 1 year ago.  Will decrease to Cat 2 plan  Anthropometric Measurements Height: 5\' 4"  (1.626 m) Weight: 160 lb (72.6 kg) BMI (Calculated): 27.45 Weight at Last Visit: 157 lb Weight Lost Since Last Visit: 0 Weight Gained Since Last Visit: 3 lb Starting Weight: 198 lb Total Weight Loss (lbs): 38 lb (17.2 kg) Body Composition  Body Fat %: 33.6 % Fat Mass (lbs): 53.8 lbs Muscle Mass (lbs): 101.2 lbs Total Body Water (lbs): 71.6 lbs Visceral Fat Rating : 8 Other Clinical Data RMR: 1282 Fasting: yes Labs: yes Today's Visit #: 14 Starting Date: 06/10/22

## 2023-05-27 NOTE — Progress Notes (Signed)
 SUBJECTIVE:  Chief Complaint: Obesity  Interim History: Patient reports less exercise than she did previously and wasn't as strict on her meal plan as she was previously.  She mentions she had a rough few days with some illness then struggled to get back on plan.  She is recognizing the slight deviations and is ready to recommit.Work is particularly intense and she is anticipating that being the case for the next couple of months.  Sydney Alvarado is here to discuss her progress with her obesity treatment plan. She is on the Category 3 Plan and states she is following her eating plan approximately 60 % of the time. She states she exercising 20-30 mins 2 times weekly.   OBJECTIVE: Visit Diagnoses: Problem List Items Addressed This Visit       Other   Class 1 obesity with serious comorbidity and body mass index (BMI) of 34.0 to 34.9 in adult   See anthropometric data.  Has lost 38lbs since starting program.  RMR down 500 calories from 1 year ago.  Will decrease to Cat 2 plan  Anthropometric Measurements Height: 5\' 4"  (1.626 m) Weight: 160 lb (72.6 kg) BMI (Calculated): 27.45 Weight at Last Visit: 157 lb Weight Lost Since Last Visit: 0 Weight Gained Since Last Visit: 3 lb Starting Weight: 198 lb Total Weight Loss (lbs): 38 lb (17.2 kg) Body Composition  Body Fat %: 33.6 % Fat Mass (lbs): 53.8 lbs Muscle Mass (lbs): 101.2 lbs Total Body Water (lbs): 71.6 lbs Visceral Fat Rating : 8 Other Clinical Data RMR: 1282 Fasting: yes Labs: yes Today's Visit #: 14 Starting Date: 06/10/22       Prediabetes - Primary   Patient has been working on food intake control in terms of macronutrients.  She is also taking GLP analogue medication.  Needs repeat labs today- CMP, A1c and Insulin level.      Relevant Orders   Comprehensive metabolic panel (Completed)   Hemoglobin A1c (Completed)   Insulin, random (Completed)   SOBOE (shortness of breath on exertion)   RMR at initial appointment of  1786.  She has done well with implementing activity and symptoms were improving until recently when she was ill then couldn't exercise.  Repeat RMR today of 1282.  Will decrease category plan to 2.      Other hyperlipidemia   Needs repeat FLP today to.  Patient voices concern that her cholesterol will not have decreased as she is expecting.   Will discuss results at next appointment.      Relevant Orders   Lipid Panel With LDL/HDL Ratio (Completed)   Vitamin D deficiency   Repeat Vitamin D level today.  Will discuss replacement at next appointment.      Relevant Orders   VITAMIN D 25 Hydroxy (Vit-D Deficiency, Fractures) (Completed)   Other Visit Diagnoses       BMI 27.0-27.9,adult           No data recorded  Vitals:   05/27/23 0900  BP: 108/69  Pulse: (!) 59  Temp: 98.3 F (36.8 C)  SpO2: 100%        ASSESSMENT AND PLAN:  Diet: Tenna is currently in the action stage of change. As such, her goal is to continue with weight loss efforts and has agreed to the Category 2 Plan.   Exercise:  For substantial health benefits, adults should do at least 150 minutes (2 hours and 30 minutes) a week of moderate-intensity, or 75 minutes (1 hour and 15 minutes)  a week of vigorous-intensity aerobic physical activity, or an equivalent combination of moderate- and vigorous-intensity aerobic activity. Aerobic activity should be performed in episodes of at least 10 minutes, and preferably, it should be spread throughout the week.  Behavior Modification:  We discussed the following Behavioral Modification Strategies today: increasing lean protein intake, no skipping meals, meal planning and cooking strategies, avoiding temptations, and planning for success. We discussed various medication options to help [facility] with her weight loss efforts and we both agreed to continue current Wegovy dose.  Return in about 5 weeks (around 07/01/2023).Marland Kitchen She was informed of the importance of frequent  follow up visits to maximize her success with intensive lifestyle modifications for her multiple health conditions.  Attestation Statements:   Reviewed by clinician on day of visit: allergies, medications, problem list, medical history, surgical history, family history, social history, and previous encounter notes.    Reuben Likes, MD

## 2023-05-29 LAB — COMPREHENSIVE METABOLIC PANEL
ALT: 20 IU/L (ref 0–32)
AST: 20 IU/L (ref 0–40)
Albumin: 4.5 g/dL (ref 3.8–4.9)
Alkaline Phosphatase: 101 IU/L (ref 44–121)
BUN/Creatinine Ratio: 13 (ref 9–23)
BUN: 11 mg/dL (ref 6–24)
Bilirubin Total: 0.2 mg/dL (ref 0.0–1.2)
CO2: 23 mmol/L (ref 20–29)
Calcium: 9.8 mg/dL (ref 8.7–10.2)
Chloride: 104 mmol/L (ref 96–106)
Creatinine, Ser: 0.83 mg/dL (ref 0.57–1.00)
Globulin, Total: 3.2 g/dL (ref 1.5–4.5)
Glucose: 86 mg/dL (ref 70–99)
Potassium: 4.5 mmol/L (ref 3.5–5.2)
Sodium: 142 mmol/L (ref 134–144)
Total Protein: 7.7 g/dL (ref 6.0–8.5)
eGFR: 83 mL/min/{1.73_m2} (ref >59)

## 2023-05-29 LAB — LIPID PANEL WITH LDL/HDL RATIO
Cholesterol, Total: 204 mg/dL — ABNORMAL HIGH (ref 100–199)
HDL: 58 mg/dL (ref >39)
LDL Chol Calc (NIH): 132 mg/dL — ABNORMAL HIGH (ref 0–99)
LDL/HDL Ratio: 2.3 ratio (ref 0.0–3.2)
Triglycerides: 78 mg/dL (ref 0–149)
VLDL Cholesterol Cal: 14 mg/dL (ref 5–40)

## 2023-05-29 LAB — INSULIN, RANDOM: INSULIN: 10.1 u[IU]/mL (ref 2.6–24.9)

## 2023-05-29 LAB — VITAMIN D 25 HYDROXY (VIT D DEFICIENCY, FRACTURES): Vit D, 25-Hydroxy: 58 ng/mL (ref 30.0–100.0)

## 2023-05-29 LAB — HEMOGLOBIN A1C
Est. average glucose Bld gHb Est-mCnc: 114 mg/dL
Hgb A1c MFr Bld: 5.6 % (ref 4.8–5.6)

## 2023-06-03 DIAGNOSIS — E559 Vitamin D deficiency, unspecified: Secondary | ICD-10-CM | POA: Insufficient documentation

## 2023-06-03 DIAGNOSIS — E7849 Other hyperlipidemia: Secondary | ICD-10-CM | POA: Insufficient documentation

## 2023-06-03 NOTE — Assessment & Plan Note (Signed)
 Patient has been working on food intake control in terms of macronutrients.  She is also taking GLP analogue medication.  Needs repeat labs today- CMP, A1c and Insulin level.

## 2023-06-03 NOTE — Assessment & Plan Note (Signed)
 Needs repeat FLP today to.  Patient voices concern that her cholesterol will not have decreased as she is expecting.   Will discuss results at next appointment.

## 2023-06-03 NOTE — Assessment & Plan Note (Signed)
 Repeat Vitamin D level today.  Will discuss replacement at next appointment.

## 2023-06-03 NOTE — Assessment & Plan Note (Signed)
>>  ASSESSMENT AND PLAN FOR OTHER HYPERLIPIDEMIA WRITTEN ON 06/03/2023  6:29 AM BY Langston Reusing, MD  Needs repeat FLP today to.  Patient voices concern that her cholesterol will not have decreased as she is expecting.   Will discuss results at next appointment.

## 2023-06-10 ENCOUNTER — Encounter: Payer: Self-pay | Admitting: Nurse Practitioner

## 2023-06-10 ENCOUNTER — Ambulatory Visit (INDEPENDENT_AMBULATORY_CARE_PROVIDER_SITE_OTHER): Payer: 59 | Admitting: Nurse Practitioner

## 2023-06-10 VITALS — BP 114/80 | HR 80 | Temp 98.1°F | Ht 63.75 in | Wt 163.2 lb

## 2023-06-10 DIAGNOSIS — Z0000 Encounter for general adult medical examination without abnormal findings: Secondary | ICD-10-CM | POA: Diagnosis not present

## 2023-06-10 DIAGNOSIS — K760 Fatty (change of) liver, not elsewhere classified: Secondary | ICD-10-CM | POA: Diagnosis not present

## 2023-06-10 NOTE — Progress Notes (Signed)
 Complete physical exam  Patient: Sydney Alvarado   DOB: 05-Nov-1965   58 y.o. Female  MRN: [id number] Visit Date: 06/10/2023  Subjective:    Chief Complaint  Patient presents with   Annual Exam    No labs. No concerns.   Sydney Alvarado is a 58 y.o. female who presents today for a complete physical exam. She reports consuming a low fat diet.  Walking daily  She generally feels well. She reports sleeping well. She does not have additional problems to discuss today.  Vision:Yes Dental:Yes STD Screen:No  Mammogram by [facility] mammography. Report requested PAP smear completed by [facility], report requested Reviewed labs completed by [facility] provided 05/2023: CMP, Lipid panel, and hgbA1c  BP Readings from Last 3 Encounters:  06/10/23 114/80  05/27/23 108/69  04/16/23 113/76   Wt Readings from Last 3 Encounters:  06/10/23 163 lb 3.2 oz (74 kg)  05/27/23 160 lb (72.6 kg)  04/16/23 157 lb (71.2 kg)   Most recent fall risk assessment:    06/10/2023    1:12 PM  Fall Risk   Falls in the past year? 0  Number falls in past yr: 0  Injury with Fall? 0  Risk for fall due to : No Fall Risks   Depression screen:Yes - No Depression Most recent depression screenings:    06/10/2023    1:12 PM 04/10/2022    9:46 AM  PHQ 2/9 Scores  PHQ - 2 Score 0 0  PHQ- 9 Score  5   HPI  Hepatic steatosis Followed by GI at [facility]-Dr. Kappus last MRI ABDOMEN 2024 Normal CMP F/up in 2years  Past Medical History:  Diagnosis Date   Allergy    Anxiety    Aortic atherosclerosis (HCC) 04/16/2019   Arthritis    left knee, has had 5 surgery on left knee 2 ACL repairs   Cholelithiasis 04/16/2019   COVID    july 2022, tired, body aches, chills and fever. lasted10-14 days. 01/30/2021   Diverticulitis 10/2018   perforated   Gallstones    Hepatic steatosis    High cholesterol    Hyperhidrosis    Joint pain    Liver lesion    PONV (postoperative nausea and vomiting)    Pre-diabetes     Sigmoid diverticulosis    Wears glasses    Past Surgical History:  Procedure Laterality Date   ANTERIOR CRUCIATE LIGAMENT REPAIR     DILATION AND CURETTAGE OF UTERUS  2001   KNEE ARTHROSCOPY Left    KNEE SURGERY     multiple left knee surgery (x4)   LAPAROSCOPIC SIGMOID COLECTOMY     2020   MASS EXCISION Left 01/30/2021   Procedure: EXCISION MASS LEFT TRUNK;  Surgeon: Romie Levee, MD;  Location: [facility];  Service: General;  Laterality: Left;   MOUTH SURGERY  2016   PLANTAR FASCIECTOMY Bilateral    03/2018, 07/2018   Social History   Socioeconomic History   Marital status: Married    Spouse name: Not on file   Number of children: 2   Years of education: Not on file   Highest education level: Not on file  Occupational History   Occupation: system analyst  Tobacco Use   Smoking status: Never   Smokeless tobacco: Never  Vaping Use   Vaping status: Never Used  Substance and Sexual Activity   Alcohol use: No   Drug use: No   Sexual activity: Yes    Birth  control/protection: None  Other Topics Concern   Not on file  Social History Narrative   Marital status: married x 29 years.      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; loves job; 30  years      Seatbelt: 100%; no texting   Social Drivers of Corporate investment banker Strain: Not on file  Food Insecurity: Not on file  Transportation Needs: Not on file  Physical Activity: Not on file  Stress: Not on file  Social Connections: Not on file  Intimate Partner Violence: Not on file   Family Status  Relation Name Status   Mother  Alive   Father  Alive   Sister  Alive   Brother  Alive   MGM  Alive   MGF  Deceased   PGM  Deceased   PGF  Deceased   Daughter  Alive   Son  Alive   Neg Hx  (Not Specified)  No partnership data on file   Family History  Problem Relation  Age of Onset   Thyroid disease Mother    Hyperlipidemia Mother    Osteoporosis Mother 74   Diabetes Mother    Hypertension Mother    Obesity Mother    Heart disease Father    Hyperlipidemia Father    Hypertension Father    Atrial fibrillation Father    Skin cancer Father    Arthritis Brother    Hypertension Brother    Hypertension Maternal Grandmother    Osteoporosis Maternal Grandmother 8   Heart disease Maternal Grandfather        heart attack   Osteoporosis Paternal Grandmother 12   Hypertension Paternal Grandmother    Stroke Paternal Grandmother    Arthritis Paternal Grandfather    Liver cancer Paternal Grandfather    Cancer Daughter 20       melanoma   Colon cancer Neg Hx    Breast cancer Neg Hx    Cervical cancer Neg Hx    Ovarian cancer Neg Hx    Endometrial cancer Neg Hx    Allergies  Allergen Reactions   Sulfa Antibiotics Hives and Other (See Comments)    Patient Care Team: Adalena Abdulla, Bonna Gains, NP as PCP - General (Internal Medicine) Nadara Mustard, MD as Attending Physician (Orthopedic Surgery) Huel Cote, MD as Attending Physician (Obstetrics and Gynecology) Glyn Ade, PA-C as Physician Assistant (Dermatology)   Medications: Outpatient Medications Prior to Visit  Medication Sig   acetaminophen (TYLENOL) 650 MG CR tablet Take 650 mg by mouth every 8 (eight) hours as needed for pain.   Calcium-Magnesium-Vitamin D 600-300-400 LIQD Take by mouth.   cetirizine (ZYRTEC) 10 MG chewable tablet Chew 10 mg by mouth daily.   Cholecalciferol (VITAMIN D) 125 MCG (5000 UT) CAPS Take 5,000 Units by mouth daily.   Multiple Vitamin (MULTIVITAMIN) capsule Take 1 capsule by mouth daily.   Omega-3 1000 MG CAPS Take 1 capsule by mouth daily.   Probiotic Product (PROBIOTIC DAILY PO) Take 1 capsule by mouth daily. Take in morning-Align   psyllium (METAMUCIL) 58.6 % packet Take 1 packet by mouth daily.   Semaglutide-Weight Management 2.4 MG/0.75ML SOAJ Inject  2.4 mg into the skin once a week.   vitamin C (ASCORBIC ACID) 500 MG tablet Take 500 mg by mouth daily.   [DISCONTINUED] Coenzyme  Q10 (CO Q 10 PO) Take 1 capsule by mouth daily. (Patient not taking: Reported on 06/10/2023)   No facility-administered medications prior to visit.    Review of Systems  Constitutional:  Negative for activity change, appetite change and unexpected weight change.  Respiratory: Negative.    Cardiovascular: Negative.   Gastrointestinal: Negative.   Endocrine: Negative for cold intolerance and heat intolerance.  Genitourinary: Negative.   Musculoskeletal: Negative.   Skin: Negative.   Neurological: Negative.   Hematological: Negative.   Psychiatric/Behavioral:  Negative for behavioral problems, decreased concentration, dysphoric mood, hallucinations, self-injury, sleep disturbance and suicidal ideas. The patient is not nervous/anxious.    Last CBC Lab Results  Component Value Date   WBC 9.2 03/06/2022   HGB 13.9 03/06/2022   HCT 42.7 03/06/2022   MCV 89.9 03/06/2022   MCH 28.8 06/15/2019   RDW 14.0 03/06/2022   PLT 210.0 03/06/2022   Last metabolic panel Lab Results  Component Value Date   GLUCOSE 86 05/27/2023   NA 142 05/27/2023   K 4.5 05/27/2023   CL 104 05/27/2023   CO2 23 05/27/2023   BUN 11 05/27/2023   CREATININE 0.83 05/27/2023   EGFR 83 05/27/2023   CALCIUM 9.8 05/27/2023   PROT 7.7 05/27/2023   ALBUMIN 4.5 05/27/2023   LABGLOB 3.2 05/27/2023   AGRATIO 1.5 06/15/2019   BILITOT 0.2 05/27/2023   ALKPHOS 101 05/27/2023   AST 20 05/27/2023   ALT 20 05/27/2023   ANIONGAP 8 02/27/2019   Last lipids Lab Results  Component Value Date   CHOL 204 (H) 05/27/2023   HDL 58 05/27/2023   LDLCALC 132 (H) 05/27/2023   TRIG 78 05/27/2023   CHOLHDL 4 04/08/2021   Last hemoglobin A1c Lab Results  Component Value Date   HGBA1C 5.6 05/27/2023   Last thyroid functions Lab Results  Component Value Date   TSH 2.120 06/10/2022   T3TOTAL  126 06/10/2022   Last vitamin D Lab Results  Component Value Date   VD25OH 58.0 05/27/2023      Objective:  BP 114/80 (BP Location: Right Arm, Patient Position: Sitting)   Pulse 80   Temp 98.1 F (36.7 C) (Temporal)   Ht 5' 3.75" (1.619 m)   Wt 163 lb 3.2 oz (74 kg)   SpO2 94%   BMI 28.23 kg/m     Physical Exam Vitals and nursing note reviewed.  Constitutional:      General: She is not in acute distress. HENT:     Right Ear: Tympanic membrane, ear canal and external ear normal.     Left Ear: Tympanic membrane, ear canal and external ear normal.     Nose: Nose normal.  Eyes:     Extraocular Movements: Extraocular movements intact.     Conjunctiva/sclera: Conjunctivae normal.     Pupils: Pupils are equal, round, and reactive to light.  Neck:     Thyroid: No thyroid mass, thyromegaly or thyroid tenderness.  Cardiovascular:     Rate and Rhythm: Normal rate and regular rhythm.     Pulses: Normal pulses.     Heart sounds: Normal heart sounds.  Pulmonary:     Effort: Pulmonary effort is normal.     Breath sounds: Normal breath sounds.  Abdominal:     General: Bowel sounds are normal.     Palpations: Abdomen is soft.  Musculoskeletal:        General: Normal range of motion.     Cervical back: Normal range of motion and  neck supple.     Right lower leg: No edema.     Left lower leg: No edema.  Lymphadenopathy:     Cervical: No cervical adenopathy.  Skin:    General: Skin is warm and dry.  Neurological:     Mental Status: She is alert and oriented to person, place, and time.     Cranial Nerves: No cranial nerve deficit.  Psychiatric:        Mood and Affect: Mood normal.        Behavior: Behavior normal.        Thought Content: Thought content normal.      No results found for any visits on 06/10/23.    Assessment & Plan:    Routine Health Maintenance and Physical Exam  Immunization History  Administered Date(s) Administered   Influenza, Seasonal, Injecte,  Preservative Fre 03/26/2012   Influenza,inj,Quad PF,6+ Mos 02/01/2015   Moderna Sars-Covid-2 Vaccination 03/14/2020   PFIZER(Purple Top)SARS-COV-2 Vaccination 06/23/2019, 07/20/2019   Tdap 06/12/2015   Zoster Recombinant(Shingrix) 04/08/2021    Health Maintenance  Topic Date Due   Cervical Cancer Screening (HPV/Pap Cotest)  Never done   INFLUENZA VACCINE  06/15/2023 (Originally 10/16/2022)   COVID-19 Vaccine (4 - 2024-25 season) 06/26/2023 (Originally 11/16/2022)   Zoster Vaccines- Shingrix (2 of 2) 09/10/2023 (Originally 06/03/2021)   Hepatitis C Screening  06/09/2024 (Originally 07/11/1983)   MAMMOGRAM  04/28/2024   DTaP/Tdap/Td (2 - Td or Tdap) 06/11/2025   Colonoscopy  11/13/2027   HIV Screening  Completed   HPV VACCINES  Aged Out   Discussed health benefits of physical activity, and encouraged her to engage in regular exercise appropriate for her age and condition.  Problem List Items Addressed This Visit     Hepatic steatosis   Followed by GI at [facility]-Dr. Kappus last MRI ABDOMEN 2024 Normal CMP F/up in 2years      Other Visit Diagnoses       Preventative health care    -  Primary      Return in about 1 year (around 06/09/2024) for CPE (fasting).     Alysia Penna, NP

## 2023-06-10 NOTE — Patient Instructions (Signed)
 Preventive Care 16-55 Years Old, Female  Preventive care refers to lifestyle choices and visits with your health care provider that can promote health and wellness. Preventive care visits are also called wellness exams.  What can I expect for my preventive care visit?  Counseling  Your health care provider may ask you questions about your:  Medical history, including:  Past medical problems.  Family medical history.  Pregnancy history.  Current health, including:  Menstrual cycle.  Method of birth control.  Emotional well-being.  Home life and relationship well-being.  Sexual activity and sexual health.  Lifestyle, including:  Alcohol, nicotine or tobacco, and drug use.  Access to firearms.  Diet, exercise, and sleep habits.  Work and work Astronomer.  Sunscreen use.  Safety issues such as seatbelt and bike helmet use.  Physical exam  Your health care provider will check your:  Height and weight. These may be used to calculate your BMI (body mass index). BMI is a measurement that tells if you are at a healthy weight.  Waist circumference. This measures the distance around your waistline. This measurement also tells if you are at a healthy weight and may help predict your risk of certain diseases, such as type 2 diabetes and high blood pressure.  Heart rate and blood pressure.  Body temperature.  Skin for abnormal spots.  What immunizations do I need?    Vaccines are usually given at various ages, according to a schedule. Your health care provider will recommend vaccines for you based on your age, medical history, and lifestyle or other factors, such as travel or where you work.  What tests do I need?  Screening  Your health care provider may recommend screening tests for certain conditions. This may include:  Lipid and cholesterol levels.  Diabetes screening. This is done by checking your blood sugar (glucose) after you have not eaten for a while (fasting).  Pelvic exam and Pap test.  Hepatitis B test.  Hepatitis C  test.  HIV (human immunodeficiency virus) test.  STI (sexually transmitted infection) testing, if you are at risk.  Lung cancer screening.  Colorectal cancer screening.  Mammogram. Talk with your health care provider about when you should start having regular mammograms. This may depend on whether you have a family history of breast cancer.  BRCA-related cancer screening. This may be done if you have a family history of breast, ovarian, tubal, or peritoneal cancers.  Bone density scan. This is done to screen for osteoporosis.  Talk with your health care provider about your test results, treatment options, and if necessary, the need for more tests.  Follow these instructions at home:  Eating and drinking    Eat a diet that includes fresh fruits and vegetables, whole grains, lean protein, and low-fat dairy products.  Take vitamin and mineral supplements as recommended by your health care provider.  Do not drink alcohol if:  Your health care provider tells you not to drink.  You are pregnant, may be pregnant, or are planning to become pregnant.  If you drink alcohol:  Limit how much you have to 0-1 drink a day.  Know how much alcohol is in your drink. In the [place] one drink equals one 12 oz bottle of beer (355 mL), one 5 oz glass of wine (148 mL), or one 1 oz glass of hard liquor (44 mL).  Lifestyle  Brush your teeth every morning and night with fluoride toothpaste. Floss one time each day.  Exercise for at least  30 minutes 5 or more days each week.  Do not use any products that contain nicotine or tobacco. These products include cigarettes, chewing tobacco, and vaping devices, such as e-cigarettes. If you need help quitting, ask your health care provider.  Do not use drugs.  If you are sexually active, practice safe sex. Use a condom or other form of protection to prevent STIs.  If you do not wish to become pregnant, use a form of birth control. If you plan to become pregnant, see your health care provider for a  prepregnancy visit.  Take aspirin only as told by your health care provider. Make sure that you understand how much to take and what form to take. Work with your health care provider to find out whether it is safe and beneficial for you to take aspirin daily.  Find healthy ways to manage stress, such as:  Meditation, yoga, or listening to music.  Journaling.  Talking to a trusted person.  Spending time with friends and family.  Minimize exposure to UV radiation to reduce your risk of skin cancer.  Safety  Always wear your seat belt while driving or riding in a vehicle.  Do not drive:  If you have been drinking alcohol. Do not ride with someone who has been drinking.  When you are tired or distracted.  While texting.  If you have been using any mind-altering substances or drugs.  Wear a helmet and other protective equipment during sports activities.  If you have firearms in your house, make sure you follow all gun safety procedures.  Seek help if you have been physically or sexually abused.  What's next?  Visit your health care provider once a year for an annual wellness visit.  Ask your health care provider how often you should have your eyes and teeth checked.  Stay up to date on all vaccines.  This information is not intended to replace advice given to you by your health care provider. Make sure you discuss any questions you have with your health care provider.  Document Revised: 08/29/2020 Document Reviewed: 08/29/2020  Elsevier Patient Education  2024 [employer].

## 2023-06-10 NOTE — Assessment & Plan Note (Signed)
 Followed by GI at [facility]-Dr. Kappus last MRI ABDOMEN 2024 Normal CMP F/up in 2years

## 2023-07-08 ENCOUNTER — Encounter (INDEPENDENT_AMBULATORY_CARE_PROVIDER_SITE_OTHER): Payer: Self-pay | Admitting: Family Medicine

## 2023-07-08 ENCOUNTER — Ambulatory Visit (INDEPENDENT_AMBULATORY_CARE_PROVIDER_SITE_OTHER): Admitting: Family Medicine

## 2023-07-08 VITALS — BP 111/69 | HR 67 | Temp 98.1°F | Ht 64.0 in | Wt 156.0 lb

## 2023-07-08 DIAGNOSIS — E7800 Pure hypercholesterolemia, unspecified: Secondary | ICD-10-CM

## 2023-07-08 DIAGNOSIS — E66811 Obesity, class 1: Secondary | ICD-10-CM | POA: Diagnosis not present

## 2023-07-08 DIAGNOSIS — Z6826 Body mass index (BMI) 26.0-26.9, adult: Secondary | ICD-10-CM

## 2023-07-08 DIAGNOSIS — Z6834 Body mass index (BMI) 34.0-34.9, adult: Secondary | ICD-10-CM

## 2023-07-08 DIAGNOSIS — R61 Generalized hyperhidrosis: Secondary | ICD-10-CM | POA: Diagnosis not present

## 2023-07-08 MED ORDER — DRYSOL 20 % EX SOLN: Freq: Every day | CUTANEOUS | 2 refills | Status: DC

## 2023-07-08 MED ORDER — SEMAGLUTIDE-WEIGHT MANAGEMENT 2.4 MG/0.75ML ~~LOC~~ SOAJ: 2.4000 mg | SUBCUTANEOUS | 1 refills | Status: DC

## 2023-07-08 NOTE — Assessment & Plan Note (Signed)
 The 10-year ASCVD risk score (Arnett DK, et al., 2019) is: 1.5%   Values used to calculate the score:     Age: 58 years     Sex: Female     Is Non-Hispanic African American: No     Diabetic: No     Tobacco smoker: No     Systolic Blood Pressure: 111 mmHg     Is BP treated: No     HDL Cholesterol: 58 mg/dL     Total Cholesterol: 204 mg/dL  Patient is not on medication.  Risk is not high enough to warrant medication.  Continue to work on nutrition monitoring and ensuring not too high of saturated fat intake.

## 2023-07-08 NOTE — Progress Notes (Signed)
 SUBJECTIVE:  Chief Complaint: Obesity  Interim History: Over the last month she mentions she did not do as much as she planned to do.  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  She is feeling better now and is starting exercising again. She wanted to discuss that she is significantly hungry in the am after breakfast.  She is getting 6oz of meat at dinner.  She did get labs done and needs to discuss them today.  Sydney Alvarado is here to discuss her progress with her obesity treatment plan. She is on the Category 2 Plan and states she is following her eating plan approximately 50                                                                                                                                                                                                    % of the time. She states she is exercising 30-45 minutes 3-4 times per week.   OBJECTIVE: Visit Diagnoses: Problem List Items Addressed This Visit       Musculoskeletal and Integument   Hyperhidrosis   Managed well on drysol by derm but derm went out of business.  With change in weather and increased physical activity needs refill to be able to manage.  Rx sent in.      Relevant Medications   aluminum chloride (DRYSOL) 20 % external solution     Other   Pure hypercholesterolemia - Primary   The 10-year ASCVD risk score (Arnett DK, et al., 2019) is: 1.5%   Values used to calculate the score:     Age: 58 years     Sex: Female     Is Non-Hispanic African American: No     Diabetic: No     Tobacco smoker: No     Systolic Blood Pressure: 111 mmHg     Is BP treated: No     HDL Cholesterol: 58 mg/dL     Total Cholesterol: 204 mg/dL  Patient is not on medication.  Risk is not high enough to warrant medication.  Continue to work on nutrition monitoring and ensuring not too high of saturated fat intake.      Class 1 obesity with serious comorbidity and body mass index (BMI) of 34.0 to 34.9 in adult    Anthropometric Measurements Height: 5\' 4"  (1.626 m) Weight: 156 lb (70.8 kg) BMI (Calculated): 26.76 Weight at Last Visit: 160 lb Weight Lost Since Last Visit: 4 Weight Gained Since Last Visit: 0 Starting Weight: 198 lb Total Weight Loss (lbs): 42 lb (19.1 kg) Body Composition  Body Fat %: 31.9 % Fat Mass (lbs): 49.8 lbs Muscle Mass (lbs): 101.2 lbs Total Body Water (lbs): 71.2 lbs Visceral Fat Rating : 7 Other Clinical Data Today's Visit #: 15 Starting Date: 06/10/22 Comments: Cat 2       Relevant Medications   Semaglutide -Weight Management 2.4 MG/0.75ML SOAJ    No data recorded       07/08/2023    9:00 AM 06/10/2023    1:14 PM 05/27/2023    9:00 AM  Vitals with BMI  Height 5\' 4"  5' 3.75" 5\' 4"   Weight 156 lbs 163 lbs 3 oz 160 lbs  BMI 26.76 28.24 27.45  Systolic 111 114 119  Diastolic 69 80 69  Pulse 67 80 59      ASSESSMENT AND PLAN:  Diet: Keiyanna is currently in the action stage of change. As such, her goal is to continue with weight loss efforts and has agreed to the Category 2 Plan.   Exercise:  For substantial health benefits, adults should do at least 150 minutes (2 hours and 30 minutes) a week of moderate-intensity, or 75 minutes (1 hour and 15 minutes) a week of vigorous-intensity aerobic physical activity, or an equivalent combination of moderate- and vigorous-intensity aerobic activity. Aerobic activity should be performed in episodes of at least 10 minutes, and preferably, it should be spread throughout the week.  Behavior Modification:  We discussed the following Behavioral Modification Strategies today: increasing lean protein intake, decreasing simple carbohydrates, meal planning and cooking strategies, keeping healthy foods in the home, avoiding temptations, and planning for success. We discussed various medication options to help [facility] with her weight loss efforts and we both agreed to continue Wegovy  at current dose.  Return in about 6 weeks  (around 08/19/2023).Aaron Aas She was informed of the importance of frequent follow up visits to maximize her success with intensive lifestyle modifications for her multiple health conditions.  Attestation Statements:   Reviewed by clinician on day of visit: allergies, medications, problem list, medical history, surgical history, family history, social history, and previous encounter notes.     Donaciano Frizzle, MD

## 2023-07-13 NOTE — Assessment & Plan Note (Signed)
 Anthropometric Measurements Height: 5\' 4"  (1.626 m) Weight: 156 lb (70.8 kg) BMI (Calculated): 26.76 Weight at Last Visit: 160 lb Weight Lost Since Last Visit: 4 Weight Gained Since Last Visit: 0 Starting Weight: 198 lb Total Weight Loss (lbs): 42 lb (19.1 kg) Body Composition  Body Fat %: 31.9 % Fat Mass (lbs): 49.8 lbs Muscle Mass (lbs): 101.2 lbs Total Body Water (lbs): 71.2 lbs Visceral Fat Rating : 7 Other Clinical Data Today's Visit #: 15 Starting Date: 06/10/22 Comments: Cat 2

## 2023-07-13 NOTE — Assessment & Plan Note (Signed)
 Managed well on drysol by derm but derm went out of business.  With change in weather and increased physical activity needs refill to be able to manage.  Rx sent in.

## 2023-08-13 ENCOUNTER — Encounter (INDEPENDENT_AMBULATORY_CARE_PROVIDER_SITE_OTHER): Payer: Self-pay | Admitting: Family Medicine

## 2023-08-13 ENCOUNTER — Ambulatory Visit (INDEPENDENT_AMBULATORY_CARE_PROVIDER_SITE_OTHER): Admitting: Family Medicine

## 2023-08-13 VITALS — BP 128/75 | HR 57 | Temp 98.1°F | Ht 64.0 in | Wt 158.0 lb

## 2023-08-13 DIAGNOSIS — Z6834 Body mass index (BMI) 34.0-34.9, adult: Secondary | ICD-10-CM

## 2023-08-13 DIAGNOSIS — E7849 Other hyperlipidemia: Secondary | ICD-10-CM

## 2023-08-13 DIAGNOSIS — E669 Obesity, unspecified: Secondary | ICD-10-CM

## 2023-08-13 DIAGNOSIS — Z6827 Body mass index (BMI) 27.0-27.9, adult: Secondary | ICD-10-CM | POA: Diagnosis not present

## 2023-08-13 MED ORDER — SEMAGLUTIDE-WEIGHT MANAGEMENT 2.4 MG/0.75ML ~~LOC~~ SOAJ: 2.4000 mg | SUBCUTANEOUS | 1 refills | Status: DC

## 2023-08-13 NOTE — Progress Notes (Signed)
   SUBJECTIVE:  Chief Complaint: Obesity  Interim History: Patient has had a few situations that have worsened her stress.  Work and family have both been stressful.  She is struggling with giving herself grace for less compliance to meal plan.  She is worried about getting back to her previous compliance.  She isn't sleeping well either.    Sydney Alvarado is here to discuss her progress with her obesity treatment plan. She is on the Category 2 Plan and states she is following her eating plan approximately 25 % of the time. She states she is walking when able.   OBJECTIVE: Visit Diagnoses: Problem List Items Addressed This Visit       Other   Class 1 obesity with serious comorbidity and body mass index (BMI) of 34.0 to 34.9 in adult   Relevant Medications   Semaglutide -Weight Management 2.4 MG/0.75ML SOAJ   Other Visit Diagnoses       Other hyperlipidemia    -  Primary     BMI 27.0-27.9,adult           Vitals Temp: 98.1 F (36.7 C) BP: 128/75 Pulse Rate: (!) 57 SpO2: 100 %   Anthropometric Measurements Height: 5' 4 (1.626 m) Weight: 158 lb (71.7 kg) BMI (Calculated): 27.11 Weight at Last Visit: 156 lb Weight Lost Since Last Visit: 0 Weight Gained Since Last Visit: 2 Starting Weight: 198 lb Total Weight Loss (lbs): 40 lb (18.1 kg)   Body Composition  Body Fat %: 33.3 % Fat Mass (lbs): 52.8 lbs Muscle Mass (lbs): 100.2 lbs Total Body Water (lbs): 73.2 lbs Visceral Fat Rating : 8   Other Clinical Data Today's Visit #: 16 Starting Date: 06/10/22 Comments: Cat 3     ASSESSMENT AND PLAN: Assessment & Plan Other hyperlipidemia Patient has worked on lifestyle modifications to limit simple carbohydrates and saturated fat intake.  She is focusing on protein intake daily.  Will need a repeat FLP in the next 2 months. Obesity with starting BMI of 34.0  BMI 27.0-27.9,adult    Diet: Sydney Alvarado is currently in the action stage of change. As such, her goal is to continue  with weight loss efforts and has agreed to the Category 2 Plan.   Exercise:  For substantial health benefits, adults should do at least 150 minutes (2 hours and 30 minutes) a week of moderate-intensity, or 75 minutes (1 hour and 15 minutes) a week of vigorous-intensity aerobic physical activity, or an equivalent combination of moderate- and vigorous-intensity aerobic activity. Aerobic activity should be performed in episodes of at least 10 minutes, and preferably, it should be spread throughout the week.  Behavior Modification:  We discussed the following Behavioral Modification Strategies today: increasing lean protein intake, decreasing simple carbohydrates, increasing vegetables, meal planning and cooking strategies, and keeping healthy foods in the home. We discussed various medication options to help Sydney Alvarado with her weight loss efforts and we both agreed to continue Wegovy  at current dose.  Return in about 4 weeks (around 09/10/2023).   She was informed of the importance of frequent follow up visits to maximize her success with intensive lifestyle modifications for her multiple health conditions.  Attestation Statements:   Reviewed by clinician on day of visit: allergies, medications, problem list, medical history, surgical history, family history, social history, and previous encounter notes.     Sydney Cho, MD

## 2023-08-19 ENCOUNTER — Ambulatory Visit (INDEPENDENT_AMBULATORY_CARE_PROVIDER_SITE_OTHER): Admitting: Family Medicine

## 2023-09-17 ENCOUNTER — Encounter (INDEPENDENT_AMBULATORY_CARE_PROVIDER_SITE_OTHER): Payer: Self-pay | Admitting: Family Medicine

## 2023-09-17 ENCOUNTER — Ambulatory Visit (INDEPENDENT_AMBULATORY_CARE_PROVIDER_SITE_OTHER): Admitting: Family Medicine

## 2023-09-17 VITALS — BP 118/76 | HR 60 | Temp 97.9°F | Ht 64.0 in | Wt 155.0 lb

## 2023-09-17 DIAGNOSIS — R7303 Prediabetes: Secondary | ICD-10-CM

## 2023-09-17 DIAGNOSIS — E66811 Obesity, class 1: Secondary | ICD-10-CM

## 2023-09-17 DIAGNOSIS — Z6826 Body mass index (BMI) 26.0-26.9, adult: Secondary | ICD-10-CM | POA: Diagnosis not present

## 2023-09-17 DIAGNOSIS — E7849 Other hyperlipidemia: Secondary | ICD-10-CM | POA: Diagnosis not present

## 2023-09-17 DIAGNOSIS — E669 Obesity, unspecified: Secondary | ICD-10-CM | POA: Diagnosis not present

## 2023-09-17 MED ORDER — SEMAGLUTIDE-WEIGHT MANAGEMENT 2.4 MG/0.75ML ~~LOC~~ SOAJ: 2.4000 mg | SUBCUTANEOUS | 1 refills | Status: DC

## 2023-09-17 NOTE — Progress Notes (Signed)
 SUBJECTIVE:  Chief Complaint: Obesity  Interim History: patient had a difficult first few weeks between our last appointments.  She has fallen back into the habit of eating only when she gets very hungry.  She has learned that she is starting to get hungrier faster.  She is getting back to being mindful of the food intake and snacking she is doing.  She isn't sure if its the meal plan or her that is the cause of the increased hunger.  She thinks she is doing a little bit more but not much more when it comes to intake. Protein shake in the am or 2 eggs and slice of bread or yogurt.  Snack is string cheese.  Lunch is a premade meal with 20 grams of protein or [place] cheese, lettuce, tomato or cottage cheese, fruit and yogurt.  Snack will be yogurt, cheese stick.  She will occasionally do fruit like an apple.  Dinner is a protein meal or [place] burritos with vegetables (she is getting in 4oz of protein).  After dinner she is snacking on something like watermelon. She has an upcoming vacation on the first week of August.  Sydney Alvarado is here to discuss her progress with her obesity treatment plan. She is on the Category 2 Plan and states she is following her eating plan approximately 40 % of the time. She states she is working around [employer] on projects.   OBJECTIVE: Visit Diagnoses: Problem List Items Addressed This Visit       Other   Class 1 obesity with serious comorbidity and body mass index (BMI) of 34.0 to 34.9 in adult   Relevant Medications   Semaglutide -Weight Management 2.4 MG/0.75ML SOAJ   Prediabetes   Other Visit Diagnoses       Other hyperlipidemia    -  Primary     BMI 26.0-26.9,adult           Vitals Temp: 97.9 F (36.6 C) BP: 118/76 Pulse Rate: 60 SpO2: 99 %   Anthropometric Measurements Height: 5' 4 (1.626 m) Weight: 155 lb (70.3 kg) BMI (Calculated): 26.59 Weight at Last Visit: 158 lb Weight Lost Since Last Visit: 3 Weight Gained Since Last Visit:  0 Starting Weight: 198 lb Total Weight Loss (lbs): 43 lb (19.5 kg)   Body Composition  Body Fat %: 31.4 % Fat Mass (lbs): 48.8 lbs Muscle Mass (lbs): 101 lbs Total Body Water (lbs): 73 lbs Visceral Fat Rating : 7   Other Clinical Data Today's Visit #: 17 Starting Date: 06/10/22 Comments: Cat 2     ASSESSMENT AND PLAN: Assessment & Plan Other hyperlipidemia Patient has been doing well limiting her saturated fat intake.  Last LDL 132 but patient is up for labs in the next month. Prediabetes Patient is on semaglutide  with good control of simple carbs. Continue current medication and meal plan. Obesity with starting BMI of 34.0  BMI 26.0-26.9,adult    Diet: Sydney Alvarado is currently in the action stage of change. As such, her goal is to continue with weight loss efforts and has agreed to the Category 2 Plan.   Exercise:  For substantial health benefits, adults should do at least 150 minutes (2 hours and 30 minutes) a week of moderate-intensity, or 75 minutes (1 hour and 15 minutes) a week of vigorous-intensity aerobic physical activity, or an equivalent combination of moderate- and vigorous-intensity aerobic activity. Aerobic activity should be performed in episodes of at least 10 minutes, and preferably, it should be spread throughout the  week.  Behavior Modification:  We discussed the following Behavioral Modification Strategies today: increasing lean protein intake, decreasing simple carbohydrates, increasing vegetables, meal planning and cooking strategies, and keeping healthy foods in the home. We discussed various medication options to help [facility] with her weight loss efforts and we both agreed to continue Wegovy  at current dose of 2.4mg  with a refill.  Return in about 6 weeks (around 10/29/2023).   She was informed of the importance of frequent follow up visits to maximize her success with intensive lifestyle modifications for her multiple health conditions.  Attestation  Statements:   Reviewed by clinician on day of visit: allergies, medications, problem list, medical history, surgical history, family history, social history, and previous encounter notes.    Sydney Cho, MD

## 2023-09-27 NOTE — Assessment & Plan Note (Signed)
 Patient is on semaglutide  with good control of simple carbs. Continue current medication and meal plan.

## 2023-10-29 ENCOUNTER — Ambulatory Visit (INDEPENDENT_AMBULATORY_CARE_PROVIDER_SITE_OTHER): Admitting: Family Medicine

## 2023-10-29 ENCOUNTER — Encounter (INDEPENDENT_AMBULATORY_CARE_PROVIDER_SITE_OTHER): Payer: Self-pay | Admitting: Family Medicine

## 2023-10-29 VITALS — BP 117/67 | HR 65 | Temp 98.1°F | Ht 64.0 in | Wt 152.0 lb

## 2023-10-29 DIAGNOSIS — Z6826 Body mass index (BMI) 26.0-26.9, adult: Secondary | ICD-10-CM | POA: Diagnosis not present

## 2023-10-29 DIAGNOSIS — R7303 Prediabetes: Secondary | ICD-10-CM | POA: Diagnosis not present

## 2023-10-29 DIAGNOSIS — E669 Obesity, unspecified: Secondary | ICD-10-CM | POA: Diagnosis not present

## 2023-10-29 DIAGNOSIS — E7849 Other hyperlipidemia: Secondary | ICD-10-CM | POA: Diagnosis not present

## 2023-10-29 DIAGNOSIS — Z6834 Body mass index (BMI) 34.0-34.9, adult: Secondary | ICD-10-CM

## 2023-10-29 MED ORDER — SEMAGLUTIDE-WEIGHT MANAGEMENT 2.4 MG/0.75ML ~~LOC~~ SOAJ
2.4000 mg | SUBCUTANEOUS | 1 refills | Status: DC
Start: 2023-10-29 — End: 2023-12-17

## 2023-10-29 NOTE — Progress Notes (Signed)
 SUBJECTIVE:  Chief Complaint: Obesity  Interim History: Patient has had a difficult few weeks- life has picked up in terms of stress with work and family.  Daughter has melanoma and has a mass in her armpit, her son had a car accident, her husband hasn't worked in a couple of weeks. Has been slightly more lax on her meal plan than she was previously. When she is on the plan consistently she feels good in satiety and cravings.    Sydney Alvarado is here to discuss her progress with her obesity treatment plan. She is on the Category 2 Plan and states she is following her eating plan approximately 40 % of the time. She states she is not exercising as much, but is active around the house.   OBJECTIVE: Visit Diagnoses: Problem List Items Addressed This Visit       Other   Class 1 obesity with serious comorbidity and body mass index (BMI) of 34.0 to 34.9 in adult   Relevant Medications   semaglutide -weight management (WEGOVY ) 2.4 MG/0.75ML SOAJ SQ injection   Prediabetes   Other Visit Diagnoses       Other hyperlipidemia    -  Primary     BMI 26.0-26.9,adult           Vitals Temp: 98.1 F (36.7 C) BP: 117/67 Pulse Rate: 65 SpO2: 99 %   Anthropometric Measurements Height: 5' 4 (1.626 m) Weight: 152 lb (68.9 kg) BMI (Calculated): 26.08 Weight at Last Visit: 155 lb Weight Lost Since Last Visit: 3 Weight Gained Since Last Visit: 0 Starting Weight: 198 lb Total Weight Loss (lbs): 46 lb (20.9 kg)   Body Composition  Body Fat %: 30.3 % Fat Mass (lbs): 46.2 lbs Muscle Mass (lbs): 100.8 lbs Total Body Water (lbs): 70.8 lbs Visceral Fat Rating : 7   Other Clinical Data Fasting: no Labs: no Today's Visit #: 18 Starting Date: 06/10/22 Comments: Cat 2     ASSESSMENT AND PLAN: Assessment & Plan Other hyperlipidemia Last lipid panel done in March showing an LDL of 132 which had gone up from previous lab.  HDL steady and at goal as well as triglycerides.  Patient has been  working on being more mindful of saturated fat intake.  Will need repeat labs at next appointment. Prediabetes A1c of 5.6 on last lab check in March.  Insulin  level close to goal.  This is improved from where patient started that previously with an A1c in the prediabetic range.  She has been working on dietary/lifestyle modifications and has been stable on GLP-1 medications.  Will continue current treatment plan. BMI 26.0-26.9,adult  Obesity with starting BMI of 34.0    Diet: Sydney Alvarado is currently in the action stage of change. As such, her goal is to continue with weight loss efforts and has agreed to the Category 2 Plan and keeping a food journal and adhering to recommended goals of 1200-1300 calories and 90 or more grams of protein.   Exercise:  For substantial health benefits, adults should do at least 150 minutes (2 hours and 30 minutes) a week of moderate-intensity, or 75 minutes (1 hour and 15 minutes) a week of vigorous-intensity aerobic physical activity, or an equivalent combination of moderate- and vigorous-intensity aerobic activity. Aerobic activity should be performed in episodes of at least 10 minutes, and preferably, it should be spread throughout the week.  Behavior Modification:  We discussed the following Behavioral Modification Strategies today: increasing lean protein intake, decreasing simple carbohydrates, increasing vegetables, and  meal planning and cooking strategies. We discussed various medication options to help [facility] with her weight loss efforts and we both agreed to continue wegovy  at current dose.  Return in about 6 weeks (around 12/10/2023) for fasting labs.   She was informed of the importance of frequent follow up visits to maximize her success with intensive lifestyle modifications for her multiple health conditions.  Attestation Statements:   Reviewed by clinician on day of visit: allergies, medications, problem list, medical history, surgical history, family  history, social history, and previous encounter notes.     Adelita Cho, MD

## 2023-11-09 NOTE — Assessment & Plan Note (Signed)
 A1c of 5.6 on last lab check in March.  Insulin  level close to goal.  This is improved from where patient started that previously with an A1c in the prediabetic range.  She has been working on dietary/lifestyle modifications and has been stable on GLP-1 medications.  Will continue current treatment plan.

## 2023-12-17 ENCOUNTER — Encounter (INDEPENDENT_AMBULATORY_CARE_PROVIDER_SITE_OTHER): Payer: Self-pay | Admitting: Family Medicine

## 2023-12-17 ENCOUNTER — Ambulatory Visit (INDEPENDENT_AMBULATORY_CARE_PROVIDER_SITE_OTHER): Admitting: Family Medicine

## 2023-12-17 VITALS — BP 120/79 | HR 62 | Temp 97.8°F | Ht 64.0 in | Wt 150.0 lb

## 2023-12-17 DIAGNOSIS — E669 Obesity, unspecified: Secondary | ICD-10-CM | POA: Diagnosis not present

## 2023-12-17 DIAGNOSIS — R61 Generalized hyperhidrosis: Secondary | ICD-10-CM | POA: Diagnosis not present

## 2023-12-17 DIAGNOSIS — E7849 Other hyperlipidemia: Secondary | ICD-10-CM | POA: Diagnosis not present

## 2023-12-17 DIAGNOSIS — E66811 Obesity, class 1: Secondary | ICD-10-CM

## 2023-12-17 DIAGNOSIS — R7303 Prediabetes: Secondary | ICD-10-CM | POA: Diagnosis not present

## 2023-12-17 DIAGNOSIS — Z6825 Body mass index (BMI) 25.0-25.9, adult: Secondary | ICD-10-CM

## 2023-12-17 MED ORDER — DRYSOL 20 % EX SOLN: Freq: Every day | CUTANEOUS | 2 refills | Status: AC

## 2023-12-17 MED ORDER — SEMAGLUTIDE-WEIGHT MANAGEMENT 2.4 MG/0.75ML ~~LOC~~ SOAJ: 2.4000 mg | SUBCUTANEOUS | 0 refills | Status: DC

## 2023-12-17 NOTE — Assessment & Plan Note (Signed)
 Symptoms well-managed with Drysol.  Needs a refill of that medication today.  No side effects mentioned.  Will refill for 3 months.

## 2023-12-17 NOTE — Assessment & Plan Note (Signed)
 Patient reports slightly increased indulgent eating over the last handful of weeks due to time constraints as well as stress.  She is anticipating her labs today to show slightly worsening levels.  Will repeat CMP, A1c and insulin  level today.

## 2023-12-17 NOTE — Progress Notes (Signed)
 SUBJECTIVE:  Chief Complaint: Obesity  Interim History: Patient has been very busy with house stuff- catching up on projects and activities she has been putting off.  Over the last 6 months she has slowly allowed others to encroach on her time and is back to making herself a priority.  Mentions this was unintentional and just realized that a majority of her time is now being taken with other demands.  She had an URI last week.  She is trying to get back to her routine and her demands and making herself a priority.  Wants to recommit to her meal plan and is being intentional with food intake. Patient has a trip to [place]  next week but is anticipating an increase in activity.  Sydney Alvarado is here to discuss her progress with her obesity treatment plan. She is on the Category 2 Plan and states she is following her eating plan approximately 40 % of the time. She states she is not exercising as much.  Has been doing more around her house recently.    OBJECTIVE: Visit Diagnoses: Problem List Items Addressed This Visit       Other   Class 1 obesity with serious comorbidity and body mass index (BMI) of 34.0 to 34.9 in adult   Prediabetes - Primary   Relevant Orders   Comprehensive metabolic panel with GFR   Hemoglobin A1c   Insulin , random   Other Visit Diagnoses       Other hyperlipidemia       Relevant Orders   Lipid Panel With LDL/HDL Ratio     BMI 25.0-25.9,adult           Vitals Temp: 97.8 F (36.6 C) BP: 120/79 Pulse Rate: 62 SpO2: 100 %   Anthropometric Measurements Height: 5' 4 (1.626 m) Weight: 150 lb (68 kg) BMI (Calculated): 25.73 Weight at Last Visit: 152 lb Weight Lost Since Last Visit: 2 Weight Gained Since Last Visit: 0 Starting Weight: 198 lb Total Weight Loss (lbs): 48 lb (21.8 kg)   Body Composition  Body Fat %: 30.9 % Fat Mass (lbs): 46.6 lbs Muscle Mass (lbs): 98.8 lbs Total Body Water (lbs): 68.6 lbs Visceral Fat Rating : 7   Other Clinical  Data Fasting: yes Labs: yes Today's Visit #: 19 Starting Date: 06/10/22 Comments: Cat 2     ASSESSMENT AND PLAN: Assessment & Plan Prediabetes Patient reports slightly increased indulgent eating over the last handful of weeks due to time constraints as well as stress.  She is anticipating her labs today to show slightly worsening levels.  Will repeat CMP, A1c and insulin  level today. Other hyperlipidemia LDL slightly increased in March of this year from labs previously done.  Patient has been working on dietary and lifestyle changes but reports the last month has been somewhat difficult and executing and staying consistent with these.  Repeat fasting lipid panel today and then risk stratify using ASCVD risk calculator at next appointment. Obesity with starting BMI of 34.0  BMI 25.0-25.9,adult  Hyperhidrosis Symptoms well-managed with Drysol.  Needs a refill of that medication today.  No side effects mentioned.  Will refill for 3 months.   Diet: [place] is currently in the action stage of change. As such, her goal is to continue with weight loss efforts and has agreed to the Category 2 Plan.   Exercise:  For substantial health benefits, adults should do at least 150 minutes (2 hours and 30 minutes) a week of moderate-intensity, or 75 minutes (1 hour  and 15 minutes) a week of vigorous-intensity aerobic physical activity, or an equivalent combination of moderate- and vigorous-intensity aerobic activity. Aerobic activity should be performed in episodes of at least 10 minutes, and preferably, it should be spread throughout the week.  Behavior Modification:  We discussed the following Behavioral Modification Strategies today: increasing lean protein intake, decreasing simple carbohydrates, increasing vegetables, meal planning and cooking strategies, keeping healthy foods in the home, and planning for success. We discussed various medication options to help [facility] with her weight loss efforts  and we both agreed to continue Wegovy  at current dose.  49-month prescription sent in..  Return in about 6 weeks (around 01/28/2024).   She was informed of the importance of frequent follow up visits to maximize her success with intensive lifestyle modifications for her multiple health conditions.  Attestation Statements:   Reviewed by clinician on day of visit: allergies, medications, problem list, medical history, surgical history, family history, social history, and previous encounter notes.    Adelita Cho, MD

## 2023-12-18 LAB — COMPREHENSIVE METABOLIC PANEL WITH GFR
ALT: 28 IU/L (ref 0–32)
AST: 21 IU/L (ref 0–40)
Albumin: 4.7 g/dL (ref 3.8–4.9)
Alkaline Phosphatase: 104 IU/L (ref 49–135)
BUN/Creatinine Ratio: 13 (ref 9–23)
BUN: 10 mg/dL (ref 6–24)
Bilirubin Total: 0.2 mg/dL (ref 0.0–1.2)
CO2: 22 mmol/L (ref 20–29)
Calcium: 9.9 mg/dL (ref 8.7–10.2)
Chloride: 103 mmol/L (ref 96–106)
Creatinine, Ser: 0.77 mg/dL (ref 0.57–1.00)
Globulin, Total: 3.1 g/dL (ref 1.5–4.5)
Glucose: 83 mg/dL (ref 70–99)
Potassium: 4.3 mmol/L (ref 3.5–5.2)
Sodium: 141 mmol/L (ref 134–144)
Total Protein: 7.8 g/dL (ref 6.0–8.5)
eGFR: 90 mL/min/1.73 (ref >59)

## 2023-12-18 LAB — LIPID PANEL WITH LDL/HDL RATIO
Cholesterol, Total: 214 mg/dL — ABNORMAL HIGH (ref 100–199)
HDL: 56 mg/dL (ref >39)
LDL Chol Calc (NIH): 135 mg/dL — ABNORMAL HIGH (ref 0–99)
LDL/HDL Ratio: 2.4 ratio (ref 0.0–3.2)
Triglycerides: 131 mg/dL (ref 0–149)
VLDL Cholesterol Cal: 23 mg/dL (ref 5–40)

## 2023-12-18 LAB — HEMOGLOBIN A1C
Est. average glucose Bld gHb Est-mCnc: 108 mg/dL
Hgb A1c MFr Bld: 5.4 % (ref 4.8–5.6)

## 2023-12-18 LAB — INSULIN, RANDOM: INSULIN: 6.1 u[IU]/mL (ref 2.6–24.9)

## 2024-01-11 ENCOUNTER — Encounter (INDEPENDENT_AMBULATORY_CARE_PROVIDER_SITE_OTHER): Payer: Self-pay | Admitting: Family Medicine

## 2024-01-28 ENCOUNTER — Encounter (INDEPENDENT_AMBULATORY_CARE_PROVIDER_SITE_OTHER): Payer: Self-pay | Admitting: Family Medicine

## 2024-01-28 ENCOUNTER — Ambulatory Visit (INDEPENDENT_AMBULATORY_CARE_PROVIDER_SITE_OTHER): Payer: Self-pay | Admitting: Family Medicine

## 2024-01-28 VITALS — BP 112/69 | HR 59 | Temp 97.9°F | Ht 64.0 in | Wt 147.0 lb

## 2024-01-28 DIAGNOSIS — R7303 Prediabetes: Secondary | ICD-10-CM

## 2024-01-28 DIAGNOSIS — Z6825 Body mass index (BMI) 25.0-25.9, adult: Secondary | ICD-10-CM | POA: Diagnosis not present

## 2024-01-28 DIAGNOSIS — E7849 Other hyperlipidemia: Secondary | ICD-10-CM

## 2024-01-28 DIAGNOSIS — E66811 Obesity, class 1: Secondary | ICD-10-CM

## 2024-01-28 DIAGNOSIS — E669 Obesity, unspecified: Secondary | ICD-10-CM | POA: Diagnosis not present

## 2024-01-28 NOTE — Progress Notes (Signed)
 SUBJECTIVE:  Chief Complaint: Obesity  Interim History: patient is dealing with difficulty with consistency in life in many different aspects.  She is trying to make herself a priority and is having a hard time doing this.  Work is always demanding and this time of year is particularly difficult.  She goes to two separate places for Thanksgiving and that is her norm.  For the month of December she is doing a holiday vacation to [place] the 13th- 19th.  She is already forseeing the challenges with herself for the next 8 weeks.   Sydney Alvarado is here to discuss her progress with her obesity treatment plan. She is on the Category 2 Plan and states she is following her eating plan approximately 50 % of the time. She states she is exercising 45 minutes 2 times per week.   OBJECTIVE: Visit Diagnoses: Problem List Items Addressed This Visit   None   Vitals Temp: 97.9 F (36.6 C) BP: 112/69 Pulse Rate: (!) 59 SpO2: 100 %   Anthropometric Measurements Height: 5' 4 (1.626 m) Weight: 147 lb (66.7 kg) BMI (Calculated): 25.22 Weight at Last Visit: 150 lb Weight Lost Since Last Visit: 3 Weight Gained Since Last Visit: 0 Starting Weight: 198 lb Total Weight Loss (lbs): 51 lb (23.1 kg)   Body Composition  Body Fat %: 31 % Fat Mass (lbs): 45.8 lbs Muscle Mass (lbs): 96.6 lbs Total Body Water (lbs): 68.4 lbs Visceral Fat Rating : 7   Other Clinical Data Today's Visit #: 20 Starting Date: 06/10/22 Comments: Cat 2     ASSESSMENT AND PLAN: Assessment & Plan Other hyperlipidemia The 10-year ASCVD risk score (Arnett DK, et al., 2019) is: 1.8%   Values used to calculate the score:     Age: 58 years     Clincally relevant sex: Female     Is Non-Hispanic African American: No     Diabetic: No     Tobacco smoker: No     Systolic Blood Pressure: 112 mmHg     Is BP treated: No     HDL Cholesterol: 56 mg/dL     Total Cholesterol: 214 mg/dL Labs from last appointment used to risk  stratify.  Still not at high enough risk for pharmacotherapy.  Will continue to work on dietary/ lifestyle changes. Prediabetes Blood sugar from last labs remains controlled.  Will continue current treatment plan- meal plan to stay the stay and pharmacotherapy will not change. Obesity with starting BMI of 34.0  BMI 25.0-25.9,adult    Diet: Sydney Alvarado is currently in the action stage of change. As such, her goal is to continue with weight loss efforts and has agreed to the Category 2 Plan.   Exercise:  For substantial health benefits, adults should do at least 150 minutes (2 hours and 30 minutes) a week of moderate-intensity, or 75 minutes (1 hour and 15 minutes) a week of vigorous-intensity aerobic physical activity, or an equivalent combination of moderate- and vigorous-intensity aerobic activity. Aerobic activity should be performed in episodes of at least 10 minutes, and preferably, it should be spread throughout the week.  Behavior Modification:  We discussed the following Behavioral Modification Strategies today: increasing lean protein intake, decreasing simple carbohydrates, increasing vegetables, meal planning and cooking strategies, and planning for success. We discussed various medication options to help [facility] with her weight loss efforts and we both agreed to continue Wegovy  at current doe of 2.4mg  weekly.  Return in about 8 weeks (around 03/24/2024).   She was  informed of the importance of frequent follow up visits to maximize her success with intensive lifestyle modifications for her multiple health conditions.  Attestation Statements:   Reviewed by clinician on day of visit: allergies, medications, problem list, medical history, surgical history, family history, social history, and previous encounter notes.    Sydney Cho, MD

## 2024-02-07 ENCOUNTER — Emergency Department (HOSPITAL_COMMUNITY)

## 2024-02-07 ENCOUNTER — Encounter (HOSPITAL_COMMUNITY): Payer: Self-pay

## 2024-02-07 ENCOUNTER — Other Ambulatory Visit: Payer: Self-pay

## 2024-02-07 ENCOUNTER — Emergency Department (HOSPITAL_COMMUNITY)
Admission: EM | Admit: 2024-02-07 | Discharge: 2024-02-08 | Disposition: A | Discharge status: Home / Self Care | Attending: Emergency Medicine | Admitting: Emergency Medicine

## 2024-02-07 DIAGNOSIS — W540XXA Bitten by dog, initial encounter: Secondary | ICD-10-CM | POA: Insufficient documentation

## 2024-02-07 DIAGNOSIS — T148XXA Other injury of unspecified body region, initial encounter: Secondary | ICD-10-CM

## 2024-02-07 DIAGNOSIS — S51831A Puncture wound without foreign body of right forearm, initial encounter: Secondary | ICD-10-CM | POA: Diagnosis present

## 2024-02-07 MED ORDER — SODIUM CHLORIDE 0.9 % IV SOLN
3.0000 g | Freq: Once | INTRAVENOUS | Status: AC
  Administered 2024-02-07: 3 g via INTRAVENOUS
  Filled 2024-02-07: qty 8

## 2024-02-07 MED ORDER — AMOXICILLIN-POT CLAVULANATE 875-125 MG PO TABS: 1.0000 | ORAL_TABLET | Freq: Two times a day (BID) | ORAL | 0 refills | Status: DC

## 2024-02-07 MED ORDER — AMOXICILLIN-POT CLAVULANATE 875-125 MG PO TABS: 1.0000 | ORAL_TABLET | Freq: Two times a day (BID) | ORAL | 0 refills | Status: AC

## 2024-02-07 NOTE — ED Provider Notes (Signed)
 Received patient in turnover from Dr. Randol.  Please see their note for further details of Hx, PE.  Briefly patient is a 58 y.o. female with a Animal Bite .  Puncture type fx.  Plan to discuss with ortho. I discussed case with Dr. Sebastian, orthopedics.  Based on my description of the history and exam, thought reasonable to give IV antibiotics here rinse out well and discharged home on oral antibiotics and follow-up in clinic.   Emil Share, DO 02/07/24 [id number]

## 2024-02-07 NOTE — ED Triage Notes (Addendum)
 Pt was breaking up a dog fight and was bitten to her right wrist/arm. Pt has multiple puncture wounds and was seen at [facility] today. [facility] completed at [facility]. Dog's shots up to date.

## 2024-02-07 NOTE — Discharge Instructions (Signed)
 Please call the hand surgery office tomorrow to set up an appointment.  Please take the antibiotics as prescribed.  Please return for redness drainage or if you develop a fever.

## 2024-02-07 NOTE — ED Provider Notes (Signed)
 Day EMERGENCY DEPARTMENT AT [facility] Provider Note   CSN: [id number] Arrival date & time: 02/07/24  2158     Patient presents with: Animal Bite   Sydney Alvarado is a 58 y.o. female.    Animal Bite    Patient presents ED for evaluation after being bitten by her dog.  Patient states her dogs were fighting and she tried to separate them.  They were German shepherd's.  Patient sustained bite wounds to her right forearm.  Patient's states she went to an urgent care today.  They sent her to the ED today to see if she needed suturing.  Patient's dogs are up-to-date on rabies vaccination.  Patient has been updated on [facility]  Prior to Admission medications   Medication Sig Start Date End Date Taking? Authorizing Provider  amoxicillin -clavulanate (AUGMENTIN ) 875-125 MG tablet Take 1 tablet by mouth every 12 (twelve) hours. 02/07/24  Yes Randol Simmonds, MD  acetaminophen  (TYLENOL ) 650 MG CR tablet Take 650 mg by mouth every 8 (eight) hours as needed for pain.    [provider]  aluminum chloride (DRYSOL) 20 % external solution Apply topically at bedtime. 12/17/23   [place] Adelita PENNER, MD  Calcium-Magnesium-Vitamin D  600-300-400 LIQD Take by mouth.    [provider]  cetirizine (ZYRTEC) 10 MG chewable tablet Chew 10 mg by mouth daily.    [provider]  Cholecalciferol (VITAMIN D ) 125 MCG (5000 UT) CAPS Take 5,000 Units by mouth daily.    [provider]  Multiple Vitamin (MULTIVITAMIN) capsule Take 1 capsule by mouth daily.    [provider]  Omega-3 1000 MG CAPS Take 1 capsule by mouth daily.    [provider]  Probiotic Product (PROBIOTIC DAILY PO) Take 1 capsule by mouth daily. Take in morning-Align    [provider]  psyllium (METAMUCIL) 58.6 % packet Take 1 packet by mouth daily.    [provider]  semaglutide -weight management (WEGOVY ) 2.4 MG/0.75ML SOAJ SQ injection Inject 2.4 mg into the skin  once a week. 12/17/23   [place] Adelita PENNER, MD  vitamin C (ASCORBIC ACID) 500 MG tablet Take 500 mg by mouth daily.    [provider]    Allergies: Sulfa antibiotics    Review of Systems  Updated Vital Signs BP (!) 164/81 (BP Location: Left Arm)   Pulse 73   Temp 98 F (36.7 C) (Oral)   Resp 19   SpO2 100%   Physical Exam Vitals and nursing note reviewed.  Constitutional:      General: She is not in acute distress.    Appearance: She is well-developed.  HENT:     Head: Normocephalic and atraumatic.     Right Ear: External ear normal.     Left Ear: External ear normal.  Eyes:     General: No scleral icterus.       Right eye: No discharge.        Left eye: No discharge.     Conjunctiva/sclera: Conjunctivae normal.  Neck:     Trachea: No tracheal deviation.  Cardiovascular:     Rate and Rhythm: Normal rate.  Pulmonary:     Effort: Pulmonary effort is normal. No respiratory distress.     Breath sounds: No stridor.  Abdominal:     General: There is no distension.  Musculoskeletal:        General: Tenderness present. No swelling or deformity.     Cervical back: Neck supple.  Comments: Several puncture wounds noted on the right forearm, slight oozing of blood, patient does have 1 slightly larger wound that is approximately 1 cm  Skin:    General: Skin is warm and dry.     Findings: No rash.  Neurological:     Mental Status: She is alert. Mental status is at baseline.     Cranial Nerves: No dysarthria or facial asymmetry.     Motor: No seizure activity.     (all labs ordered are listed, but only abnormal results are displayed) Labs Reviewed - No data to display  EKG: None  Radiology: DG Forearm Right Result Date: 02/07/2024 EXAM: 2 VIEW(S) XRAY OF THE RIGHT FOREARM 02/07/2024 10:49:00 PM COMPARISON: Wrist series today. CLINICAL HISTORY: dog bite FINDINGS: BONES AND JOINTS: Rounded lucencies in the distal radius compatible with puncture type  fractures. No joint dislocation. SOFT TISSUES: Soft tissue gas. IMPRESSION: 1. Rounded lucencies in the distal radius compatible with puncture-type fractures. 2. Soft tissue gas. Electronically signed by: Franky Crease MD 02/07/2024 10:56 PM EST RP Workstation: [id number]   DG Wrist Complete Right Result Date: 02/07/2024 EXAM: 3 OR MORE VIEW(S) XRAY OF THE RIGHT WRIST 02/07/2024 10:49:00 PM COMPARISON: None available. CLINICAL HISTORY: dog bite FINDINGS: BONES AND JOINTS: Rounded lucencies in the distal radius compatible with puncture type fractures. No focal osseous lesion. No joint dislocation. SOFT TISSUES: Soft tissue gas. IMPRESSION: 1. Puncture-type fractures in the distal radius. 2. Soft tissue gas consistent with open injury. Electronically signed by: Franky Crease MD 02/07/2024 10:55 PM EST RP Workstation: [id number]     Procedures   Medications Ordered in the ED  Ampicillin -Sulbactam (UNASYN ) 3 g in sodium chloride  0.9 % 100 mL IVPB (has no administration in time range)                                    Medical Decision Making Amount and/or Complexity of Data Reviewed Radiology: ordered.   Discussed with patient that I would not suture the wounds due to these of bite wounds.  We will irrigate the wounds with normal saline.  Will also apply Steri-Strips.  Patient reports he had x-rays suggesting possible injury to the bone.  Patient states those images would be available in Care Everywhere.  I do not see the x-ray report  X-rays obtained here that are consistent with cortical disruption of the bone from bite wound.  Have ordered a dose of Unasyn .  Plan is to consult with orthopedics to get their recommendation.  Care turned over to Dr Emil     Final diagnoses:  Puncture wound  Dog bite, initial encounter    ED Discharge Orders          Ordered    amoxicillin -clavulanate (AUGMENTIN ) 875-125 MG tablet  Every 12 hours        02/07/24 2308               Randol Simmonds,  MD 02/07/24 2308

## 2024-02-07 NOTE — ED Notes (Signed)
 Pt's wounds were irrigated, and wrapped with strei-strips and roller gauze.

## 2024-02-07 NOTE — Assessment & Plan Note (Signed)
 Blood sugar from last labs remains controlled.  Will continue current treatment plan- meal plan to stay the stay and pharmacotherapy will not change.

## 2024-03-24 ENCOUNTER — Ambulatory Visit (INDEPENDENT_AMBULATORY_CARE_PROVIDER_SITE_OTHER): Admitting: Family Medicine

## 2024-03-29 ENCOUNTER — Ambulatory Visit (INDEPENDENT_AMBULATORY_CARE_PROVIDER_SITE_OTHER): Admitting: Family Medicine

## 2024-03-29 VITALS — BP 150/78 | HR 62 | Temp 97.8°F | Ht 64.0 in | Wt 146.0 lb

## 2024-03-29 DIAGNOSIS — E669 Obesity, unspecified: Secondary | ICD-10-CM | POA: Diagnosis not present

## 2024-03-29 DIAGNOSIS — E66811 Obesity, class 1: Secondary | ICD-10-CM

## 2024-03-29 DIAGNOSIS — R7303 Prediabetes: Secondary | ICD-10-CM

## 2024-03-29 DIAGNOSIS — Z6825 Body mass index (BMI) 25.0-25.9, adult: Secondary | ICD-10-CM

## 2024-03-29 DIAGNOSIS — K760 Fatty (change of) liver, not elsewhere classified: Secondary | ICD-10-CM

## 2024-03-29 MED ORDER — SEMAGLUTIDE-WEIGHT MANAGEMENT 2.4 MG/0.75ML ~~LOC~~ SOAJ: 2.4000 mg | SUBCUTANEOUS | 0 refills | Status: AC

## 2024-03-29 NOTE — Assessment & Plan Note (Addendum)
 Has upcoming appointment for MRI of liver.  Will follow-up on imaging results after MRI is performed.  Patient is currently on semaglutide  which should aid in treatment of hepatic steatosis.

## 2024-03-29 NOTE — Progress Notes (Signed)
 "  SUBJECTIVE:  Chief Complaint: Obesity  Interim History: Patient has been busy and dealing with recuperating from a dog attack, dealing with flu in her house, work stress, holiday demands.  She has not done much during the holiday for herself.  She wants to work on decluttering her brain and house and be mindful.  Exercise has been hit or miss.  She feels like she has had to start from scratch meal plan wise because she has fallen into habits she did previously such as skipping meals.   Sydney Alvarado is here to discuss her progress with her obesity treatment plan. She is on the Category 2 Plan and states she is following her eating plan approximately 25 % of the time. She states she is not exercising, but was active.   OBJECTIVE: Visit Diagnoses: Problem List Items Addressed This Visit       Digestive   Hepatic steatosis     Other   Class 1 obesity with serious comorbidity and body mass index (BMI) of 34.0 to 34.9 in adult   Relevant Medications   semaglutide -weight management (WEGOVY ) 2.4 MG/0.75ML SOAJ SQ injection   Prediabetes - Primary   Other Visit Diagnoses       BMI 25.0-25.9,adult           Vitals Temp: 97.8 F (36.6 C) BP: (!) 150/78 Pulse Rate: 62 SpO2: 99 %   Anthropometric Measurements Height: 5' 4 (1.626 m) Weight: 146 lb (66.2 kg) BMI (Calculated): 25.05 Weight at Last Visit: 147 lb Weight Lost Since Last Visit: 1 Weight Gained Since Last Visit: 0 Starting Weight: 198 lb Total Weight Loss (lbs): 52 lb (23.6 kg)   Body Composition  Body Fat %: 31.3 % Fat Mass (lbs): 45.8 lbs Muscle Mass (lbs): 95.6 lbs Total Body Water (lbs): 69.4 lbs Visceral Fat Rating : 7   Other Clinical Data Today's Visit #: 21 Starting Date: 06/10/22 Comments: Cat 2     ASSESSMENT AND PLAN: Assessment & Plan Prediabetes Most recent A1c from October at goal of 5.4 with controlled insulin  of 6.1.  Patient is on semaglutide  for management of blood sugars, hepatic  steatosis, and obesity.  Will continue current treatment of semaglutide  with same dosage at this time. Hepatic steatosis Has upcoming appointment for MRI of liver.  Will follow-up on imaging results after MRI is performed.  Patient is currently on semaglutide  which should aid in treatment of hepatic steatosis. BMI 25.0-25.9,adult  Obesity with starting BMI of 34.0    Diet: Nixie is currently in the action stage of change. As such, her goal is to continue with weight loss efforts and has agreed to the Category 2 Plan.   Exercise:  For substantial health benefits, adults should do at least 150 minutes (2 hours and 30 minutes) a week of moderate-intensity, or 75 minutes (1 hour and 15 minutes) a week of vigorous-intensity aerobic physical activity, or an equivalent combination of moderate- and vigorous-intensity aerobic activity. Aerobic activity should be performed in episodes of at least 10 minutes, and preferably, it should be spread throughout the week.  Behavior Modification:  We discussed the following Behavioral Modification Strategies today: increasing lean protein intake, decreasing simple carbohydrates, increasing vegetables, meal planning and cooking strategies, keeping healthy foods in the home, and emotional eating strategies . We discussed various medication options to help [facility] with her weight loss efforts and we both agreed to continue Wegovy  at current dose of 2.4mg  for the next 3 months.  Return in about 4  weeks (around 04/26/2024).   She was informed of the importance of frequent follow up visits to maximize her success with intensive lifestyle modifications for her multiple health conditions.  Attestation Statements:   Reviewed by clinician on day of visit: allergies, medications, problem list, medical history, surgical history, family history, social history, and previous encounter notes.    Adelita Cho, MD "

## 2024-04-10 NOTE — Assessment & Plan Note (Signed)
 Most recent A1c from October at goal of 5.4 with controlled insulin  of 6.1.  Patient is on semaglutide  for management of blood sugars, hepatic steatosis, and obesity.  Will continue current treatment of semaglutide  with same dosage at this time.

## 2024-05-09 ENCOUNTER — Ambulatory Visit (INDEPENDENT_AMBULATORY_CARE_PROVIDER_SITE_OTHER): Admitting: Family Medicine
